# Patient Record
Sex: Female | Born: 1950 | Race: White | Hispanic: No | Marital: Married | Smoking: Current some day smoker
Health system: Southern US, Community
[De-identification: ages and names within clinical notes are randomized; demographics above are authoritative.]

## PROBLEM LIST (undated history)

## (undated) DIAGNOSIS — Z9221 Personal history of antineoplastic chemotherapy: Secondary | ICD-10-CM

## (undated) DIAGNOSIS — F419 Anxiety disorder, unspecified: Secondary | ICD-10-CM

## (undated) DIAGNOSIS — C73 Malignant neoplasm of thyroid gland: Secondary | ICD-10-CM

## (undated) DIAGNOSIS — M1990 Unspecified osteoarthritis, unspecified site: Secondary | ICD-10-CM

## (undated) DIAGNOSIS — G4700 Insomnia, unspecified: Secondary | ICD-10-CM

## (undated) DIAGNOSIS — F32A Depression, unspecified: Secondary | ICD-10-CM

## (undated) DIAGNOSIS — F329 Major depressive disorder, single episode, unspecified: Secondary | ICD-10-CM

## (undated) DIAGNOSIS — F1010 Alcohol abuse, uncomplicated: Secondary | ICD-10-CM

## (undated) DIAGNOSIS — C439 Malignant melanoma of skin, unspecified: Secondary | ICD-10-CM

## (undated) DIAGNOSIS — E079 Disorder of thyroid, unspecified: Secondary | ICD-10-CM

## (undated) HISTORY — DX: Malignant melanoma of skin, unspecified: C43.9

## (undated) HISTORY — DX: Unspecified osteoarthritis, unspecified site: M19.90

## (undated) HISTORY — DX: Malignant neoplasm of thyroid gland: C73

## (undated) HISTORY — PX: OTHER SURGICAL HISTORY: SHX169

---

## 1978-12-03 DIAGNOSIS — C439 Malignant melanoma of skin, unspecified: Secondary | ICD-10-CM

## 1978-12-03 HISTORY — DX: Malignant melanoma of skin, unspecified: C43.9

## 1978-12-03 HISTORY — PX: OTHER SURGICAL HISTORY: SHX169

## 1999-06-12 ENCOUNTER — Ambulatory Visit (HOSPITAL_COMMUNITY): Admission: RE | Admit: 1999-06-12 | Discharge: 1999-06-12 | Payer: Self-pay | Admitting: Psychiatry

## 1999-06-13 ENCOUNTER — Ambulatory Visit (HOSPITAL_COMMUNITY): Admission: RE | Admit: 1999-06-13 | Discharge: 1999-06-13 | Payer: Self-pay | Admitting: Psychiatry

## 1999-06-20 ENCOUNTER — Ambulatory Visit (HOSPITAL_COMMUNITY): Admission: RE | Admit: 1999-06-20 | Discharge: 1999-06-20 | Payer: Self-pay | Admitting: Psychiatry

## 1999-06-29 ENCOUNTER — Ambulatory Visit (HOSPITAL_COMMUNITY): Admission: RE | Admit: 1999-06-29 | Discharge: 1999-06-29 | Payer: Self-pay | Admitting: Psychiatry

## 1999-10-12 ENCOUNTER — Ambulatory Visit (HOSPITAL_COMMUNITY): Admission: RE | Admit: 1999-10-12 | Discharge: 1999-10-12 | Payer: Self-pay | Admitting: Psychiatry

## 1999-12-18 ENCOUNTER — Ambulatory Visit (HOSPITAL_COMMUNITY): Admission: RE | Admit: 1999-12-18 | Discharge: 1999-12-18 | Payer: Self-pay | Admitting: Psychiatry

## 2000-01-03 ENCOUNTER — Ambulatory Visit (HOSPITAL_COMMUNITY): Admission: RE | Admit: 2000-01-03 | Discharge: 2000-01-03 | Payer: Self-pay | Admitting: Psychiatry

## 2000-01-11 ENCOUNTER — Ambulatory Visit (HOSPITAL_COMMUNITY): Admission: RE | Admit: 2000-01-11 | Discharge: 2000-01-11 | Payer: Self-pay | Admitting: Psychiatry

## 2000-01-18 ENCOUNTER — Ambulatory Visit (HOSPITAL_COMMUNITY): Admission: RE | Admit: 2000-01-18 | Discharge: 2000-01-18 | Payer: Self-pay | Admitting: Psychiatry

## 2001-10-02 ENCOUNTER — Encounter: Admission: RE | Admit: 2001-10-02 | Discharge: 2001-10-02 | Payer: Self-pay | Admitting: Psychiatry

## 2001-10-21 ENCOUNTER — Encounter: Admission: RE | Admit: 2001-10-21 | Discharge: 2001-10-21 | Payer: Self-pay | Admitting: Psychiatry

## 2001-11-18 ENCOUNTER — Encounter: Admission: RE | Admit: 2001-11-18 | Discharge: 2001-11-18 | Payer: Self-pay | Admitting: Psychiatry

## 2002-03-10 ENCOUNTER — Encounter: Admission: RE | Admit: 2002-03-10 | Discharge: 2002-03-10 | Payer: Self-pay | Admitting: Psychiatry

## 2002-04-16 ENCOUNTER — Encounter: Admission: RE | Admit: 2002-04-16 | Discharge: 2002-04-16 | Payer: Self-pay | Admitting: Psychiatry

## 2002-04-22 ENCOUNTER — Encounter: Admission: RE | Admit: 2002-04-22 | Discharge: 2002-04-22 | Payer: Self-pay | Admitting: Psychiatry

## 2002-05-05 ENCOUNTER — Encounter: Admission: RE | Admit: 2002-05-05 | Discharge: 2002-05-05 | Payer: Self-pay | Admitting: Psychiatry

## 2002-06-29 ENCOUNTER — Encounter: Admission: RE | Admit: 2002-06-29 | Discharge: 2002-06-29 | Payer: Self-pay | Admitting: Psychiatry

## 2002-07-29 ENCOUNTER — Encounter: Admission: RE | Admit: 2002-07-29 | Discharge: 2002-07-29 | Payer: Self-pay | Admitting: Psychiatry

## 2002-09-24 ENCOUNTER — Encounter: Admission: RE | Admit: 2002-09-24 | Discharge: 2002-09-24 | Payer: Self-pay | Admitting: Psychiatry

## 2002-09-30 ENCOUNTER — Encounter: Admission: RE | Admit: 2002-09-30 | Discharge: 2002-09-30 | Payer: Self-pay | Admitting: Psychiatry

## 2002-10-19 ENCOUNTER — Encounter: Admission: RE | Admit: 2002-10-19 | Discharge: 2002-10-19 | Payer: Self-pay | Admitting: Psychiatry

## 2002-11-24 ENCOUNTER — Encounter: Admission: RE | Admit: 2002-11-24 | Discharge: 2002-11-24 | Payer: Self-pay | Admitting: Psychiatry

## 2002-12-14 ENCOUNTER — Encounter: Admission: RE | Admit: 2002-12-14 | Discharge: 2002-12-14 | Payer: Self-pay | Admitting: Psychiatry

## 2003-02-04 ENCOUNTER — Encounter: Admission: RE | Admit: 2003-02-04 | Discharge: 2003-02-04 | Payer: Self-pay | Admitting: Psychiatry

## 2003-03-09 ENCOUNTER — Encounter: Admission: RE | Admit: 2003-03-09 | Discharge: 2003-03-09 | Payer: Self-pay | Admitting: Psychiatry

## 2003-03-18 ENCOUNTER — Encounter: Admission: RE | Admit: 2003-03-18 | Discharge: 2003-03-18 | Payer: Self-pay | Admitting: Psychiatry

## 2003-04-07 ENCOUNTER — Encounter: Admission: RE | Admit: 2003-04-07 | Discharge: 2003-04-07 | Payer: Self-pay | Admitting: Psychiatry

## 2003-06-01 ENCOUNTER — Encounter: Admission: RE | Admit: 2003-06-01 | Discharge: 2003-06-01 | Payer: Self-pay | Admitting: Psychiatry

## 2003-07-07 ENCOUNTER — Encounter: Admission: RE | Admit: 2003-07-07 | Discharge: 2003-07-07 | Payer: Self-pay | Admitting: Psychiatry

## 2003-12-21 ENCOUNTER — Encounter: Admission: RE | Admit: 2003-12-21 | Discharge: 2003-12-21 | Payer: Self-pay | Admitting: Psychiatry

## 2004-08-28 ENCOUNTER — Ambulatory Visit (HOSPITAL_COMMUNITY): Payer: Self-pay | Admitting: Psychiatry

## 2004-09-04 ENCOUNTER — Ambulatory Visit (HOSPITAL_COMMUNITY): Payer: Self-pay | Admitting: Psychiatry

## 2004-09-11 ENCOUNTER — Ambulatory Visit (HOSPITAL_COMMUNITY): Payer: Self-pay | Admitting: Psychiatry

## 2004-09-18 ENCOUNTER — Ambulatory Visit (HOSPITAL_COMMUNITY): Payer: Self-pay | Admitting: Psychiatry

## 2004-09-25 ENCOUNTER — Ambulatory Visit (HOSPITAL_COMMUNITY): Payer: Self-pay | Admitting: Psychiatry

## 2004-10-16 ENCOUNTER — Ambulatory Visit (HOSPITAL_COMMUNITY): Payer: Self-pay | Admitting: Psychiatry

## 2004-10-23 ENCOUNTER — Ambulatory Visit (HOSPITAL_COMMUNITY): Payer: Self-pay | Admitting: Psychiatry

## 2004-11-13 ENCOUNTER — Ambulatory Visit (HOSPITAL_COMMUNITY): Payer: Self-pay | Admitting: Psychiatry

## 2005-02-12 ENCOUNTER — Ambulatory Visit (HOSPITAL_COMMUNITY): Payer: Self-pay | Admitting: Psychiatry

## 2005-04-17 ENCOUNTER — Ambulatory Visit (HOSPITAL_COMMUNITY): Payer: Self-pay | Admitting: Psychiatry

## 2005-09-04 ENCOUNTER — Ambulatory Visit (HOSPITAL_COMMUNITY): Payer: Self-pay | Admitting: Psychiatry

## 2005-09-07 ENCOUNTER — Ambulatory Visit (HOSPITAL_COMMUNITY): Payer: Self-pay | Admitting: Psychiatry

## 2005-09-11 ENCOUNTER — Ambulatory Visit (HOSPITAL_COMMUNITY): Payer: Self-pay | Admitting: Psychiatry

## 2005-09-17 ENCOUNTER — Ambulatory Visit (HOSPITAL_COMMUNITY): Payer: Self-pay | Admitting: Psychiatry

## 2005-12-13 ENCOUNTER — Ambulatory Visit (HOSPITAL_COMMUNITY): Payer: Self-pay | Admitting: Psychiatry

## 2005-12-20 ENCOUNTER — Ambulatory Visit (HOSPITAL_COMMUNITY): Payer: Self-pay | Admitting: Psychiatry

## 2005-12-25 ENCOUNTER — Ambulatory Visit (HOSPITAL_COMMUNITY): Payer: Self-pay | Admitting: Psychiatry

## 2006-01-02 ENCOUNTER — Ambulatory Visit (HOSPITAL_COMMUNITY): Payer: Self-pay | Admitting: Psychiatry

## 2006-03-06 ENCOUNTER — Ambulatory Visit (HOSPITAL_COMMUNITY): Payer: Self-pay | Admitting: Psychiatry

## 2006-03-19 ENCOUNTER — Ambulatory Visit (HOSPITAL_COMMUNITY): Payer: Self-pay | Admitting: Psychiatry

## 2006-07-18 ENCOUNTER — Ambulatory Visit (HOSPITAL_COMMUNITY): Payer: Self-pay | Admitting: Psychiatry

## 2006-09-11 ENCOUNTER — Ambulatory Visit (HOSPITAL_COMMUNITY): Payer: Self-pay | Admitting: Psychiatry

## 2006-12-11 ENCOUNTER — Ambulatory Visit (HOSPITAL_COMMUNITY): Payer: Self-pay | Admitting: Psychiatry

## 2006-12-30 ENCOUNTER — Ambulatory Visit (HOSPITAL_COMMUNITY): Payer: Self-pay | Admitting: Psychiatry

## 2007-03-27 ENCOUNTER — Ambulatory Visit (HOSPITAL_COMMUNITY): Payer: Self-pay | Admitting: Psychiatry

## 2007-05-02 ENCOUNTER — Ambulatory Visit (HOSPITAL_COMMUNITY): Payer: Self-pay | Admitting: Psychiatry

## 2007-10-28 ENCOUNTER — Ambulatory Visit (HOSPITAL_COMMUNITY): Payer: Self-pay | Admitting: Psychiatry

## 2007-11-25 ENCOUNTER — Ambulatory Visit (HOSPITAL_COMMUNITY): Payer: Self-pay | Admitting: Psychiatry

## 2008-02-18 ENCOUNTER — Ambulatory Visit (HOSPITAL_COMMUNITY): Payer: Self-pay | Admitting: Psychiatry

## 2008-03-01 ENCOUNTER — Ambulatory Visit (HOSPITAL_COMMUNITY): Payer: Self-pay | Admitting: Licensed Clinical Social Worker

## 2008-03-16 ENCOUNTER — Ambulatory Visit (HOSPITAL_COMMUNITY): Payer: Self-pay | Admitting: Licensed Clinical Social Worker

## 2008-03-29 ENCOUNTER — Ambulatory Visit (HOSPITAL_COMMUNITY): Payer: Self-pay | Admitting: Licensed Clinical Social Worker

## 2008-04-05 ENCOUNTER — Ambulatory Visit (HOSPITAL_COMMUNITY): Payer: Self-pay | Admitting: Psychiatry

## 2008-04-13 ENCOUNTER — Ambulatory Visit (HOSPITAL_COMMUNITY): Payer: Self-pay | Admitting: Licensed Clinical Social Worker

## 2008-05-05 ENCOUNTER — Ambulatory Visit (HOSPITAL_COMMUNITY): Payer: Self-pay | Admitting: Licensed Clinical Social Worker

## 2008-05-11 ENCOUNTER — Ambulatory Visit (HOSPITAL_COMMUNITY): Payer: Self-pay | Admitting: Licensed Clinical Social Worker

## 2008-05-25 ENCOUNTER — Ambulatory Visit (HOSPITAL_COMMUNITY): Payer: Self-pay | Admitting: Licensed Clinical Social Worker

## 2008-06-09 ENCOUNTER — Ambulatory Visit (HOSPITAL_COMMUNITY): Payer: Self-pay | Admitting: Psychiatry

## 2008-06-23 ENCOUNTER — Ambulatory Visit (HOSPITAL_COMMUNITY): Payer: Self-pay | Admitting: Licensed Clinical Social Worker

## 2008-06-25 ENCOUNTER — Ambulatory Visit (HOSPITAL_COMMUNITY): Payer: Self-pay | Admitting: Psychiatry

## 2008-07-05 ENCOUNTER — Ambulatory Visit (HOSPITAL_COMMUNITY): Payer: Self-pay | Admitting: Licensed Clinical Social Worker

## 2008-07-19 ENCOUNTER — Ambulatory Visit (HOSPITAL_COMMUNITY): Payer: Self-pay | Admitting: Licensed Clinical Social Worker

## 2008-07-28 ENCOUNTER — Ambulatory Visit (HOSPITAL_COMMUNITY): Payer: Self-pay | Admitting: Licensed Clinical Social Worker

## 2008-08-11 ENCOUNTER — Ambulatory Visit (HOSPITAL_COMMUNITY): Payer: Self-pay | Admitting: Licensed Clinical Social Worker

## 2008-09-01 ENCOUNTER — Ambulatory Visit (HOSPITAL_COMMUNITY): Payer: Self-pay | Admitting: Licensed Clinical Social Worker

## 2008-09-20 ENCOUNTER — Ambulatory Visit (HOSPITAL_COMMUNITY): Payer: Self-pay | Admitting: Licensed Clinical Social Worker

## 2008-10-20 ENCOUNTER — Ambulatory Visit (HOSPITAL_COMMUNITY): Payer: Self-pay | Admitting: Licensed Clinical Social Worker

## 2008-11-03 ENCOUNTER — Ambulatory Visit (HOSPITAL_COMMUNITY): Payer: Self-pay | Admitting: Licensed Clinical Social Worker

## 2011-06-07 ENCOUNTER — Ambulatory Visit (INDEPENDENT_AMBULATORY_CARE_PROVIDER_SITE_OTHER): Payer: BC Managed Care – PPO | Admitting: General Surgery

## 2011-06-07 ENCOUNTER — Encounter (INDEPENDENT_AMBULATORY_CARE_PROVIDER_SITE_OTHER): Payer: Self-pay | Admitting: General Surgery

## 2011-06-07 VITALS — BP 114/62 | HR 70 | Ht 65.0 in | Wt 140.0 lb

## 2011-06-07 DIAGNOSIS — R229 Localized swelling, mass and lump, unspecified: Secondary | ICD-10-CM

## 2011-06-07 DIAGNOSIS — R222 Localized swelling, mass and lump, trunk: Secondary | ICD-10-CM | POA: Insufficient documentation

## 2011-06-07 NOTE — Patient Instructions (Signed)
We will schedule for surgery tomorrow with Dr. [name]

## 2011-06-07 NOTE — Progress Notes (Signed)
Subjective:     Patient ID: Susan Mccarthy, female   DOB: 07/05/1951, 60 y.o.   MRN: [id number]    BP 114/62  Pulse 70  Ht 5\' 5"  (1.651 m)  Wt 140 lb (63.504 kg)  BMI 23.30 kg/m2    HPI This is a 60 year old female with a history of a back melanoma in 1980. She has a history she reports of a fungal infection on her back it has been treated. She has a month's history of a mass on her back that has been increasingly irritating to her. She notes some material that comes off of this is in her bowel as well as on her clothes. Lately she been having some bleeding from this area. This was hit by her dog recently also it is causing her pain since then. She was evaluated by Dr. Leta Speller yesterday and he noted this to be a very friable large mass not really amenable to a biopsy that just needed to be addressed surgically. There is no real relieving factors. This area soaked through a bandage the Dr. Londell Moh the office put on yesterday already. Past Medical History  Diagnosis Date  . Melanoma 1980    Past Surgical History  Procedure Date  . Excision of melanoma 1980   Current outpatient prescriptions:cetirizine (ZYRTEC) 5 MG tablet, Take 5 mg by mouth daily.  , Disp: , Rfl: ;  ibuprofen (ADVIL,MOTRIN) 100 MG chewable tablet, Chew 100 mg by mouth every 8 (eight) hours as needed.  , Disp: , Rfl:   No Known Allergies   Review of Systems  Constitutional: Negative.   HENT: Negative.   Eyes: Negative.   Respiratory: Negative.   Cardiovascular: Negative.   Gastrointestinal: Negative.   Genitourinary: Negative.   Musculoskeletal: Negative.   Neurological: Positive for speech difficulty.  Hematological: Positive for adenopathy.       Adenopathy in right axilla with swelling  Psychiatric/Behavioral: Negative.        Objective:   Physical Exam  Constitutional: She appears well-developed and well-nourished.  Neck: Neck supple.  Cardiovascular: Normal rate, regular rhythm and normal heart  sounds.   Pulmonary/Chest: Effort normal and breath sounds normal. She has no wheezes. She has no rales.       Palpable mobile nontender right axillary adenopathy, no left axillary adenopathy palpated.  She has a 2x3 inch necrotic bleeding back mass with some surrounding erythema.  Lymphadenopathy:    She has no cervical adenopathy.       Assessment:     Back mass, concern for tumor Right axillary lymphadenopathy    Plan:        She has a large necrotic back mass that is currently bleeding mildly. It is better with a dressing on it but I don't think that this is going to be able to wait to go to the operating room. I'm concerned about what this is as we have no tissue diagnosis as well as I'm concerned about her axillary adenopathy. I think that's going to have to be evaluated at a later date. She really urgently in the next 24 hours needs to go to the operating room at this mass excised given the symptoms and its appearance right now. I'm unable to do this so I had Dr. [name] come see her today and he is going to plan on doing this tomorrow morning. I discussed with her an excision of this mass likely with a primary closure. Based on the pathology of this she we  will then proceed with evaluating her axilla as well as any other further treatment since she might need.

## 2011-06-08 ENCOUNTER — Other Ambulatory Visit (INDEPENDENT_AMBULATORY_CARE_PROVIDER_SITE_OTHER): Payer: Self-pay | Admitting: Surgery

## 2011-06-08 ENCOUNTER — Ambulatory Visit (HOSPITAL_COMMUNITY)
Admission: RE | Admit: 2011-06-08 | Discharge: 2011-06-08 | Disposition: A | Discharge status: Home / Self Care | Payer: BC Managed Care – PPO | Source: Ambulatory Visit | Attending: Surgery | Admitting: Surgery

## 2011-06-08 ENCOUNTER — Ambulatory Visit (HOSPITAL_COMMUNITY): Payer: BC Managed Care – PPO

## 2011-06-08 DIAGNOSIS — Z01818 Encounter for other preprocedural examination: Secondary | ICD-10-CM | POA: Insufficient documentation

## 2011-06-08 DIAGNOSIS — C4359 Malignant melanoma of other part of trunk: Secondary | ICD-10-CM | POA: Insufficient documentation

## 2011-06-08 DIAGNOSIS — R599 Enlarged lymph nodes, unspecified: Secondary | ICD-10-CM | POA: Insufficient documentation

## 2011-06-08 LAB — BASIC METABOLIC PANEL
Calcium: 9.7 mg/dL (ref 8.4–10.5)
Creatinine, Ser: 0.59 mg/dL (ref 0.50–1.10)
GFR calc Af Amer: >60 mL/min (ref >60)

## 2011-06-08 LAB — CBC
MCH: 30.6 pg (ref 26.0–34.0)
MCV: 89.7 fL (ref 78.0–100.0)
Platelets: 205 10*3/uL (ref 150–400)
RDW: 12.8 % (ref 11.5–15.5)
WBC: 5.5 10*3/uL (ref 4.0–10.5)

## 2011-06-08 LAB — DIFFERENTIAL
Eosinophils Absolute: 0.2 10*3/uL (ref 0.0–0.7)
Eosinophils Relative: 4 % (ref 0–5)
Lymphs Abs: 1 10*3/uL (ref 0.7–4.0)
Monocytes Relative: 13 % — ABNORMAL HIGH (ref 3–12)

## 2011-06-08 LAB — SURGICAL PCR SCREEN
MRSA, PCR: NEGATIVE
Staphylococcus aureus: NEGATIVE

## 2011-06-11 NOTE — Op Note (Signed)
Susan Mccarthy, Susan Mccarthy NO.:  [id number]  MEDICAL RECORD NO.:  [id number]  LOCATION:  [facility]                         FACILITY:  [facility]  PHYSICIAN:  Currie Paris, M.D.DATE OF BIRTH:  21-Sep-1951  DATE OF PROCEDURE:  06/08/2011 DATE OF DISCHARGE:                              OPERATIVE REPORT   PREOPERATIVE DIAGNOSIS:  Fungating mass, mid right back, probable melanoma.  POSTOPERATIVE DIAGNOSIS:  Fungating mass, mid right back, probable melanoma.  PROCEDURE:  Excision, fungating mass of back, with layered closure (7-cm incision).  SURGEON:  Currie Paris, M.D.  ANESTHESIA:  General.  CLINICAL HISTORY:  This is a 60 year old lady who was sent to our office yesterday by Dr. Londell Moh with a large fungating mass on the back that had been bleeding and was such that she could no longer lie on it. There was a mass about 5 x 3 cm, raised up about 3 cm from the skin. There was a small similar looking nodule that was about 5 to 7 mm in size.  There is a flat black area just below this, suggestive of melanoma.  The mass was somewhat pedunculated.  Because of the ongoing bleeding and discomfort, we elected to go ahead and excise this primarily.  There was a several centimeter area of redness around it that I was concerned, represented melanoma, as was Dr. [name].  However, we thought that a palliative resection, give [place] the opportunity get a pathology report would allow [place] to make more definitive plans once we had gotten the median symptomatology relieved. Of note is that she had what felt like a metastatic disease in her right axilla.  DESCRIPTION OF PROCEDURE:  I saw the patient in the holding area and she had no further questions.  I marked the area in question.  The patient was taken to the operating room and after satisfactory general anesthesia had been obtained, she was placed on the operating room table in the prone position and the area around  the mass was prepped and draped.  A time-out was done.  I made a long elliptical incision, trying to get around the mass.  As noted, it was somewhat pediculated and the base was a little bit narrower but I ended up taking an area of skin about 3 cm wide and 7 cm long.  I went down to the fascia and took this out basically with cautery after the skin incision was made with the knife.  I oriented the specimen with some sutures and sent it to pathology.  I spent several minutes making sure everything was dry.  I raised some skin flaps superiorly and inferiorly, so that we would take a little bit tension off the closure.  I then injected about 30 mL of 0.25% plain Marcaine to help with postop pain relief.  I then closed in layers with some 3-0 Vicryl interrupted to try to take the tension off and then 3-0 Prolene vertical mattress sutures for the skin.  Sterile dressings were applied.  The patient tolerated the procedure well.  There were no operative complications.  All counts were correct.  We will send her home from outpatient today and she will  be followed up by Dr. [name] in our office next week.     Currie Paris, M.D.     CJS/MEDQ  D:  06/08/2011  T:  06/08/2011  Job:  [id number]  cc:   Georgina Quint. Plotnikov, MD [address] [place] [address]  Norval Gable. [place], M.D. Fax: [phone]  Electronically Signed by Cyndia Bent M.D. on 06/11/2011 09:48:36 AM

## 2011-06-18 ENCOUNTER — Encounter (INDEPENDENT_AMBULATORY_CARE_PROVIDER_SITE_OTHER): Payer: BC Managed Care – PPO

## 2011-06-19 ENCOUNTER — Ambulatory Visit (INDEPENDENT_AMBULATORY_CARE_PROVIDER_SITE_OTHER): Payer: BC Managed Care – PPO | Admitting: General Surgery

## 2011-06-19 ENCOUNTER — Telehealth: Payer: Self-pay | Admitting: Internal Medicine

## 2011-06-19 DIAGNOSIS — C4359 Malignant melanoma of other part of trunk: Secondary | ICD-10-CM

## 2011-06-19 NOTE — Progress Notes (Signed)
Subjective:     Patient ID: Susan Mccarthy, female   DOB: Nov 07, 1951, 60 y.o.   MRN: [id number]  HPI This is a 60 year old female with a history of back melanoma in 1980. She presented to me in early July. She had a large fungating bleeding mass on her back at that point. She was taken to the operating room the following day by Dr. [name]. She just done well since then without any complaints from the operation. She does however have a 6 cm superficial spreading malignant melanoma with a 1.1 cm macro satellite nodule. There is a few lymphatic invasion identified in the morning this is 3 mm inferiorly. She also had palpable right axillary adenopathy on my initial examination it has not been evaluated at this point either.  Review of Systems     Objective:   Physical Exam Well healing transverse back incision s/p surgery by Dr. 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 I discussed with her I think the next step would be staging her completely. Her margins are not adequate for a T4 melanoma. She also has palpable axillary adenopathy which needs to be evaluated. I think the next best step is to be sure she does not have metastatic disease. I discussed this with Dr. [name]. He is going to see her this week. I ordered a PET scan as well as an MRI of the brain per his recommendations for baseline staging. She also inquired about going back to [facility] for evaluation and a possible second opinion. I told her that I thought that I was perfectly reasonable as well as to discuss any trials for which he might be a candidate for. On the week here what her response is on this. I will hold on any further surgery at this point until we have the staging and a medical oncology opinion first.

## 2011-06-19 NOTE — Telephone Encounter (Signed)
Susan Mccarthy called to say his wife is using Dr. Loren Racer name as a pcp with the hospital and her cancer doctors.  He says she used to see Dr. Posey Rea, but has not been seen since before 2005 (no appts in [facility] or [facility])  Susan Mccarthy wants her to be re-established with Dr. Posey Rea.  She has [employer].  Will you take her as a patient?

## 2011-06-19 NOTE — Telephone Encounter (Signed)
OK. Thx

## 2011-06-20 ENCOUNTER — Ambulatory Visit: Payer: BC Managed Care – PPO | Admitting: Hematology & Oncology

## 2011-06-21 ENCOUNTER — Other Ambulatory Visit: Payer: Self-pay | Admitting: Hematology & Oncology

## 2011-06-21 ENCOUNTER — Ambulatory Visit (HOSPITAL_BASED_OUTPATIENT_CLINIC_OR_DEPARTMENT_OTHER): Payer: BC Managed Care – PPO | Admitting: Hematology & Oncology

## 2011-06-21 ENCOUNTER — Telehealth (INDEPENDENT_AMBULATORY_CARE_PROVIDER_SITE_OTHER): Payer: Self-pay

## 2011-06-21 ENCOUNTER — Encounter (HOSPITAL_COMMUNITY): Payer: Self-pay

## 2011-06-21 ENCOUNTER — Encounter (HOSPITAL_COMMUNITY)
Admission: RE | Admit: 2011-06-21 | Discharge: 2011-06-21 | Disposition: A | Discharge status: Home / Self Care | Payer: BC Managed Care – PPO | Source: Ambulatory Visit | Attending: General Surgery | Admitting: General Surgery

## 2011-06-21 DIAGNOSIS — C4359 Malignant melanoma of other part of trunk: Secondary | ICD-10-CM

## 2011-06-21 LAB — COMPREHENSIVE METABOLIC PANEL
ALT: 21 U/L (ref 0–35)
AST: 23 U/L (ref 0–37)
Calcium: 9.6 mg/dL (ref 8.4–10.5)
Chloride: 98 mEq/L (ref 96–112)
Creatinine, Ser: 0.69 mg/dL (ref 0.50–1.10)
Sodium: 132 mEq/L — ABNORMAL LOW (ref 135–145)
Total Protein: 6.8 g/dL (ref 6.0–8.3)

## 2011-06-21 LAB — CBC WITH DIFFERENTIAL (CANCER CENTER ONLY)
BASO%: 0.9 % (ref 0.0–2.0)
Eosinophils Absolute: 0.3 10*3/uL (ref 0.0–0.5)
LYMPH#: 1.3 10*3/uL (ref 0.9–3.3)
LYMPH%: 23.2 % (ref 14.0–48.0)
MCV: 88 fL (ref 81–101)
MONO#: 0.7 10*3/uL (ref 0.1–0.9)
NEUT#: 3.4 10*3/uL (ref 1.5–6.5)
Platelets: 228 10*3/uL (ref 145–400)
RBC: 4.56 10*6/uL (ref 3.70–5.32)
RDW: 12.6 % (ref 11.1–15.7)
WBC: 5.7 10*3/uL (ref 3.9–10.0)

## 2011-06-21 LAB — GLUCOSE, CAPILLARY: Glucose-Capillary: 120 mg/dL — ABNORMAL HIGH (ref 70–99)

## 2011-06-21 MED ORDER — FLUDEOXYGLUCOSE F - 18 (FDG) INJECTION
17.5000 | Freq: Once | INTRAVENOUS | Status: AC | PRN
  Administered 2011-06-21: 17.5 via INTRAVENOUS

## 2011-06-21 NOTE — Telephone Encounter (Signed)
Pt calling to request Rx for Valium 5mg  #1 to take before her MRI Brain scan on 06-22-11. Per DR [name] to call Valium 5mg  #1 take 1hr before xray and Do Not Drive on Valium phoned to [phone] per Dr [name] [facility] 06-21-11

## 2011-06-22 ENCOUNTER — Telehealth (INDEPENDENT_AMBULATORY_CARE_PROVIDER_SITE_OTHER): Payer: Self-pay

## 2011-06-22 ENCOUNTER — Ambulatory Visit (HOSPITAL_COMMUNITY)
Admission: RE | Admit: 2011-06-22 | Discharge: 2011-06-22 | Disposition: A | Discharge status: Home / Self Care | Payer: BC Managed Care – PPO | Source: Ambulatory Visit | Attending: General Surgery | Admitting: General Surgery

## 2011-06-22 DIAGNOSIS — C4359 Malignant melanoma of other part of trunk: Secondary | ICD-10-CM | POA: Insufficient documentation

## 2011-06-22 MED ORDER — GADOBENATE DIMEGLUMINE 529 MG/ML IV SOLN
13.0000 mL | Freq: Once | INTRAVENOUS | Status: AC
  Administered 2011-06-22: 13 mL via INTRAVENOUS

## 2011-06-22 NOTE — Telephone Encounter (Signed)
I received pt's xray reports on the PET scan and the MRI Brain scan so I faxed them to Dr Gustavo Lah office this pm/ [facility]

## 2011-06-29 ENCOUNTER — Telehealth (INDEPENDENT_AMBULATORY_CARE_PROVIDER_SITE_OTHER): Payer: Self-pay

## 2011-06-29 NOTE — Telephone Encounter (Signed)
I called Dr Gustavo Lah office to see what the plan was going to be for our pt with her care. The pt has decided to get a 2nd opinion @ [facility] so Ellen-Dr Ennever's nurse made the referral to Dr [name] @[facility] ./ [facility]

## 2011-07-09 ENCOUNTER — Encounter (INDEPENDENT_AMBULATORY_CARE_PROVIDER_SITE_OTHER): Payer: BC Managed Care – PPO | Admitting: General Surgery

## 2011-07-19 NOTE — Telephone Encounter (Signed)
Pt is aware. She has an appt for Sept. 13, 2012.

## 2011-08-02 HISTORY — PX: AXILLARY NODE DISSECTION: SHX1211

## 2011-08-16 ENCOUNTER — Telehealth: Payer: Self-pay | Admitting: Internal Medicine

## 2011-08-16 ENCOUNTER — Other Ambulatory Visit (INDEPENDENT_AMBULATORY_CARE_PROVIDER_SITE_OTHER): Payer: BC Managed Care – PPO

## 2011-08-16 ENCOUNTER — Ambulatory Visit (INDEPENDENT_AMBULATORY_CARE_PROVIDER_SITE_OTHER): Payer: BC Managed Care – PPO | Admitting: Internal Medicine

## 2011-08-16 ENCOUNTER — Encounter: Payer: Self-pay | Admitting: Internal Medicine

## 2011-08-16 VITALS — BP 122/80 | HR 88 | Temp 98.8°F | Resp 16 | Ht 65.0 in | Wt 139.0 lb

## 2011-08-16 DIAGNOSIS — C4359 Malignant melanoma of other part of trunk: Secondary | ICD-10-CM

## 2011-08-16 DIAGNOSIS — R071 Chest pain on breathing: Secondary | ICD-10-CM

## 2011-08-16 DIAGNOSIS — R0789 Other chest pain: Secondary | ICD-10-CM

## 2011-08-16 DIAGNOSIS — F329 Major depressive disorder, single episode, unspecified: Secondary | ICD-10-CM

## 2011-08-16 DIAGNOSIS — F32A Depression, unspecified: Secondary | ICD-10-CM

## 2011-08-16 DIAGNOSIS — F419 Anxiety disorder, unspecified: Secondary | ICD-10-CM

## 2011-08-16 DIAGNOSIS — G4700 Insomnia, unspecified: Secondary | ICD-10-CM

## 2011-08-16 DIAGNOSIS — F411 Generalized anxiety disorder: Secondary | ICD-10-CM

## 2011-08-16 LAB — CBC WITH DIFFERENTIAL/PLATELET
Basophils Absolute: 0 10*3/uL (ref 0.0–0.1)
Eosinophils Absolute: 0.2 10*3/uL (ref 0.0–0.7)
HCT: 42.1 % (ref 36.0–46.0)
Hemoglobin: 14.3 g/dL (ref 12.0–15.0)
Lymphocytes Relative: 21.3 % (ref 12.0–46.0)
Lymphs Abs: 1.5 10*3/uL (ref 0.7–4.0)
MCHC: 33.9 g/dL (ref 30.0–36.0)
Neutro Abs: 4.9 10*3/uL (ref 1.4–7.7)
Platelets: 218 10*3/uL (ref 150.0–400.0)
RDW: 14.1 % (ref 11.5–14.6)

## 2011-08-16 LAB — URINALYSIS
Bilirubin Urine: NEGATIVE
Hgb urine dipstick: NEGATIVE
Leukocytes, UA: NEGATIVE
Nitrite: NEGATIVE
Specific Gravity, Urine: 1.01
Total Protein, Urine: NEGATIVE
Urine Glucose: NEGATIVE
Urobilinogen, UA: 0.2
pH: 7 (ref 5.0–8.0)

## 2011-08-16 LAB — COMPREHENSIVE METABOLIC PANEL WITH GFR
ALT: 15 U/L (ref 0–35)
AST: 20 U/L (ref 0–37)
Albumin: 4.1 g/dL (ref 3.5–5.2)
Alkaline Phosphatase: 83 U/L (ref 39–117)
BUN: 13 mg/dL (ref 6–23)
CO2: 27 meq/L (ref 19–32)
Calcium: 9.5 mg/dL (ref 8.4–10.5)
Chloride: 102 meq/L (ref 96–112)
Creatinine, Ser: 0.7 mg/dL (ref 0.4–1.2)
GFR: 91.46 mL/min
Glucose, Bld: 122 mg/dL — ABNORMAL HIGH (ref 70–99)
Potassium: 4.9 meq/L (ref 3.5–5.1)
Sodium: 137 meq/L (ref 135–145)
Total Bilirubin: 0.7 mg/dL (ref 0.3–1.2)
Total Protein: 7 g/dL (ref 6.0–8.3)

## 2011-08-16 LAB — VITAMIN B12: Vitamin B-12: 150 pg/mL — ABNORMAL LOW (ref 211–911)

## 2011-08-16 MED ORDER — ALPRAZOLAM 0.25 MG PO TABS: 0.2500 mg | ORAL_TABLET | Freq: Two times a day (BID) | ORAL | Status: DC | PRN

## 2011-08-16 MED ORDER — VITAMIN D 1000 UNITS PO TABS: 1000.0000 [IU] | ORAL_TABLET | Freq: Every day | ORAL | Status: AC

## 2011-08-16 MED ORDER — FLUOXETINE HCL 20 MG PO CAPS: 20.0000 mg | ORAL_CAPSULE | Freq: Every day | ORAL | Status: DC

## 2011-08-16 NOTE — Assessment & Plan Note (Addendum)
S/p surg 06/09/11, 07/28/11 Get labs

## 2011-08-16 NOTE — Assessment & Plan Note (Signed)
Xanax prn 

## 2011-08-16 NOTE — Progress Notes (Signed)
  Subjective:    Patient ID: Susan Mccarthy, female    DOB: [birth year], 60 y.o.   MRN: [id number]  HPI  New pt C/o skin ca - melanoma dx'd in 7/12 C/o anxiety, depression - severe. C/o stress at home. C/o insomnia - worse lately. C/o R axilla pain 10/10 w/activity irrad to front and helped w/oxycod  Review of Systems  Constitutional: Positive for unexpected weight change (wt gain). Negative for chills, activity change, appetite change and fatigue.  HENT: Negative for congestion, mouth sores and sinus pressure.   Eyes: Negative for visual disturbance.  Respiratory: Negative for cough and chest tightness.   Gastrointestinal: Negative for nausea and abdominal pain.  Genitourinary: Negative for frequency, difficulty urinating and vaginal pain.  Musculoskeletal: Negative for back pain and gait problem.  Skin: Positive for wound (R axilla drain). Negative for pallor and rash.  Neurological: Negative for dizziness, tremors, weakness, numbness and headaches.  Psychiatric/Behavioral: Positive for behavioral problems and agitation. Negative for suicidal ideas, confusion, sleep disturbance and self-injury. The patient is nervous/anxious.        Objective:   Physical Exam  Constitutional: She appears well-developed and well-nourished. No distress.  HENT:  Head: Normocephalic.  Right Ear: External ear normal.  Left Ear: External ear normal.  Nose: Nose normal.  Mouth/Throat: Oropharynx is clear and moist.  Eyes: Conjunctivae are normal. Pupils are equal, round, and reactive to light. Right eye exhibits no discharge. Left eye exhibits no discharge.  Neck: Normal range of motion. Neck supple. No JVD present. No tracheal deviation present. No thyromegaly present.  Cardiovascular: Normal rate, regular rhythm and normal heart sounds.   Pulmonary/Chest: No stridor. No respiratory distress. She has no wheezes.  Abdominal: Soft. Bowel sounds are normal. She exhibits no distension and no mass. There is  no tenderness. There is no rebound and no guarding.  Musculoskeletal: She exhibits tenderness. She exhibits no edema.  Lymphadenopathy:    She has no cervical adenopathy.  Neurological: She displays normal reflexes. No cranial nerve deficit. She exhibits normal muscle tone. Coordination normal.  Skin: No rash noted. No erythema.       R axilla wound and drain in place  Psychiatric: Her behavior is normal. Judgment and thought content normal.       Tearful depressed   R upper breast is tender - no redness Lab Results  Component Value Date   WBC 5.7 06/21/2011   HGB 14.3 06/21/2011   HCT 40.1 06/21/2011   PLT 228 06/21/2011   ALT 21 06/21/2011   ALT 21 06/21/2011   AST 23 06/21/2011   AST 23 06/21/2011   NA 132* 06/21/2011   NA 132* 06/21/2011   K 4.2 06/21/2011   K 4.2 06/21/2011   CL 98 06/21/2011   CL 98 06/21/2011   CREATININE 0.69 06/21/2011   CREATININE 0.69 06/21/2011   BUN 14 06/21/2011   BUN 14 06/21/2011   CO2 23 06/21/2011   CO2 23 06/21/2011     Hosp records, PET, MRI, labs reviewed    Assessment & Plan:    A complex case.Marland KitchenMarland Kitchen

## 2011-08-16 NOTE — Telephone Encounter (Signed)
Susan Mccarthy, please, inform patient that all labs are normal except for low Vit B12 (Vit D is pending). OV tomorrow or Mon to start on shots Thx

## 2011-08-16 NOTE — Assessment & Plan Note (Signed)
Start fluoxetine prescription therapy as reflected on the Med list.

## 2011-08-17 ENCOUNTER — Telehealth: Payer: Self-pay | Admitting: Internal Medicine

## 2011-08-17 LAB — VITAMIN D 25 HYDROXY (VIT D DEFICIENCY, FRACTURES): Vit D, 25-Hydroxy: 25 ng/mL — ABNORMAL LOW (ref 30–89)

## 2011-08-17 MED ORDER — ERGOCALCIFEROL 1.25 MG (50000 UT) PO CAPS: 50000.0000 [IU] | ORAL_CAPSULE | ORAL | Status: DC

## 2011-08-17 NOTE — Telephone Encounter (Signed)
Pt informed. She states she has appts at [facility] on Monday and cant come but will call after her appt to schedule OV with [facility] then.

## 2011-08-17 NOTE — Telephone Encounter (Signed)
Left mess for patient to call back.  

## 2011-08-17 NOTE — Telephone Encounter (Signed)
Susan Mccarthy, please, inform patient that her Vit D was low too or just make sure she is coming soon for low B12 def treatment Thx

## 2011-08-23 ENCOUNTER — Ambulatory Visit (INDEPENDENT_AMBULATORY_CARE_PROVIDER_SITE_OTHER): Payer: BC Managed Care – PPO | Admitting: *Deleted

## 2011-08-23 DIAGNOSIS — E538 Deficiency of other specified B group vitamins: Secondary | ICD-10-CM

## 2011-08-23 MED ORDER — CYANOCOBALAMIN 1000 MCG/ML IJ SOLN
1000.0000 ug | Freq: Once | INTRAMUSCULAR | Status: AC
  Administered 2011-08-23: 1000 ug via INTRAMUSCULAR

## 2011-08-28 ENCOUNTER — Encounter (HOSPITAL_BASED_OUTPATIENT_CLINIC_OR_DEPARTMENT_OTHER): Payer: BC Managed Care – PPO | Admitting: Hematology & Oncology

## 2011-08-28 DIAGNOSIS — C4359 Malignant melanoma of other part of trunk: Secondary | ICD-10-CM

## 2011-09-07 ENCOUNTER — Ambulatory Visit (INDEPENDENT_AMBULATORY_CARE_PROVIDER_SITE_OTHER): Payer: BC Managed Care – PPO | Admitting: Internal Medicine

## 2011-09-07 ENCOUNTER — Encounter: Payer: Self-pay | Admitting: Internal Medicine

## 2011-09-07 DIAGNOSIS — C4359 Malignant melanoma of other part of trunk: Secondary | ICD-10-CM

## 2011-09-07 DIAGNOSIS — E538 Deficiency of other specified B group vitamins: Secondary | ICD-10-CM

## 2011-09-07 DIAGNOSIS — E559 Vitamin D deficiency, unspecified: Secondary | ICD-10-CM

## 2011-09-07 MED ORDER — "SYRINGE/NEEDLE (DISP) 30G X 1/2"" 1 ML MISC": 1.0000 | Status: DC

## 2011-09-07 MED ORDER — CYANOCOBALAMIN 1000 MCG/ML IJ SOLN
1000.0000 ug | Freq: Once | INTRAMUSCULAR | Status: AC
  Administered 2011-09-07: 1000 ug via INTRAMUSCULAR

## 2011-09-07 MED ORDER — CYANOCOBALAMIN 1000 MCG/ML IJ SOLN: 1000.0000 ug | Freq: Once | INTRAMUSCULAR | Status: DC

## 2011-09-07 MED ORDER — CLONAZEPAM 1 MG PO TABS: 1.0000 mg | ORAL_TABLET | Freq: Three times a day (TID) | ORAL | Status: DC | PRN

## 2011-09-10 ENCOUNTER — Encounter (HOSPITAL_BASED_OUTPATIENT_CLINIC_OR_DEPARTMENT_OTHER): Payer: BC Managed Care – PPO | Admitting: Hematology & Oncology

## 2011-09-10 DIAGNOSIS — Z5111 Encounter for antineoplastic chemotherapy: Secondary | ICD-10-CM

## 2011-09-10 DIAGNOSIS — C4359 Malignant melanoma of other part of trunk: Secondary | ICD-10-CM

## 2011-09-11 ENCOUNTER — Encounter (HOSPITAL_BASED_OUTPATIENT_CLINIC_OR_DEPARTMENT_OTHER): Payer: BC Managed Care – PPO | Admitting: Hematology & Oncology

## 2011-09-11 DIAGNOSIS — C4359 Malignant melanoma of other part of trunk: Secondary | ICD-10-CM

## 2011-09-11 DIAGNOSIS — Z5112 Encounter for antineoplastic immunotherapy: Secondary | ICD-10-CM

## 2011-09-12 ENCOUNTER — Encounter (HOSPITAL_BASED_OUTPATIENT_CLINIC_OR_DEPARTMENT_OTHER): Payer: BC Managed Care – PPO | Admitting: Hematology & Oncology

## 2011-09-12 DIAGNOSIS — Z5112 Encounter for antineoplastic immunotherapy: Secondary | ICD-10-CM

## 2011-09-12 DIAGNOSIS — C4359 Malignant melanoma of other part of trunk: Secondary | ICD-10-CM

## 2011-09-14 ENCOUNTER — Encounter (HOSPITAL_BASED_OUTPATIENT_CLINIC_OR_DEPARTMENT_OTHER): Payer: BC Managed Care – PPO | Admitting: Hematology & Oncology

## 2011-09-14 DIAGNOSIS — Z5112 Encounter for antineoplastic immunotherapy: Secondary | ICD-10-CM

## 2011-09-14 DIAGNOSIS — C4359 Malignant melanoma of other part of trunk: Secondary | ICD-10-CM

## 2011-09-17 ENCOUNTER — Other Ambulatory Visit: Payer: Self-pay | Admitting: Hematology & Oncology

## 2011-09-17 ENCOUNTER — Encounter (HOSPITAL_BASED_OUTPATIENT_CLINIC_OR_DEPARTMENT_OTHER): Payer: BC Managed Care – PPO | Admitting: Hematology & Oncology

## 2011-09-17 DIAGNOSIS — C4359 Malignant melanoma of other part of trunk: Secondary | ICD-10-CM

## 2011-09-17 DIAGNOSIS — Z5112 Encounter for antineoplastic immunotherapy: Secondary | ICD-10-CM

## 2011-09-17 LAB — CMP (CANCER CENTER ONLY)
Albumin: 3.8 g/dL (ref 3.3–5.5)
Alkaline Phosphatase: 84 U/L (ref 26–84)
CO2: 29 mEq/L (ref 18–33)
Calcium: 9 mg/dL (ref 8.0–10.3)
Chloride: 99 mEq/L (ref 98–108)
Glucose, Bld: 107 mg/dL (ref 73–118)
Potassium: 4.4 mEq/L (ref 3.3–4.7)
Sodium: 135 mEq/L (ref 128–145)
Total Protein: 7.2 g/dL (ref 6.4–8.1)

## 2011-09-17 LAB — CBC WITH DIFFERENTIAL (CANCER CENTER ONLY)
Eosinophils Absolute: 0.1 10*3/uL (ref 0.0–0.5)
HGB: 15.3 g/dL (ref 11.6–15.9)
MONO#: 0.4 10*3/uL (ref 0.1–0.9)
NEUT#: 1.3 10*3/uL — ABNORMAL LOW (ref 1.5–6.5)
Platelets: 115 10*3/uL — ABNORMAL LOW (ref 145–400)
RBC: 4.98 10*6/uL (ref 3.70–5.32)
WBC: 2.8 10*3/uL — ABNORMAL LOW (ref 3.9–10.0)

## 2011-09-18 ENCOUNTER — Encounter (HOSPITAL_BASED_OUTPATIENT_CLINIC_OR_DEPARTMENT_OTHER): Payer: BC Managed Care – PPO | Admitting: Hematology & Oncology

## 2011-09-18 ENCOUNTER — Ambulatory Visit (HOSPITAL_BASED_OUTPATIENT_CLINIC_OR_DEPARTMENT_OTHER)
Admission: RE | Admit: 2011-09-18 | Discharge: 2011-09-18 | Disposition: A | Discharge status: Home / Self Care | Payer: BC Managed Care – PPO | Source: Ambulatory Visit | Attending: Family | Admitting: Family

## 2011-09-18 ENCOUNTER — Other Ambulatory Visit: Payer: Self-pay | Admitting: Family

## 2011-09-18 DIAGNOSIS — Z5112 Encounter for antineoplastic immunotherapy: Secondary | ICD-10-CM

## 2011-09-18 DIAGNOSIS — Z85820 Personal history of malignant melanoma of skin: Secondary | ICD-10-CM | POA: Insufficient documentation

## 2011-09-18 DIAGNOSIS — R059 Cough, unspecified: Secondary | ICD-10-CM | POA: Insufficient documentation

## 2011-09-18 DIAGNOSIS — R599 Enlarged lymph nodes, unspecified: Secondary | ICD-10-CM | POA: Insufficient documentation

## 2011-09-18 DIAGNOSIS — R05 Cough: Secondary | ICD-10-CM

## 2011-09-18 DIAGNOSIS — C4359 Malignant melanoma of other part of trunk: Secondary | ICD-10-CM

## 2011-09-18 DIAGNOSIS — J069 Acute upper respiratory infection, unspecified: Secondary | ICD-10-CM | POA: Insufficient documentation

## 2011-09-19 ENCOUNTER — Encounter (HOSPITAL_BASED_OUTPATIENT_CLINIC_OR_DEPARTMENT_OTHER): Payer: BC Managed Care – PPO | Admitting: Hematology & Oncology

## 2011-09-19 DIAGNOSIS — C4359 Malignant melanoma of other part of trunk: Secondary | ICD-10-CM

## 2011-09-19 DIAGNOSIS — Z5112 Encounter for antineoplastic immunotherapy: Secondary | ICD-10-CM

## 2011-09-20 ENCOUNTER — Encounter: Payer: Self-pay | Admitting: Internal Medicine

## 2011-09-20 NOTE — Progress Notes (Signed)
  Subjective:    Patient ID: Susan Mccarthy, female    DOB: February 09, 1951, 60 y.o.   MRN: [id number]  HPI  F/u vit B12 def, vit D def and melanoma  Review of Systems  Constitutional: Positive for fatigue. Negative for chills, activity change, appetite change and unexpected weight change.  HENT: Negative for congestion, mouth sores and sinus pressure.   Eyes: Negative for visual disturbance.  Respiratory: Negative for cough and chest tightness.   Cardiovascular: Positive for chest pain (better on R).  Gastrointestinal: Negative for nausea and abdominal pain.  Genitourinary: Negative for frequency, difficulty urinating and vaginal pain.  Musculoskeletal: Negative for back pain and gait problem.  Skin: Negative for pallor and rash.  Neurological: Negative for dizziness, tremors, weakness, numbness and headaches.  Psychiatric/Behavioral: Negative for confusion and sleep disturbance.       Objective:   Physical Exam  Constitutional: She appears well-developed and well-nourished. No distress.  HENT:  Head: Normocephalic.  Right Ear: External ear normal.  Left Ear: External ear normal.  Nose: Nose normal.  Mouth/Throat: Oropharynx is clear and moist.  Eyes: Conjunctivae are normal. Pupils are equal, round, and reactive to light. Right eye exhibits no discharge. Left eye exhibits no discharge.  Neck: Normal range of motion. Neck supple. No JVD present. No tracheal deviation present. No thyromegaly present.  Cardiovascular: Normal rate, regular rhythm and normal heart sounds.   Pulmonary/Chest: No stridor. No respiratory distress. She has no wheezes.  Abdominal: Soft. Bowel sounds are normal. She exhibits no distension and no mass. There is tenderness (chest wall is better). There is no rebound and no guarding.       healing  Musculoskeletal: She exhibits no edema and no tenderness.  Lymphadenopathy:    She has no cervical adenopathy.  Neurological: She displays normal reflexes. No  cranial nerve deficit. She exhibits normal muscle tone. Coordination normal.  Skin: No rash noted. No erythema.  Psychiatric: She has a normal mood and affect. Her behavior is normal. Judgment and thought content normal.    Labs reviewed      Assessment & Plan:

## 2011-09-20 NOTE — Assessment & Plan Note (Signed)
L upper back 2012 S/p surg 06/09/11, 07/28/11 PET 7/12:

## 2011-09-20 NOTE — Assessment & Plan Note (Signed)
Start on treatment 

## 2011-09-20 NOTE — Assessment & Plan Note (Signed)
Start on treatment

## 2011-09-21 ENCOUNTER — Encounter (HOSPITAL_BASED_OUTPATIENT_CLINIC_OR_DEPARTMENT_OTHER): Payer: BC Managed Care – PPO | Admitting: Hematology & Oncology

## 2011-09-21 DIAGNOSIS — Z5112 Encounter for antineoplastic immunotherapy: Secondary | ICD-10-CM

## 2011-09-24 ENCOUNTER — Other Ambulatory Visit: Payer: Self-pay | Admitting: Family

## 2011-09-24 ENCOUNTER — Encounter (HOSPITAL_BASED_OUTPATIENT_CLINIC_OR_DEPARTMENT_OTHER): Payer: BC Managed Care – PPO | Admitting: Hematology & Oncology

## 2011-09-24 DIAGNOSIS — C4359 Malignant melanoma of other part of trunk: Secondary | ICD-10-CM

## 2011-09-24 DIAGNOSIS — Z5112 Encounter for antineoplastic immunotherapy: Secondary | ICD-10-CM

## 2011-09-24 LAB — CMP (CANCER CENTER ONLY)
Albumin: 3.9 g/dL (ref 3.3–5.5)
BUN, Bld: 11 mg/dL (ref 7–22)
Calcium: 8.9 mg/dL (ref 8.0–10.3)
Chloride: 98 mEq/L (ref 98–108)
Creat: 0.6 mg/dl (ref 0.6–1.2)
Glucose, Bld: 110 mg/dL (ref 73–118)
Potassium: 4.4 mEq/L (ref 3.3–4.7)

## 2011-09-24 LAB — CBC WITH DIFFERENTIAL (CANCER CENTER ONLY)
BASO#: 0 10*3/uL (ref 0.0–0.2)
Eosinophils Absolute: 0.1 10*3/uL (ref 0.0–0.5)
HCT: 43.6 % (ref 34.8–46.6)
HGB: 15.5 g/dL (ref 11.6–15.9)
LYMPH#: 0.8 10*3/uL — ABNORMAL LOW (ref 0.9–3.3)
MCH: 30.6 pg (ref 26.0–34.0)
MCHC: 35.6 g/dL (ref 32.0–36.0)
NEUT#: 2.3 10*3/uL (ref 1.5–6.5)
NEUT%: 63.8 % (ref 39.6–80.0)
RBC: 5.06 10*6/uL (ref 3.70–5.32)

## 2011-09-25 ENCOUNTER — Ambulatory Visit (HOSPITAL_BASED_OUTPATIENT_CLINIC_OR_DEPARTMENT_OTHER): Payer: BC Managed Care – PPO | Admitting: Hematology & Oncology

## 2011-09-25 DIAGNOSIS — C4359 Malignant melanoma of other part of trunk: Secondary | ICD-10-CM

## 2011-09-25 DIAGNOSIS — Z5112 Encounter for antineoplastic immunotherapy: Secondary | ICD-10-CM

## 2011-09-27 ENCOUNTER — Encounter (HOSPITAL_BASED_OUTPATIENT_CLINIC_OR_DEPARTMENT_OTHER): Payer: BC Managed Care – PPO | Admitting: Hematology & Oncology

## 2011-09-27 DIAGNOSIS — C4359 Malignant melanoma of other part of trunk: Secondary | ICD-10-CM

## 2011-09-27 DIAGNOSIS — Z5112 Encounter for antineoplastic immunotherapy: Secondary | ICD-10-CM

## 2011-09-28 ENCOUNTER — Encounter (HOSPITAL_BASED_OUTPATIENT_CLINIC_OR_DEPARTMENT_OTHER): Payer: BC Managed Care – PPO | Admitting: Hematology & Oncology

## 2011-09-28 DIAGNOSIS — C4359 Malignant melanoma of other part of trunk: Secondary | ICD-10-CM

## 2011-10-01 ENCOUNTER — Other Ambulatory Visit: Payer: Self-pay | Admitting: Family

## 2011-10-01 DIAGNOSIS — Z5112 Encounter for antineoplastic immunotherapy: Secondary | ICD-10-CM

## 2011-10-01 LAB — CBC WITH DIFFERENTIAL (CANCER CENTER ONLY)
BASO#: 0 10*3/uL (ref 0.0–0.2)
EOS%: 0.5 % (ref 0.0–7.0)
Eosinophils Absolute: 0 10*3/uL (ref 0.0–0.5)
HCT: 41.9 % (ref 34.8–46.6)
HGB: 15.2 g/dL (ref 11.6–15.9)
LYMPH#: 0.7 10*3/uL — ABNORMAL LOW (ref 0.9–3.3)
MCHC: 36.3 g/dL — ABNORMAL HIGH (ref 32.0–36.0)
MONO#: 0.3 10*3/uL (ref 0.1–0.9)
NEUT#: 1.2 10*3/uL — ABNORMAL LOW (ref 1.5–6.5)
RBC: 4.95 10*6/uL (ref 3.70–5.32)
WBC: 2.2 10*3/uL — ABNORMAL LOW (ref 3.9–10.0)

## 2011-10-01 LAB — CMP (CANCER CENTER ONLY)
AST: 50 U/L — ABNORMAL HIGH (ref 11–38)
Albumin: 3.4 g/dL (ref 3.3–5.5)
BUN, Bld: 6 mg/dL — ABNORMAL LOW (ref 7–22)
CO2: 26 mEq/L (ref 18–33)
Calcium: 8.3 mg/dL (ref 8.0–10.3)
Chloride: 106 mEq/L (ref 98–108)
Potassium: 4.6 mEq/L (ref 3.3–4.7)

## 2011-10-02 ENCOUNTER — Encounter (HOSPITAL_BASED_OUTPATIENT_CLINIC_OR_DEPARTMENT_OTHER): Payer: BC Managed Care – PPO | Admitting: Hematology & Oncology

## 2011-10-02 DIAGNOSIS — C4359 Malignant melanoma of other part of trunk: Secondary | ICD-10-CM

## 2011-10-02 DIAGNOSIS — Z5112 Encounter for antineoplastic immunotherapy: Secondary | ICD-10-CM

## 2011-10-03 ENCOUNTER — Encounter (HOSPITAL_BASED_OUTPATIENT_CLINIC_OR_DEPARTMENT_OTHER): Payer: BC Managed Care – PPO | Admitting: Hematology & Oncology

## 2011-10-03 DIAGNOSIS — C4359 Malignant melanoma of other part of trunk: Secondary | ICD-10-CM

## 2011-10-03 DIAGNOSIS — Z5112 Encounter for antineoplastic immunotherapy: Secondary | ICD-10-CM

## 2011-10-04 ENCOUNTER — Encounter: Payer: BC Managed Care – PPO | Admitting: Hematology & Oncology

## 2011-10-04 ENCOUNTER — Encounter (HOSPITAL_BASED_OUTPATIENT_CLINIC_OR_DEPARTMENT_OTHER): Payer: BC Managed Care – PPO | Admitting: Hematology & Oncology

## 2011-10-04 DIAGNOSIS — C4359 Malignant melanoma of other part of trunk: Secondary | ICD-10-CM

## 2011-10-04 DIAGNOSIS — Z5112 Encounter for antineoplastic immunotherapy: Secondary | ICD-10-CM

## 2011-10-05 ENCOUNTER — Encounter: Payer: Self-pay | Admitting: *Deleted

## 2011-10-05 ENCOUNTER — Encounter: Payer: BC Managed Care – PPO | Admitting: Hematology & Oncology

## 2011-10-08 ENCOUNTER — Ambulatory Visit: Payer: BC Managed Care – PPO

## 2011-10-08 ENCOUNTER — Institutional Professional Consult (permissible substitution): Payer: BC Managed Care – PPO | Admitting: Radiation Oncology

## 2011-10-08 ENCOUNTER — Telehealth: Payer: Self-pay | Admitting: Hematology & Oncology

## 2011-10-08 NOTE — Telephone Encounter (Signed)
none

## 2011-10-08 NOTE — Telephone Encounter (Signed)
Husband called moved 11-5 to 11-7 due to pt hurting ribs RN aware

## 2011-10-10 ENCOUNTER — Ambulatory Visit: Payer: BC Managed Care – PPO

## 2011-10-10 ENCOUNTER — Telehealth: Payer: Self-pay | Admitting: Hematology & Oncology

## 2011-10-10 ENCOUNTER — Telehealth: Payer: Self-pay | Admitting: *Deleted

## 2011-10-10 MED ORDER — OXYCODONE-ACETAMINOPHEN 5-325 MG PO TABS: 1.0000 | ORAL_TABLET | Freq: Three times a day (TID) | ORAL | Status: DC | PRN

## 2011-10-10 NOTE — Telephone Encounter (Signed)
Pt rescheduled 11-7 infusion moved to 11-12. Sent message to RN

## 2011-10-10 NOTE — Telephone Encounter (Signed)
Patient's husband & patient informed [in background] & agreed to make appointment for 1:00pm 10/11/11. Will p/u script at front desk.

## 2011-10-10 NOTE — Telephone Encounter (Signed)
1) Caller states that patient had fall on Sunday and "bruised ribs"-when questioned about diagnosis-caller stated that he cannot get patient out of house to be seen because she is in "so much pain" and has Not been seen by a doctor since her fall. 2) Caller states patient is seen by Dr Myna Hidalgo at [facility] but that they "cannot get anyone to talk to them at that office"? 3) Caller is requesting a Rx refill for patient's Oxycodone-APAP to [facility] [at [place]], again stating that he cannot get Pt out of house to be seen by physician.  Please Advise.

## 2011-10-10 NOTE — Telephone Encounter (Signed)
OK #[phone] I can see her tomorrow 1 pm Thx

## 2011-10-11 ENCOUNTER — Telehealth: Payer: Self-pay | Admitting: Internal Medicine

## 2011-10-11 ENCOUNTER — Ambulatory Visit (INDEPENDENT_AMBULATORY_CARE_PROVIDER_SITE_OTHER): Payer: BC Managed Care – PPO | Admitting: Internal Medicine

## 2011-10-11 ENCOUNTER — Encounter: Payer: Self-pay | Admitting: Internal Medicine

## 2011-10-11 ENCOUNTER — Ambulatory Visit: Payer: BC Managed Care – PPO | Admitting: Internal Medicine

## 2011-10-11 ENCOUNTER — Ambulatory Visit (INDEPENDENT_AMBULATORY_CARE_PROVIDER_SITE_OTHER)
Admission: RE | Admit: 2011-10-11 | Discharge: 2011-10-11 | Disposition: A | Discharge status: Home / Self Care | Payer: BC Managed Care – PPO | Source: Ambulatory Visit | Attending: Internal Medicine | Admitting: Internal Medicine

## 2011-10-11 VITALS — BP 112/70 | HR 80 | Temp 98.0°F | Resp 16 | Wt 132.0 lb

## 2011-10-11 DIAGNOSIS — S20219A Contusion of unspecified front wall of thorax, initial encounter: Secondary | ICD-10-CM

## 2011-10-11 DIAGNOSIS — R0789 Other chest pain: Secondary | ICD-10-CM

## 2011-10-11 DIAGNOSIS — S2249XA Multiple fractures of ribs, unspecified side, initial encounter for closed fracture: Secondary | ICD-10-CM

## 2011-10-11 DIAGNOSIS — R071 Chest pain on breathing: Secondary | ICD-10-CM

## 2011-10-11 DIAGNOSIS — J209 Acute bronchitis, unspecified: Secondary | ICD-10-CM

## 2011-10-11 DIAGNOSIS — R110 Nausea: Secondary | ICD-10-CM

## 2011-10-11 MED ORDER — PROMETHAZINE HCL 50 MG/ML IJ SOLN
50.0000 mg | Freq: Once | INTRAMUSCULAR | Status: AC
  Administered 2011-10-11: 50 mg via INTRAMUSCULAR

## 2011-10-11 MED ORDER — FENTANYL 50 MCG/HR TD PT72: 1.0000 | MEDICATED_PATCH | TRANSDERMAL | Status: DC

## 2011-10-11 MED ORDER — MEPERIDINE HCL 50 MG/ML IJ SOLN
50.0000 mg | Freq: Once | INTRAMUSCULAR | Status: AC
  Administered 2011-10-11: 50 mg via INTRAMUSCULAR

## 2011-10-11 MED ORDER — DICLOFENAC SODIUM 1 % TD GEL: 1.0000 "application " | Freq: Four times a day (QID) | TRANSDERMAL | Status: DC

## 2011-10-11 MED ORDER — AZITHROMYCIN 250 MG PO TABS: ORAL_TABLET | ORAL | Status: DC

## 2011-10-11 NOTE — Telephone Encounter (Signed)
Susan Mccarthy, please, inform th pt - 4 broken ribs. Cont Rx. Keep ROV Thx

## 2011-10-11 NOTE — Progress Notes (Signed)
  Subjective:    Patient ID: Susan Mccarthy, female    DOB: 1951/07/02, 60 y.o.   MRN: [id number]  HPI  C/o R CP x 2 d after she slid off the commode and hit a counter at night. The pain is 10/10, meds do not help C/o cogh - yellow sputum  Review of Systems  Constitutional: Negative for fever, chills and unexpected weight change.  HENT: Positive for congestion. Negative for neck pain, voice change and sinus pressure.   Eyes: Negative for discharge.  Respiratory: Positive for cough and shortness of breath. Negative for chest tightness and wheezing.   Cardiovascular: Positive for chest pain. Negative for palpitations and leg swelling.  Gastrointestinal: Negative for abdominal pain.  Skin: Negative for rash and wound.  Neurological: Negative for weakness and light-headedness.  Psychiatric/Behavioral: Positive for sleep disturbance. The patient is nervous/anxious.        Objective:   Physical Exam  Constitutional: She appears well-developed and well-nourished. She appears distressed.  HENT:  Head: Normocephalic.  Right Ear: External ear normal.  Left Ear: External ear normal.  Nose: Nose normal.  Mouth/Throat: Oropharynx is clear and moist.  Eyes: Conjunctivae are normal. Pupils are equal, round, and reactive to light. Right eye exhibits no discharge. Left eye exhibits no discharge.  Neck: Normal range of motion. Neck supple. No JVD present. No tracheal deviation present. No thyromegaly present.  Cardiovascular: Normal rate, regular rhythm and normal heart sounds.  Exam reveals no gallop.   Pulmonary/Chest: No stridor. No respiratory distress. She has no wheezes. She has no rales. She exhibits tenderness (Severe in the R breast area).  Abdominal: Soft. Bowel sounds are normal. She exhibits no distension and no mass. There is no tenderness. There is no rebound and no guarding.  Musculoskeletal: She exhibits tenderness. She exhibits no edema.  Lymphadenopathy:    She has no cervical  adenopathy.  Neurological: She displays normal reflexes. No cranial nerve deficit. She exhibits normal muscle tone. Coordination normal.  Skin: No rash noted. She is not diaphoretic. No erythema.  Psychiatric: She has a normal mood and affect. Her behavior is normal. Judgment and thought content normal.         Assessment & Plan:   A complex case of severe pain and underlying h/o melanoma, s/p surgery

## 2011-10-11 NOTE — Assessment & Plan Note (Addendum)
Severe pain R Treatment as above Rib belt

## 2011-10-11 NOTE — Assessment & Plan Note (Signed)
Severe pain not relieved with oxycodone qid. Start Duragesic at 50 mcg (seems tolerant to higher doses)  11/12 - contusion  Potential benefits of a short/long term opioids use as well as potential risks (i.e. addiction risk, apnea etc) and complications (i.e. Somnolence, constipation and others) were explained to the patient and were aknowledged. Prom/Demerol IM given

## 2011-10-12 NOTE — Telephone Encounter (Signed)
Called patient//lmovm for her to call back for MD

## 2011-10-13 ENCOUNTER — Encounter: Payer: Self-pay | Admitting: Internal Medicine

## 2011-10-13 NOTE — Assessment & Plan Note (Signed)
Start an abx

## 2011-10-13 NOTE — Assessment & Plan Note (Signed)
11/12 R x 4 ribs 11/8 xray: IMPRESSION:  Acute fractures of the anterolateral aspects of the right third,  fourth, fifth, and sixth ribs. No pneumothorax or pleural  effusion.   Dem/Phen Duragesic Oxycod Rib belt

## 2011-10-15 ENCOUNTER — Inpatient Hospital Stay
Admission: RE | Admit: 2011-10-15 | Payer: BC Managed Care – PPO | Source: Ambulatory Visit | Admitting: Radiation Oncology

## 2011-10-15 ENCOUNTER — Ambulatory Visit: Payer: BC Managed Care – PPO

## 2011-10-16 ENCOUNTER — Telehealth: Payer: Self-pay | Admitting: Hematology & Oncology

## 2011-10-16 NOTE — Telephone Encounter (Signed)
Per RN sent pt calender.

## 2011-10-17 ENCOUNTER — Ambulatory Visit: Payer: BC Managed Care – PPO | Admitting: Internal Medicine

## 2011-10-18 NOTE — Telephone Encounter (Signed)
Pt informed and OV scheduled for 10-23-11.

## 2011-10-23 ENCOUNTER — Ambulatory Visit: Payer: BC Managed Care – PPO | Admitting: Internal Medicine

## 2011-10-24 ENCOUNTER — Ambulatory Visit
Admission: RE | Admit: 2011-10-24 | Discharge: 2011-10-24 | Disposition: A | Discharge status: Home / Self Care | Payer: BC Managed Care – PPO | Source: Ambulatory Visit | Attending: Radiation Oncology | Admitting: Radiation Oncology

## 2011-10-24 ENCOUNTER — Ambulatory Visit: Payer: BC Managed Care – PPO

## 2011-10-24 ENCOUNTER — Telehealth: Payer: Self-pay | Admitting: *Deleted

## 2011-10-24 ENCOUNTER — Encounter: Payer: Self-pay | Admitting: Radiation Oncology

## 2011-10-24 DIAGNOSIS — C4359 Malignant melanoma of other part of trunk: Secondary | ICD-10-CM

## 2011-10-24 DIAGNOSIS — Z87891 Personal history of nicotine dependence: Secondary | ICD-10-CM | POA: Insufficient documentation

## 2011-10-24 DIAGNOSIS — Z79899 Other long term (current) drug therapy: Secondary | ICD-10-CM | POA: Insufficient documentation

## 2011-10-24 DIAGNOSIS — C7800 Secondary malignant neoplasm of unspecified lung: Secondary | ICD-10-CM | POA: Insufficient documentation

## 2011-10-24 DIAGNOSIS — Z85820 Personal history of malignant melanoma of skin: Secondary | ICD-10-CM | POA: Insufficient documentation

## 2011-10-24 DIAGNOSIS — C773 Secondary and unspecified malignant neoplasm of axilla and upper limb lymph nodes: Secondary | ICD-10-CM | POA: Insufficient documentation

## 2011-10-24 DIAGNOSIS — R599 Enlarged lymph nodes, unspecified: Secondary | ICD-10-CM | POA: Insufficient documentation

## 2011-10-24 HISTORY — DX: Depression, unspecified: F32.A

## 2011-10-24 HISTORY — DX: Personal history of antineoplastic chemotherapy: Z92.21

## 2011-10-24 HISTORY — DX: Insomnia, unspecified: G47.00

## 2011-10-24 HISTORY — DX: Major depressive disorder, single episode, unspecified: F32.9

## 2011-10-24 HISTORY — DX: Disorder of thyroid, unspecified: E07.9

## 2011-10-24 HISTORY — DX: Anxiety disorder, unspecified: F41.9

## 2011-10-24 NOTE — Progress Notes (Signed)
CC:   [name], M.D. Georgina Quint. Plotnikov, MD [name], MD  REFERRING PHYSICIAN:  [name], M.D.   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  HISTORY OF PRESENT ILLNESS:  Susan Mccarthy is a very pleasant, 60 year old female who is seen out of courtesy for Dr. Myna Hidalgo for an opinion concerning radiation therapy as part of the management of the patient's advanced melanoma.  Earlier this year, Susan Mccarthy was found have a melanoma involving the right mid back area.  The patient, in addition, did have a prior history of remote melanoma removed from the right upper back.  The patient was seen by Dr. [name] and Dr. [name].  The patient proceeded to undergo surgery on 06/08/2011 with a 6 cm malignant melanoma removed with a 1.1 cm macrosatellite nodule.  There was angiolymphatic invasion present.  The resection margins were clear. Clark's level was IV.  The patient did undergo a PET scan, which showed metastatic spread to the right axilla.  The patient was subsequently seen at [facility] and underwent right axillary dissection.  The patient also underwent wide local excision of the site of presentation in the right mid back area.  Within the wide local excision, the patient was found have a positive malignant melanoma, which was approximately 2 mm in size.  The surgical margins were clear.  Within the right axilla, the patient was found to have 3/27 lymph nodes showing metastatic melanoma with the largest node measuring 4 cm.  After the patient's surgery, she was subsequently seen by Dr. [name] and proceeded to undergo high-dose interferon.  The patient recently completed her 4th cycle of high-dose interferon.  The patient is now seen in Radiation Oncology for the consideration of additional therapy.  PAST MEDICAL HISTORY:  The patient has no known drug allergies.  The patient is on multiple medications, which were reviewed and  placed in the electronic medical record.  Her medical history is significant for a history of anxiety, depression, and thyroid disease.  The patient also has a history of insomnia.  The patient did have a prior melanoma along the right upper back with surgery in 1980.  SOCIAL HISTORY:  The patient lives in the [place] area.  She is accompanied by a good friend and long-term partner.  The patient has rare occasional alcohol intake.  The patient is a former smoker and stopped smoking in 1980.  FAMILY HISTORY:  No family history of malignancy.  REVIEW OF SYSTEMS:  The patient admits to anxiety, particularly with examinations and imaging.  The patient requests that the examination door be left open during evaluation.  The patient has discomfort in the right axilla as well as her surgical site within the back.  The patient denies any cough or breathing problems.  She denies any new bony pain, headaches, dizziness, or blurred vision.  A complete review of systems was undertaken with the patient and other than the above issues, it is unremarkable.  PHYSICAL EXAMINATION:  Vital Signs:  Temperature is 97.4.  Pulse 89. Blood pressure 145/86.  Weight is 138 pounds.  HEENT:  Examination of the pupils reveals them to be equal, round, and reactive to light.  The extraocular eye movements are intact.  The tongue is midline.  There is no secondary infection noted in the oral cavity or posterior pharynx. Neck:  Examination of the neck and supraclavicular region reveals some questionable shotty adenopathy in the left supraclavicular fossa. Axillae:  Examination the right axilla reveals surgical changes without palpable adenopathy.  Examination of the left axilla reveals an approximately 1-1/2 x 1 cm, firm, mobile lymph node.  Back:  Examination of back reveals an approximately 10 cm scar in the mid right back region from her site of presentation.  There are no obvious palpable or visible signs of  recurrence in this area.  In the right upper back, the patient has a smaller scar, approximately 3-4 cm in length, from her remote history of melanoma removal in this area.  Abdomen:  Examination the abdomen reveals it to be soft and nontender with normal bowel sounds. Neurological Examination:  Motor strength is 5/5 in the proximal and distal muscle groups of the upper and lower extremities.  Peripheral pulses are good.  I do not appreciate any significant lymphedema in the right arm or hand.  LABORATORY DATA:  White count from 10/01/2011 is 2.2, hemoglobin 15.2, platelet count 190,000. X-ray studies are as summarized in the HPI.  The patient did have a chest x-ray on 10/11/2011 in light of a fall.  This showed acute fractures of the anterolateral aspects of the right 3rd, 4th, 5th, and 6th ribs.  IMPRESSION AND PLAN:  Advanced metastatic melanoma.  As above, the patient has significant axillary nodal involvement, which was the only site of metastatic spread on her PET scan earlier this year.  I do feel the patient would be a candidate for radiation therapy directed at the right axillary region to reduce the chances of local regional recurrence.  I, however, as above, noticed a lymph node in the left axillary area, which would be concerning for melanoma involvement.  In light of this, I did speak with Dr. [name], who agrees with proceeding with chest CT scan prior to considering postoperative treatments directed at the right axilla.  The patient will be set up for her chest CT scan as soon as possible.  Final details concerning her management are pending her upcoming chest CT scan.    ______________________________ [name], Ph.D., M.D. JDK/MEDQ  D:  10/24/2011  T:  10/24/2011  Job:  [id number]

## 2011-10-24 NOTE — Telephone Encounter (Signed)
NO PRE CERT REQ'D Pt's [employer] Plan does not req pre cert on an outpt base services. I spoke with Farrel Gordon and Jonn Shingles.  Cpt [phone] Ct chest w w/o contrast, which will done at [facility] on 10/30/2011 at 9:30a  Diagnosis: 172.5 Trunk, except scrotum.

## 2011-10-24 NOTE — Progress Notes (Signed)
Melanoma right back dx 1980  06/08/11 r back fungating mass excision Dr. [name]  hx 07/26/11 path right axillary dissection(3/27) lymph nodes= metastatic,at [facility]stage III C MARRIED, NO CHILDREN FELL AT HOME  1 MONTH AGO 4 FRACTURED RIBS RIGHT SIDE  Allergies:seasonal 10:12 AM    .

## 2011-10-29 ENCOUNTER — Other Ambulatory Visit: Payer: Self-pay | Admitting: Hematology & Oncology

## 2011-10-29 ENCOUNTER — Other Ambulatory Visit (HOSPITAL_BASED_OUTPATIENT_CLINIC_OR_DEPARTMENT_OTHER): Payer: BC Managed Care – PPO | Admitting: Lab

## 2011-10-29 ENCOUNTER — Ambulatory Visit (HOSPITAL_BASED_OUTPATIENT_CLINIC_OR_DEPARTMENT_OTHER): Payer: BC Managed Care – PPO | Admitting: Hematology & Oncology

## 2011-10-29 VITALS — BP 109/83 | HR 73 | Temp 97.0°F | Ht 65.0 in | Wt 137.0 lb

## 2011-10-29 DIAGNOSIS — C4359 Malignant melanoma of other part of trunk: Secondary | ICD-10-CM

## 2011-10-29 DIAGNOSIS — C439 Malignant melanoma of skin, unspecified: Secondary | ICD-10-CM

## 2011-10-29 DIAGNOSIS — F418 Other specified anxiety disorders: Secondary | ICD-10-CM

## 2011-10-29 LAB — CBC WITH DIFFERENTIAL (CANCER CENTER ONLY)
BASO%: 0.3 % (ref 0.0–2.0)
LYMPH#: 0.9 10*3/uL (ref 0.9–3.3)
MONO#: 0.6 10*3/uL (ref 0.1–0.9)
Platelets: 190 10*3/uL (ref 145–400)
RDW: 14.2 % (ref 11.1–15.7)
WBC: 6.1 10*3/uL (ref 3.9–10.0)

## 2011-10-29 LAB — CMP (CANCER CENTER ONLY)
ALT(SGPT): 19 U/L (ref 10–47)
AST: 23 U/L (ref 11–38)
CO2: 29 mEq/L (ref 18–33)
Calcium: 9.4 mg/dL (ref 8.0–10.3)
Chloride: 94 mEq/L — ABNORMAL LOW (ref 98–108)
Sodium: 137 mEq/L (ref 128–145)
Total Bilirubin: 0.5 mg/dl (ref 0.20–1.60)
Total Protein: 7.6 g/dL (ref 6.4–8.1)

## 2011-10-29 LAB — LACTATE DEHYDROGENASE: LDH: 189 U/L (ref 94–250)

## 2011-10-29 MED ORDER — OXYCODONE-ACETAMINOPHEN 5-325 MG PO TABS: 1.0000 | ORAL_TABLET | Freq: Four times a day (QID) | ORAL | Status: DC | PRN

## 2011-10-29 MED ORDER — LORAZEPAM 2 MG/ML IJ SOLN: 0.5000 mg | Freq: Once | INTRAMUSCULAR | Status: DC

## 2011-10-29 NOTE — Progress Notes (Signed)
This office note has been dictated.

## 2011-10-30 ENCOUNTER — Other Ambulatory Visit: Payer: Self-pay | Admitting: Radiation Oncology

## 2011-10-30 ENCOUNTER — Ambulatory Visit (HOSPITAL_COMMUNITY)
Admission: RE | Admit: 2011-10-30 | Discharge: 2011-10-30 | Disposition: A | Discharge status: Home / Self Care | Payer: BC Managed Care – PPO | Source: Ambulatory Visit | Attending: Radiation Oncology | Admitting: Radiation Oncology

## 2011-10-30 ENCOUNTER — Telehealth: Payer: Self-pay | Admitting: Radiation Oncology

## 2011-10-30 DIAGNOSIS — E041 Nontoxic single thyroid nodule: Secondary | ICD-10-CM | POA: Insufficient documentation

## 2011-10-30 DIAGNOSIS — R599 Enlarged lymph nodes, unspecified: Secondary | ICD-10-CM | POA: Insufficient documentation

## 2011-10-30 DIAGNOSIS — C7800 Secondary malignant neoplasm of unspecified lung: Secondary | ICD-10-CM | POA: Insufficient documentation

## 2011-10-30 DIAGNOSIS — C773 Secondary and unspecified malignant neoplasm of axilla and upper limb lymph nodes: Secondary | ICD-10-CM | POA: Insufficient documentation

## 2011-10-30 DIAGNOSIS — C4359 Malignant melanoma of other part of trunk: Secondary | ICD-10-CM

## 2011-10-30 DIAGNOSIS — J438 Other emphysema: Secondary | ICD-10-CM | POA: Insufficient documentation

## 2011-10-30 DIAGNOSIS — K449 Diaphragmatic hernia without obstruction or gangrene: Secondary | ICD-10-CM | POA: Insufficient documentation

## 2011-10-30 DIAGNOSIS — C439 Malignant melanoma of skin, unspecified: Secondary | ICD-10-CM | POA: Insufficient documentation

## 2011-10-30 MED ORDER — LORAZEPAM 2 MG/ML IJ SOLN
0.5000 mg | Freq: Once | INTRAMUSCULAR | Status: AC
Start: 2011-10-30 — End: ?

## 2011-10-30 MED ORDER — IOHEXOL 300 MG/ML  SOLN
80.0000 mL | Freq: Once | INTRAMUSCULAR | Status: AC | PRN
  Administered 2011-10-30: 80 mL via INTRAVENOUS

## 2011-10-30 NOTE — Progress Notes (Signed)
CC:   [name], MD Georgina Quint Plotnikov, MD [name], MD  DIAGNOSIS:  Stage III (T4b N3 M0) melanoma of the right back.  CURRENT THERAPY: 1. The patient status post 4 weeks of high-dose interferon. 2. The patient to start radiotherapy to the right axilla.  INTERIM HISTORY:  Ms. Penza comes in for followup.  She is an emotional "wreck today."  She just is overwhelmed about everything that is going on with her.  She saw Dr. Roselind Messier a week ago.  She apparently did not get a "anything out of the visit."  She also has no clue what is going on with radiation.  Dr. Roselind Messier called me.  He said he felt a lump in the left axilla.  He set her up with scans.  This, I agree with.  Mr. Vanderlinden is very claustrophobic.  She does not think she can do the scans.  We will try to get her through the scans with some sedation.  She is tired.  She is weak.  She says she gets dizzy.  She is not eating much.  She is quite depressed.  She has had no bleeding.  There has  been no diarrhea.  She has had no leg or swelling.  A really spent 45 minutes or so with her today trying to get her through this appointment.  We are going to give her some IV fluids today.  PHYSICAL EXAM:  General: This is a well-developed white female in no obvious physical distress.  Vital signs show temperature of 97, pulse 73, respiratory rate 20, blood pressure 109/83, and weight is 137.  Head and neck exam shows a normocephalic, atraumatic skull.  There are no ocular or oral lesions.  She does have some swelling about the right orbit.  She did fall a few days ago.  She landed on her right side. Neck: She has no adenopathy in her neck.  Lungs are clear bilaterally. Cardiac examination:  Regular rhythm with a normal S1 and S2.  There are no murmurs, rubs, or bruits.  Axillary exam shows a mobile, 1 to 2-cm firm mass in the left axilla.  Her right axilla shows a lymphadenectomy scar.  No tenderness is noted in  this area.  Breast exam does show ecchymosis about the right breast.  Left breast was unremarkable.  No masses noted in either breast bilaterally.  Abdominal exam: Soft with good bowel sounds.  There is no fluid wave.  No palpable abdominal masses.  There is no palpable hepatosplenomegaly.  Back exam shows a well-healed wide local excision scar in the right mid back.  No erythema, swelling, or warmth is noted about the incision site. Extremities shows no lymphedema in the upper extremities.  She has good range of motion of her joints.  She has good pulses in her distal extremities.  Neurological exam shows no focal neurological deficit.  OTHER LAB RESULTS:  White cell count is 6.1, hemoglobin 13.5, hematocrit 37.8, and platelet count 190.  Sodium 137, potassium 4.2, BUN 12, and creatinine 0.7.  Calcium 9.4 with an albumin of 3.6.  IMPRESSION:  Ms. Weld is a 60 year old white female with stage III melanoma of the right back.  She underwent surgery at [facility].  I am quite concerned about the fact that she may now have metastatic disease.  I think if we document that this note in the left axilla is melanoma, that I believe this would make her metastatic.  I am trying to convince  Ms. Dolinar to do additional studies.  It is going to be very tough.  She is just very emotional over her situation. It is hard to say if she is getting a lot of help at home.  We will have to see what the CT scan shows.  If there is clearly a mass in the left axilla, then I will probably get Radiology to biopsy this for me.  She clearly is going to refuse any type of open biopsy/surgery.  She really wants to try and avoid going back to [facility].  I will have to speak with Dr. Lattie Corns out at [facility] to let him know what is going on.  I am going to have to see what her BRAF status is.  I think this is going to be key in dictating how we treat her if we document metastatic melanoma.  Ms. Diniz is one who just is going  to be very reluctant to further testing.  This is going to be an issue moving forward.  We are going to have to really stay on top of the situation with the Ms. Fatzinger.  I just have a bad feeling that we are going to find that she has metastatic disease.  Again, this was a very complicated situation.  She is incredibly emotional.  We spent almost an hour with her today.    ______________________________ [name], M.D. PRE/MEDQ  D:  10/29/2011  T:  10/29/2011  Job:  [id number]  ADDENDUM:  BRAF mutation is (+).  CT scan is (+) for lung mets. I will call  Her and need to see if see wants any therapy.

## 2011-10-30 NOTE — Progress Notes (Signed)
Addended by: [name] R on: 10/30/2011 10:08 AM   Modules accepted: Orders

## 2011-10-30 NOTE — Telephone Encounter (Signed)
I SPOKE WITH Susan Mccarthy @ [employer] AND HE ADVISED NO PRE CERT REQ'Mccarthy UNDER MEMBERS [employer] PLAN FOR CT CHEST W/CONTRAST [phone]), WHICH HAS BEEN DONE AT [facility] TODAY.   DX: 172.9 MELANOMA OF SKIN, SITE UNSPECIFIED

## 2011-11-01 ENCOUNTER — Encounter: Payer: Self-pay | Admitting: *Deleted

## 2011-11-01 ENCOUNTER — Telehealth: Payer: Self-pay | Admitting: *Deleted

## 2011-11-01 ENCOUNTER — Telehealth: Payer: Self-pay | Admitting: Hematology & Oncology

## 2011-11-01 NOTE — Telephone Encounter (Signed)
Pt called wanting to know if she could have a flu shot. After discussing with Dr Myna Hidalgo it was determined that she should not receive one. She then wanted to know what to do if she "got the flu". Explained that she would need to let him know & a rx could be called in for her. She verbalized understanding.

## 2011-11-01 NOTE — Telephone Encounter (Signed)
I left a message.

## 2011-11-01 NOTE — Progress Notes (Signed)
Distress Screening  CSW was referred by distress screening protocol. Pt scored an 8 on DT. CSW unable to meet with pt in exam room, therefore, CSW called pt at home.  After attempts to reach the pt CSW left a message offering support and information on support services at [facility].  CSW encouraged pt to contact any of the support team members with questions or concerns.    Tamala Julian, LCSW

## 2011-11-02 ENCOUNTER — Telehealth: Payer: Self-pay | Admitting: *Deleted

## 2011-11-02 ENCOUNTER — Telehealth: Payer: Self-pay | Admitting: Hematology & Oncology

## 2011-11-02 ENCOUNTER — Other Ambulatory Visit: Payer: Self-pay | Admitting: Hematology & Oncology

## 2011-11-02 DIAGNOSIS — C439 Malignant melanoma of skin, unspecified: Secondary | ICD-10-CM

## 2011-11-02 MED ORDER — VEMURAFENIB 240 MG PO TABS: 960.0000 mg | ORAL_TABLET | Freq: Two times a day (BID) | ORAL | Status: DC

## 2011-11-02 NOTE — Telephone Encounter (Signed)
I spoke w/ Susan Mccarthy and told her about the results of the CT scan.  Her melanoma has come back and we need to re-stage her w/ PET scan and MRI of brain.  Her melanoma is BRAF (+).  Will need to see about Zelboraf.    She has an appt w/ Dr. Posey Rea on Monday which she really can cancel.  Once we get her results back, we will get her back in the office.  This is not a good situation.    [name]

## 2011-11-02 NOTE — Telephone Encounter (Signed)
See telephone note.

## 2011-11-02 NOTE — Telephone Encounter (Signed)
Pt called wanted results from her CT, said MD called last night at 630 pm and she can't handle that and sleep. She wanted her results and I told her I would give her to RN voice mail and to leave a message, she said she did yesterday and no one called her back. I told her we were very busy and someone will call her back, I just can't promise when. I transferred her to RN line, she called right back and said she has already left a message. She was very rude and said someone has to call her before 4 pm with results. I told her again I couldn't promise when they would call her back, she said it doesn't have to be the MD the nurse's can call her. I told her I would give the message to the nurse's but that everyone was seeing patients and taking care of priority calls first but that they would call her, just don't know when. She said Thank You and hung up. RN's aware

## 2011-11-05 ENCOUNTER — Encounter: Payer: Self-pay | Admitting: Internal Medicine

## 2011-11-05 ENCOUNTER — Ambulatory Visit (INDEPENDENT_AMBULATORY_CARE_PROVIDER_SITE_OTHER): Payer: BC Managed Care – PPO | Admitting: Internal Medicine

## 2011-11-05 ENCOUNTER — Telehealth: Payer: Self-pay | Admitting: *Deleted

## 2011-11-05 DIAGNOSIS — E559 Vitamin D deficiency, unspecified: Secondary | ICD-10-CM

## 2011-11-05 DIAGNOSIS — R21 Rash and other nonspecific skin eruption: Secondary | ICD-10-CM

## 2011-11-05 DIAGNOSIS — M79622 Pain in left upper arm: Secondary | ICD-10-CM

## 2011-11-05 DIAGNOSIS — C7800 Secondary malignant neoplasm of unspecified lung: Secondary | ICD-10-CM

## 2011-11-05 DIAGNOSIS — S2249XA Multiple fractures of ribs, unspecified side, initial encounter for closed fracture: Secondary | ICD-10-CM

## 2011-11-05 DIAGNOSIS — R0789 Other chest pain: Secondary | ICD-10-CM

## 2011-11-05 DIAGNOSIS — E538 Deficiency of other specified B group vitamins: Secondary | ICD-10-CM

## 2011-11-05 DIAGNOSIS — C439 Malignant melanoma of skin, unspecified: Secondary | ICD-10-CM

## 2011-11-05 DIAGNOSIS — M79609 Pain in unspecified limb: Secondary | ICD-10-CM

## 2011-11-05 DIAGNOSIS — C801 Malignant (primary) neoplasm, unspecified: Secondary | ICD-10-CM

## 2011-11-05 DIAGNOSIS — R071 Chest pain on breathing: Secondary | ICD-10-CM

## 2011-11-05 MED ORDER — CYANOCOBALAMIN 1000 MCG/ML IJ SOLN: 1000.0000 ug | INTRAMUSCULAR | Status: DC

## 2011-11-05 MED ORDER — TRIAMCINOLONE ACETONIDE 0.5 % EX CREA: TOPICAL_CREAM | Freq: Three times a day (TID) | CUTANEOUS | Status: DC

## 2011-11-05 NOTE — Assessment & Plan Note (Signed)
Restart Duragesic F/u w/Onc

## 2011-11-05 NOTE — Telephone Encounter (Signed)
Left message on voice mail and left message with husband about 12-4 MRI at 10 am and 12-5 PET at 10 am in radiology here. She is aware to be NPO 12-5 for 6 hrs prior.

## 2011-11-05 NOTE — Assessment & Plan Note (Signed)
Better. Restart Duragesic, mainly to help to control L axilla adenopathy pain

## 2011-11-05 NOTE — Assessment & Plan Note (Signed)
Better - cont w/Rx

## 2011-11-05 NOTE — Assessment & Plan Note (Signed)
Continue with current prescription therapy as reflected on the Med list.  

## 2011-11-05 NOTE — Assessment & Plan Note (Addendum)
11/12 - after interferon  May try Zelboraf - Dr Myna Hidalgo Discussed. It is a difficult situation. Re-staging PET scan is ordered

## 2011-11-05 NOTE — Progress Notes (Signed)
  Subjective:    Patient ID: Susan Mccarthy, female    DOB: 01/19/1951, 60 y.o.   MRN: [id number]  HPI F/u R CP and rib fx's C/o L axilla LN pain - bad, worse over past 2 wks C/o rash on R face  and L arm, lips x 2 wks - scaly, itching F/u Vit B12 and D def (using inj Vit B12 qd lately)  Review of Systems  Constitutional: Positive for fatigue. Negative for fever, chills, diaphoresis, activity change, appetite change and unexpected weight change.  HENT: Negative for hearing loss, ear pain, nosebleeds, congestion, sore throat, facial swelling, rhinorrhea, sneezing, mouth sores, trouble swallowing, neck pain, neck stiffness, postnasal drip, sinus pressure and tinnitus.   Eyes: Negative for pain, discharge, redness, itching and visual disturbance.  Respiratory: Negative for cough, chest tightness, shortness of breath, wheezing and stridor.   Cardiovascular: Positive for chest pain (R anter lower chest). Negative for palpitations and leg swelling.  Gastrointestinal: Negative for nausea, diarrhea, constipation, blood in stool, abdominal distention, anal bleeding and rectal pain.  Genitourinary: Negative for dysuria, urgency, frequency, hematuria, flank pain, vaginal bleeding, vaginal discharge, difficulty urinating, genital sores and pelvic pain.  Musculoskeletal: Negative for back pain, joint swelling, arthralgias and gait problem.  Skin: Positive for rash. Negative for wound.  Neurological: Negative for dizziness, tremors, seizures, syncope, speech difficulty, weakness, numbness and headaches.  Hematological: Positive for adenopathy. Does not bruise/bleed easily.  Psychiatric/Behavioral: Negative for suicidal ideas, behavioral problems, sleep disturbance, dysphoric mood and decreased concentration. The patient is nervous/anxious.    Wt Readings from Last 3 Encounters:  11/05/11 138 lb (62.596 kg)  10/29/11 137 lb (62.143 kg)  10/24/11 138 lb 8 oz (62.823 kg)       Objective:   Physical  Exam  Constitutional: She appears well-developed and well-nourished. No distress.       upset  HENT:  Head: Normocephalic.  Right Ear: External ear normal.  Left Ear: External ear normal.  Nose: Nose normal.  Mouth/Throat: Oropharynx is clear and moist.  Eyes: Conjunctivae are normal. Pupils are equal, round, and reactive to light. Right eye exhibits no discharge. Left eye exhibits no discharge.  Neck: Normal range of motion. Neck supple. No JVD present. No tracheal deviation present. No thyromegaly present.  Cardiovascular: Normal rate, regular rhythm and normal heart sounds.  Exam reveals no gallop.   No murmur heard. Pulmonary/Chest: No stridor. No respiratory distress. She has no wheezes. She has no rales. She exhibits tenderness (R chest).  Abdominal: Soft. Bowel sounds are normal. She exhibits no distension and no mass. There is no tenderness. There is no rebound and no guarding.  Musculoskeletal: She exhibits tenderness. She exhibits no edema.  Lymphadenopathy:    She has no cervical adenopathy.  Neurological: She displays normal reflexes. No cranial nerve deficit. She exhibits normal muscle tone. Coordination normal.  Skin: Rash (1/4 R upper face, L forearm w/confluent paules w/erythema and scaling) noted. No erythema.  Psychiatric: She has a normal mood and affect. Her behavior is normal. Judgment and thought content normal.       Tearful, upset  Two tender LN' 1 cm in L anterior axilla, tender        Assessment & Plan:    A complex case

## 2011-11-05 NOTE — Assessment & Plan Note (Signed)
Start Triamc tid

## 2011-11-05 NOTE — Assessment & Plan Note (Signed)
Start q 14 d sq

## 2011-11-06 ENCOUNTER — Ambulatory Visit (HOSPITAL_BASED_OUTPATIENT_CLINIC_OR_DEPARTMENT_OTHER)
Admission: RE | Admit: 2011-11-06 | Discharge: 2011-11-06 | Disposition: A | Discharge status: Home / Self Care | Payer: BC Managed Care – PPO | Source: Ambulatory Visit | Attending: Hematology & Oncology | Admitting: Hematology & Oncology

## 2011-11-06 DIAGNOSIS — C439 Malignant melanoma of skin, unspecified: Secondary | ICD-10-CM

## 2011-11-06 NOTE — Progress Notes (Signed)
DIAGNOSIS:  Metastatic melanoma.  NARRATIVE:  On November 27, Ms. Haynie completed her chest CT scan.  On my initial consultation last week, the patient was felt to have palpable adenopathy in the left axillary region.  The patient's chest CT scan did confirm left axillary adenopathy and in addition pulmonary metastasis. In light of this, I did speak with Dr. Myna Hidalgo, who agrees with the patient not proceeding with right axillary irradiation.  The patient is being evaluated for other systemic therapy at this time.    ______________________________ [name], Ph.D., M.D. JDK/MEDQ  D:  10/31/2011  T:  11/06/2011  Job:  [id number]

## 2011-11-07 ENCOUNTER — Inpatient Hospital Stay (HOSPITAL_BASED_OUTPATIENT_CLINIC_OR_DEPARTMENT_OTHER): Admission: RE | Admit: 2011-11-07 | Payer: BC Managed Care – PPO | Source: Ambulatory Visit

## 2011-11-07 ENCOUNTER — Telehealth: Payer: Self-pay | Admitting: *Deleted

## 2011-11-07 NOTE — Telephone Encounter (Signed)
Pt cx 12-5 PET moved to 12-19 RN aware pt cancelled PET

## 2011-11-08 ENCOUNTER — Telehealth: Payer: Self-pay | Admitting: *Deleted

## 2011-11-08 NOTE — Telephone Encounter (Signed)
Pt request a call back from [facility]; stating "need to talk w/Dr Plotnikov". LMOM for Pt to give  More general information as to what this concerns.

## 2011-11-08 NOTE — Telephone Encounter (Signed)
Pt aware of 11-15-11 1230 pm PET at [facility] and to be NPO 6 hrs. Linda aware

## 2011-11-12 ENCOUNTER — Telehealth: Payer: Self-pay | Admitting: *Deleted

## 2011-11-12 NOTE — Telephone Encounter (Addendum)
Pt called stating that she is having her PET Scan done at [facility] on Thurs. She is going to [facility] and needs a copy of that and her MRI placed on CD. Told her to let the radiology department know she is going to [facility] and they will make a copy for her. She also wanted to know if she should be taking Zelboraf. Explained that Dr Myna Hidalgo recommends that she does. She then wanted to know why it hadn't been called into her pharmacy. After a prolonged discussion with the patient, Stevenson Clinch, RN spoke to her as she talked to [employer] who called last week to report that the pt declined the shipment of the medication. The pt told her that she did not recall speaking to anyone about this and needed to do some "investigating of her own" as she doesn't think this happened. Alvino Chapel provided her with the phone # with her to call and went over the conversation they had last week. A while later the pt called back to say that she Alvino Chapel) was right and she had declined the medication from [employer]. Prior to the pt calling [employer] called to let the office know that shipment had been arranged but that they recommended a baseline EKG due to a possible Category D interaction between Prozac and Zelboraf. Will set this set up per Dr Myna Hidalgo.

## 2011-11-13 ENCOUNTER — Encounter: Payer: Self-pay | Admitting: *Deleted

## 2011-11-13 DIAGNOSIS — C799 Secondary malignant neoplasm of unspecified site: Secondary | ICD-10-CM

## 2011-11-13 DIAGNOSIS — Z789 Other specified health status: Secondary | ICD-10-CM

## 2011-11-15 ENCOUNTER — Encounter (HOSPITAL_COMMUNITY): Payer: Self-pay

## 2011-11-15 ENCOUNTER — Ambulatory Visit (HOSPITAL_COMMUNITY)
Admission: RE | Admit: 2011-11-15 | Discharge: 2011-11-15 | Disposition: A | Discharge status: Home / Self Care | Payer: BC Managed Care – PPO | Source: Ambulatory Visit | Attending: Hematology & Oncology | Admitting: Hematology & Oncology

## 2011-11-15 ENCOUNTER — Other Ambulatory Visit: Payer: Self-pay

## 2011-11-15 DIAGNOSIS — C439 Malignant melanoma of skin, unspecified: Secondary | ICD-10-CM | POA: Insufficient documentation

## 2011-11-15 DIAGNOSIS — S2249XA Multiple fractures of ribs, unspecified side, initial encounter for closed fracture: Secondary | ICD-10-CM | POA: Insufficient documentation

## 2011-11-15 DIAGNOSIS — E079 Disorder of thyroid, unspecified: Secondary | ICD-10-CM | POA: Insufficient documentation

## 2011-11-15 DIAGNOSIS — X58XXXA Exposure to other specified factors, initial encounter: Secondary | ICD-10-CM | POA: Insufficient documentation

## 2011-11-15 DIAGNOSIS — R599 Enlarged lymph nodes, unspecified: Secondary | ICD-10-CM | POA: Insufficient documentation

## 2011-11-15 DIAGNOSIS — C7800 Secondary malignant neoplasm of unspecified lung: Secondary | ICD-10-CM | POA: Insufficient documentation

## 2011-11-15 DIAGNOSIS — Z9221 Personal history of antineoplastic chemotherapy: Secondary | ICD-10-CM | POA: Insufficient documentation

## 2011-11-15 MED ORDER — FLUDEOXYGLUCOSE F - 18 (FDG) INJECTION
18.0000 | Freq: Once | INTRAVENOUS | Status: AC | PRN
  Administered 2011-11-15: 18 via INTRAVENOUS

## 2011-11-21 ENCOUNTER — Other Ambulatory Visit (HOSPITAL_BASED_OUTPATIENT_CLINIC_OR_DEPARTMENT_OTHER): Payer: BC Managed Care – PPO

## 2011-12-03 ENCOUNTER — Telehealth: Payer: Self-pay | Admitting: Internal Medicine

## 2011-12-03 ENCOUNTER — Ambulatory Visit: Payer: BC Managed Care – PPO | Admitting: Internal Medicine

## 2011-12-03 MED ORDER — AZITHROMYCIN 250 MG PO TABS: ORAL_TABLET | ORAL | Status: DC

## 2011-12-03 NOTE — Telephone Encounter (Signed)
Patient spouse is stating patient has head congestion and is requesting an antibiotic.

## 2011-12-03 NOTE — Telephone Encounter (Signed)
Ok zpac Thx 

## 2011-12-11 ENCOUNTER — Other Ambulatory Visit: Payer: Self-pay | Admitting: *Deleted

## 2011-12-11 DIAGNOSIS — C439 Malignant melanoma of skin, unspecified: Secondary | ICD-10-CM

## 2011-12-11 NOTE — Telephone Encounter (Signed)
Received a fax from [employer] requesting authorization to refill Zelboraf 240 mg (4 tabs BID)). Spoke to pt to get an update on her condition and she stated that she very well with the pills when she first started but over the weekend she developed n/v/d and body aches. Asked if she thought if she could have the flu. She mentioned that she did have a head cold a few days before that. She decreased her dose of Zelboraf to 2 tabs bid instead of 4 tabs bid since becoming sick because she thinks it could be from taking it. Will review with Dr Myna Hidalgo & get back to her tommorow.

## 2011-12-12 ENCOUNTER — Other Ambulatory Visit: Payer: Self-pay | Admitting: *Deleted

## 2011-12-12 DIAGNOSIS — C439 Malignant melanoma of skin, unspecified: Secondary | ICD-10-CM

## 2011-12-12 MED ORDER — VEMURAFENIB 240 MG PO TABS: 960.0000 mg | ORAL_TABLET | Freq: Two times a day (BID) | ORAL | Status: DC

## 2011-12-12 MED ORDER — OXYCODONE-ACETAMINOPHEN 5-325 MG PO TABS: 1.0000 | ORAL_TABLET | Freq: Four times a day (QID) | ORAL | Status: DC | PRN

## 2011-12-12 NOTE — Telephone Encounter (Signed)
Spoke to pt today regarding her symptoms. She is feeling better but is still having some body aches. Wants to continue taking Zelboraf 2 tabs BID instead of 4 tabs BID for now. Explained that would be fine but she needs to keep [place] updated on how she is doing. Reiterated that she will achieve the most benefit from staying on the prescribed dose. Confirmed that she has pain medication at home. She did ask for a refill. Rx request for Percocet sent to Dr Myna Hidalgo for refill. She has 24 pills left. Payge will come by one day this week to pick up the rx and verbalized understanding of the information.

## 2011-12-12 NOTE — Telephone Encounter (Signed)
1045 - Called the pt, no answer. Left message on voicemail stating that Dr Myna Hidalgo and the pharmacist feel as though she had the flu and her sx were most likely not related to the Zelboraf (especially since she tolerated it well the 1st month without any side effects). To continue taking the rx as prescribed if she is feeling ok but can continue at 50% if she is still having sx. Asked that she call back to provide an update on her status. Her rx was refilled so she would not run out of medication.

## 2012-01-17 ENCOUNTER — Telehealth: Payer: Self-pay | Admitting: Hematology & Oncology

## 2012-01-17 NOTE — Telephone Encounter (Signed)
I had a very long talk w/ Susan Mccarthy.  I wanted to see how she was doing.  We had not seen her for several months.  She is on Zelboraf..  She spoke candidly about her concerns.  I really did my best to try to make her feel better.  I told her to let me know when she can come in and we will see her whenever it is convenient for her.   I told her that we missed her and that she is family and that we really need to get her in for a viist.    She told me that she will call back and will come in next week. I told her to call Amy and let her know.   Susan Mccarthy is under a lot of stress and i want to try to make it easier for her!!!  At the end of out call, Susan Mccarthy was very thankful for my phone call and thanked me for taking the time to call her and listen to her!!  I spent a good 30 min on the phone. [name]

## 2012-01-25 ENCOUNTER — Telehealth: Payer: Self-pay | Admitting: Hematology & Oncology

## 2012-01-25 NOTE — Telephone Encounter (Signed)
RN aware Patient made 02-01-12 appointment

## 2012-02-01 ENCOUNTER — Encounter: Payer: Self-pay | Admitting: *Deleted

## 2012-02-01 ENCOUNTER — Telehealth: Payer: Self-pay | Admitting: Hematology & Oncology

## 2012-02-01 ENCOUNTER — Ambulatory Visit: Payer: BC Managed Care – PPO | Admitting: Hematology & Oncology

## 2012-02-01 ENCOUNTER — Other Ambulatory Visit: Payer: BC Managed Care – PPO | Admitting: Lab

## 2012-02-01 NOTE — Progress Notes (Unsigned)
Received call from pt's husband.  He canceled pt's appt for today because she was "too sick to come in".  Asked if we could call in some pain medication for Ms Redler.  I explained that we could not call in Percocet, that someone would have to pick it up.  Mr Rieger stated he is 65 miles away, working and could not pick up a prescription.  Asked if we could call something for pain besides Percocet.  Explained to him that pain medication not as strong as the Percocet may not ease her pain. Husband states she has been out of her pain medication for awhile, he did not know how long it had been. Pt had called for a prescription for Perocet on 12/12/11 and never picked it up.  He indicated that he just wanted to help her and would we please call something in.  Per Dr Myna Hidalgo, pt needs to be evaluated before just giving her the same pain medication.  Pt has not been seen recently.  Called husband back and again explained the need for her to come in.  Told him we could make her feel much better faster if she would come to the office and get IV fluids, IV pain medications and IV antiemetic.  I offered to call pt and see if I could talk her into coming to office.  When I spoke to Ms Yaun and explained the above to her she shouted at me that she was not coming in today.  Explained that we could call the non emergency ambulance for her and they would transport her here and back home again.  She refused and said she would have to take a bath and wash her hair before she came into the office.  I offered that when her husband gets home from work around 5pm they could go to the ER.  Pt again shouted that she was not going to do that. I called Mr Bermea back and told him of my conversation with pt.  He states he will bring her in to the office next week.  Raiford Noble, the scheduler, will call him and get that scheduled.  This conversation was witnessed by [employer] RN.

## 2012-02-01 NOTE — Telephone Encounter (Signed)
Husband aware of 02-05-12 appointment

## 2012-02-01 NOTE — Telephone Encounter (Signed)
Pt's husband called and cx apt due to pt not feeling well.  He stated she will call back to resch.  Nurse was notified of cx apt

## 2012-02-05 ENCOUNTER — Ambulatory Visit (HOSPITAL_BASED_OUTPATIENT_CLINIC_OR_DEPARTMENT_OTHER): Payer: BC Managed Care – PPO | Admitting: Hematology & Oncology

## 2012-02-05 ENCOUNTER — Other Ambulatory Visit (HOSPITAL_BASED_OUTPATIENT_CLINIC_OR_DEPARTMENT_OTHER): Payer: BC Managed Care – PPO | Admitting: Lab

## 2012-02-05 VITALS — BP 109/76 | HR 90 | Temp 97.1°F | Ht 65.0 in | Wt 144.0 lb

## 2012-02-05 DIAGNOSIS — C7800 Secondary malignant neoplasm of unspecified lung: Secondary | ICD-10-CM

## 2012-02-05 DIAGNOSIS — C439 Malignant melanoma of skin, unspecified: Secondary | ICD-10-CM

## 2012-02-05 DIAGNOSIS — C773 Secondary and unspecified malignant neoplasm of axilla and upper limb lymph nodes: Secondary | ICD-10-CM

## 2012-02-05 DIAGNOSIS — R0789 Other chest pain: Secondary | ICD-10-CM

## 2012-02-05 LAB — CBC WITH DIFFERENTIAL (CANCER CENTER ONLY)
BASO%: 1.6 % (ref 0.0–2.0)
LYMPH%: 11.8 % — ABNORMAL LOW (ref 14.0–48.0)
MCH: 30.3 pg (ref 26.0–34.0)
MCV: 84 fL (ref 81–101)
MONO%: 20.1 % — ABNORMAL HIGH (ref 0.0–13.0)
Platelets: 184 10*3/uL (ref 145–400)
RDW: 13 % (ref 11.1–15.7)
WBC: 2.5 10*3/uL — ABNORMAL LOW (ref 3.9–10.0)

## 2012-02-05 LAB — COMPREHENSIVE METABOLIC PANEL
ALT: 97 U/L — ABNORMAL HIGH (ref 0–35)
Alkaline Phosphatase: 288 U/L — ABNORMAL HIGH (ref 39–117)
Sodium: 128 mEq/L — ABNORMAL LOW (ref 135–145)
Total Bilirubin: 0.7 mg/dL (ref 0.3–1.2)
Total Protein: 6.8 g/dL (ref 6.0–8.3)

## 2012-02-05 MED ORDER — OXYCODONE-ACETAMINOPHEN 5-325 MG PO TABS: 1.0000 | ORAL_TABLET | Freq: Four times a day (QID) | ORAL | Status: DC | PRN

## 2012-02-05 NOTE — Progress Notes (Signed)
CC:   Susan Gosling, MD Susan Quint Plotnikov, MD Susan Naas, MD  DIAGNOSIS:  Metastatic melanoma.  CURRENT THERAPY:  Zelboraf 960 mg p.o. b.i.d.  INTERVAL HISTORY:  Susan Mccarthy comes in for a long awaited followup.  We last saw her back in November.  Since then, she has been to [facility].  She has had studies done.  Shockingly enough, we did find that her cancer had recurred.  She had a PET scan that showed metastatic disease.  This was after she had high dose interferon.  She had developed pulmonary mets.  She developed new left axillary metastasis.  There was a questionable thyroid lesion.  She is on Zelboraf now.  She actually has done very well with this.  Due to factors which are still hard to explain, we have not been able to get her back until now.  She is under an incredible amount of stress at home.  There have been issues with her husband.  She is trying to "keep it together."  She is on quite a few medications.  She does have an element of depression.  She is on Prozac.  She is also on some Klonopin.  She has had no nausea or vomiting.  She does have occasional bouts of pain.  She is on Percocet for pain.  She has not had any diarrhea.  There has been no bleeding.  She has had no headache.  There has been no double vision or blurred vision.  PHYSICAL EXAM:  This is a well-developed well-nourished white female in no obvious distress.  Vital signs:  Temperature 97.1, pulse 90, respiratory rate 20, blood pressure 109/76, weight is 144.  Head and neck:  She has a normocephalic, atraumatic skull.  There are no ocular or oral lesions.  There are no palpable cervical or supraclavicular lymph nodes.  Lungs:  Clear bilaterally.  Cardiac:  Regular rate and rhythm with normal S1, S2.  There are no murmurs, rubs or bruits. Axillary:  Some slight fullness in the right axilla.  This may be scar tissue.  I really cannot feel anything in the left axilla.  Abdomen: Soft with  good bowel sounds.  There is no palpable abdominal mass. There is no palpable hepatosplenomegaly.  Extremities:  No clubbing, cyanosis or edema.  She has good range motion of her joints.  Skin: Some acanthomas from the Zelboraf.  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  She does have some dry skin.  Neurological:  No focal neurological deficits.  LABORATORY STUDIES:  White cell count is 2.5, hemoglobin 13.8, hematocrit 38.5, platelet count 184.  IMPRESSION:  Susan Mccarthy is a 61 year old white female with metastatic melanoma.  This occurred despite being on interferon.  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  As such, she is on Zelboraf.  By looking at her, she really looks good.  She looks much better than she did when we last saw her in November.  I have to believe that she is responding.  Will go ahead and plan to get another PET scan on her.  I think this would be a good way for [place] to identify a response.  Will plan to get her back in 3 weeks for followup.  I did refill her Percocet.  I talked to her for a good 45 minutes today.  I want to let her know that she does not have to go through this alone and that  we are here as an "outlet" for stress that she is building up.    ______________________________ Susan Mccarthy, M.D. PRE/MEDQ  D:  02/05/2012  T:  02/05/2012  Job:  [id number]

## 2012-02-06 ENCOUNTER — Telehealth: Payer: Self-pay | Admitting: Hematology & Oncology

## 2012-02-06 NOTE — Telephone Encounter (Signed)
Mailed completed claim form to [employer] per request.

## 2012-02-06 NOTE — Telephone Encounter (Signed)
Pt is aware of 3-15 PET at [facility] 1115 pm and to be NPO 6 hrs prior. She said she is not a diabetic. Pt is also aware of 03-03-12 MD appointment

## 2012-02-11 ENCOUNTER — Other Ambulatory Visit: Payer: Self-pay | Admitting: Dermatology

## 2012-02-12 ENCOUNTER — Telehealth: Payer: Self-pay | Admitting: Hematology & Oncology

## 2012-02-12 NOTE — Telephone Encounter (Signed)
Faxed last 2 office visit notes to [facility] @ Dr. Dorita Sciara office per phoned request.

## 2012-02-14 ENCOUNTER — Telehealth: Payer: Self-pay | Admitting: *Deleted

## 2012-02-14 NOTE — Telephone Encounter (Signed)
Rf req for Clonazepam 1 mg 1 po tid prn anxiety. Ok to Rf?

## 2012-02-15 ENCOUNTER — Encounter (HOSPITAL_COMMUNITY)
Admission: RE | Admit: 2012-02-15 | Discharge: 2012-02-15 | Disposition: A | Discharge status: Home / Self Care | Payer: BC Managed Care – PPO | Source: Ambulatory Visit | Attending: Hematology & Oncology | Admitting: Hematology & Oncology

## 2012-02-15 DIAGNOSIS — C439 Malignant melanoma of skin, unspecified: Secondary | ICD-10-CM

## 2012-02-15 DIAGNOSIS — Z79899 Other long term (current) drug therapy: Secondary | ICD-10-CM | POA: Insufficient documentation

## 2012-02-15 DIAGNOSIS — E079 Disorder of thyroid, unspecified: Secondary | ICD-10-CM | POA: Insufficient documentation

## 2012-02-15 MED ORDER — FLUDEOXYGLUCOSE F - 18 (FDG) INJECTION
17.1000 | Freq: Once | INTRAVENOUS | Status: AC | PRN
  Administered 2012-02-15: 17.1 via INTRAVENOUS

## 2012-02-15 MED ORDER — CLONAZEPAM 1 MG PO TABS: 1.0000 mg | ORAL_TABLET | Freq: Three times a day (TID) | ORAL | Status: DC | PRN

## 2012-02-15 NOTE — Telephone Encounter (Signed)
Done

## 2012-02-15 NOTE — Telephone Encounter (Signed)
OK to fill this prescription with additional refills x2 Thank you!  

## 2012-02-20 ENCOUNTER — Other Ambulatory Visit: Payer: Self-pay | Admitting: Dermatology

## 2012-02-22 ENCOUNTER — Telehealth: Payer: Self-pay | Admitting: *Deleted

## 2012-02-22 NOTE — Telephone Encounter (Signed)
Per Dr. Myna Hidalgo, pt called and told her melanoma is getting a lot better.  Zelboraf is doing a good job.  Pt was very appreciative.

## 2012-02-22 NOTE — Telephone Encounter (Signed)
Message copied by Mirian Capuchin on Fri Feb 22, 2012  4:26 PM ------      Message from: [name]      Created: Tue Feb 19, 2012  9:36 PM       Call - melanoma is getting a lot better!!!!  zelboraf is doing a good job!! Susan Mccarthy

## 2012-03-03 ENCOUNTER — Telehealth: Payer: Self-pay | Admitting: Hematology & Oncology

## 2012-03-03 ENCOUNTER — Other Ambulatory Visit: Payer: BC Managed Care – PPO | Admitting: Lab

## 2012-03-03 ENCOUNTER — Ambulatory Visit: Payer: BC Managed Care – PPO | Admitting: Hematology & Oncology

## 2012-03-03 NOTE — Telephone Encounter (Signed)
Pt called cx 4-1 moved to 4-19 said she was sick with the GI thing, left RN message

## 2012-03-08 ENCOUNTER — Other Ambulatory Visit: Payer: Self-pay | Admitting: Internal Medicine

## 2012-03-10 ENCOUNTER — Other Ambulatory Visit: Payer: Self-pay | Admitting: *Deleted

## 2012-03-10 DIAGNOSIS — C439 Malignant melanoma of skin, unspecified: Secondary | ICD-10-CM

## 2012-03-10 DIAGNOSIS — R0789 Other chest pain: Secondary | ICD-10-CM

## 2012-03-10 MED ORDER — OXYCODONE-ACETAMINOPHEN 5-325 MG PO TABS: 1.0000 | ORAL_TABLET | Freq: Four times a day (QID) | ORAL | Status: DC | PRN

## 2012-03-11 ENCOUNTER — Other Ambulatory Visit: Payer: Self-pay | Admitting: *Deleted

## 2012-03-11 DIAGNOSIS — R0789 Other chest pain: Secondary | ICD-10-CM

## 2012-03-11 DIAGNOSIS — C439 Malignant melanoma of skin, unspecified: Secondary | ICD-10-CM

## 2012-03-11 MED ORDER — OXYCODONE-ACETAMINOPHEN 5-325 MG PO TABS: 1.0000 | ORAL_TABLET | Freq: Four times a day (QID) | ORAL | Status: DC | PRN

## 2012-03-11 NOTE — Telephone Encounter (Signed)
Re-issued the Percocet rx the pt requested yesterday. It was approved by Dr Myna Hidalgo but didn't print because of the time. Will have Dr Myna Hidalgo sign this one and place up front for the pt to sign. When the pt came here to pick up her rx, she was reminded of her appt on the 19th and stated she would be coming.

## 2012-03-21 ENCOUNTER — Ambulatory Visit (HOSPITAL_BASED_OUTPATIENT_CLINIC_OR_DEPARTMENT_OTHER): Payer: BC Managed Care – PPO | Admitting: Hematology & Oncology

## 2012-03-21 ENCOUNTER — Other Ambulatory Visit (HOSPITAL_BASED_OUTPATIENT_CLINIC_OR_DEPARTMENT_OTHER): Payer: BC Managed Care – PPO | Admitting: Lab

## 2012-03-21 DIAGNOSIS — C7800 Secondary malignant neoplasm of unspecified lung: Secondary | ICD-10-CM

## 2012-03-21 DIAGNOSIS — C439 Malignant melanoma of skin, unspecified: Secondary | ICD-10-CM

## 2012-03-21 DIAGNOSIS — C4359 Malignant melanoma of other part of trunk: Secondary | ICD-10-CM

## 2012-03-21 DIAGNOSIS — C773 Secondary and unspecified malignant neoplasm of axilla and upper limb lymph nodes: Secondary | ICD-10-CM

## 2012-03-21 DIAGNOSIS — L989 Disorder of the skin and subcutaneous tissue, unspecified: Secondary | ICD-10-CM

## 2012-03-21 LAB — COMPREHENSIVE METABOLIC PANEL
AST: 34 U/L (ref 0–37)
BUN: 5 mg/dL — ABNORMAL LOW (ref 6–23)
Calcium: 10 mg/dL (ref 8.4–10.5)
Chloride: 95 mEq/L — ABNORMAL LOW (ref 96–112)
Creatinine, Ser: 0.71 mg/dL (ref 0.50–1.10)
Glucose, Bld: 89 mg/dL (ref 70–99)

## 2012-03-21 LAB — CBC WITH DIFFERENTIAL (CANCER CENTER ONLY)
BASO#: 0 10*3/uL (ref 0.0–0.2)
EOS%: 10.2 % — ABNORMAL HIGH (ref 0.0–7.0)
HCT: 38.6 % (ref 34.8–46.6)
HGB: 13.6 g/dL (ref 11.6–15.9)
LYMPH#: 0.8 10*3/uL — ABNORMAL LOW (ref 0.9–3.3)
MCH: 29.4 pg (ref 26.0–34.0)
MCHC: 35.2 g/dL (ref 32.0–36.0)
MONO%: 11.6 % (ref 0.0–13.0)
NEUT%: 57.5 % (ref 39.6–80.0)

## 2012-03-21 NOTE — Progress Notes (Signed)
This office note has been dictated.

## 2012-03-21 NOTE — Progress Notes (Signed)
CC:   Georgina Quint. Plotnikov, MD [name], MD [name], MD  DIAGNOSIS:  Metastatic melanoma.  CURRENT THERAPY:  Zelboraf 960 mg p.o. b.i.d.  INTERIM HISTORY:  Ms. Belanger comes in for her followup.  She continues to do well.  She actually tolerated Zelboraf well.  She sees Dr. Terri Piedra from dermatology.  He has taken off a couple lesions.  He will maintain close followup with her because of the skin issues related to the Zelboraf.  Unfortunately, Ms. Wittler is having a lot of issues at home with her husband.  She says that she is going to leave him and try to move back up to [place] where her family is from.  I certainly cannot blame her. She is not sure when this will happen.  I told her that when she felt this was necessary I think it wise to get her with an oncologist up in the [place] area.  She has had no cough.  She has had decrease in pain.  There have been no problem with bowels or bladder.  We recently did a PET scan on her.  This was done on March 15.  The PET scan showed interval response to therapy.  She has had regression of hypermetabolic left axillary lymph nodes.  Also noted has been regression of hypermetabolic pulmonary nodules.  She still has a hypermetabolic right thyroid lesion but this appears to be somewhat improved.  PHYSICAL EXAMINATION:  General:  This is a well-developed, well- nourished white female in no obvious distress.  Vital signs:  97.4, pulse 73, respiratory rate 18, blood pressure 129/84.  Weight is 137. Head and neck:  Exam shows a normocephalic, atraumatic skull.  There are no ocular or oral lesions.  There are no palpable cervical or supraclavicular lymph nodes.  Lungs:  Clear bilaterally.  Cardiac: Regular rate and rhythm with a normal S1 and S2.  There are no murmurs, rubs or bruits.  Abdomen:  Soft with good bowel sounds.  There is no palpable abdominal mass.  There is no fluid wave.  There is no  palpable hepatosplenomegaly.  Back:  Exam shows the wide local excision on the right I think mid back.  There is no tenderness over the spine, ribs or hips.  Axillary:  Exam shows some slight fullness in the right axilla. No distinct masses are noted however.  There is no left axillary adenopathy.  Skin:  Exam does show some actinic keratoses.  There may be a squamous cell carcinoma noted on her left back.  LABORATORY STUDIES:  White cell count 3.7, hemoglobin 13.6, hematocrit 38.6, platelet count 220.  IMPRESSION:  Ms. Verley is a 61 year old white female with metastatic melanoma.  She has done well with Zelboraf.  She is still responding to Zelboraf.  For now, we will continue to follow her on the Zelboraf.  I do not see that we need to make any changes with respect to her therapy.  Again, she will let us know about her move.  Whatever works for her, we can certainly accommodate.  I would like to try to see her back in a couple of months if possible.  She says that she does not need any pain medication today.  She will let us know if that is necessary in the future.    ______________________________ [name], M.D. PRE/MEDQ  D:  03/21/2012  T:  03/21/2012  Job:  [id number]

## 2012-04-04 ENCOUNTER — Other Ambulatory Visit: Payer: Self-pay | Admitting: *Deleted

## 2012-04-04 DIAGNOSIS — C439 Malignant melanoma of skin, unspecified: Secondary | ICD-10-CM

## 2012-04-04 MED ORDER — VEMURAFENIB 240 MG PO TABS: 960.0000 mg | ORAL_TABLET | Freq: Two times a day (BID) | ORAL | Status: DC

## 2012-04-08 ENCOUNTER — Other Ambulatory Visit: Payer: Self-pay | Admitting: *Deleted

## 2012-04-08 DIAGNOSIS — C439 Malignant melanoma of skin, unspecified: Secondary | ICD-10-CM

## 2012-04-08 DIAGNOSIS — R0789 Other chest pain: Secondary | ICD-10-CM

## 2012-04-08 MED ORDER — OXYCODONE-ACETAMINOPHEN 5-325 MG PO TABS: 1.0000 | ORAL_TABLET | Freq: Four times a day (QID) | ORAL | Status: DC | PRN

## 2012-04-08 NOTE — Telephone Encounter (Signed)
Pt called to check on her rx for Zelboraf. Called [facility] and confirmed receipt of rx. Pt needs to call [phone] to set up shipment arrangements. The representative at [facility] said they left her a message on 5/1 trying to set this up and is on the list for tomorrow if she doesn't call today. She stated they automatically call the pt about 3 weeks into their rx to remind get the next shipment started despite the need for a refill authorization. Theresea agreed to call the office when she receives this call in case a reminder fax is not sent to the office to fill her rx. Also will call [facility] today to get her shipment set up.  While on the phone she asked for a Percocet refill. Will route rx request to Dr Myna Hidalgo for approval and place at the front desk for pick up when ready.

## 2012-04-09 NOTE — Telephone Encounter (Signed)
Patient called wanting refill of her Zelboraf.  Arranged refill with [facility]

## 2012-04-14 ENCOUNTER — Telehealth: Payer: Self-pay | Admitting: *Deleted

## 2012-04-14 DIAGNOSIS — C4359 Malignant melanoma of other part of trunk: Secondary | ICD-10-CM

## 2012-04-14 MED ORDER — PREDNISONE 20 MG PO TABS: 20.0000 mg | ORAL_TABLET | Freq: Every day | ORAL | Status: AC

## 2012-04-14 NOTE — Telephone Encounter (Signed)
Pt left a tearful message earlier today asking for something for fatigue. Reviewed with Dr Myna Hidalgo. To start Prednisone 20 mg daily. Attempted to reach the pt but there was no answer. Left message stating that Dr Myna Hidalgo wants to try her on Prednisone 20 mg daily. Will [facility] to [facility] via [facility]. Asked to let the office know if she develops thrush ie white coating on tongue or "cotton ball mouth" To call back if further questions/concerns.

## 2012-04-17 ENCOUNTER — Other Ambulatory Visit: Payer: Self-pay | Admitting: Dermatology

## 2012-05-02 ENCOUNTER — Other Ambulatory Visit: Payer: Self-pay | Admitting: *Deleted

## 2012-05-02 ENCOUNTER — Telehealth: Payer: Self-pay | Admitting: *Deleted

## 2012-05-02 MED ORDER — DICLOFENAC SODIUM 1 % TD GEL: 1.0000 "application " | Freq: Four times a day (QID) | TRANSDERMAL | Status: DC

## 2012-05-02 NOTE — Telephone Encounter (Signed)
Voltaren PA approved 04/24/12-01/18/2015.

## 2012-05-02 NOTE — Telephone Encounter (Signed)
PA for Voltaren gel approved. Pharmacy informed. Left detailed mess informing pt.

## 2012-05-08 ENCOUNTER — Other Ambulatory Visit: Payer: Self-pay | Admitting: *Deleted

## 2012-05-08 DIAGNOSIS — R0789 Other chest pain: Secondary | ICD-10-CM

## 2012-05-08 DIAGNOSIS — C439 Malignant melanoma of skin, unspecified: Secondary | ICD-10-CM

## 2012-05-08 MED ORDER — OXYCODONE-ACETAMINOPHEN 5-325 MG PO TABS: 1.0000 | ORAL_TABLET | Freq: Four times a day (QID) | ORAL | Status: DC | PRN

## 2012-05-08 NOTE — Telephone Encounter (Signed)
Pt called on 05/07/12 requesting a refill for Oxycodone. Spoke to her today at 64. Asked how she was doing and she stated "good". The reason why she was asked was because when she called yesterday 2 people were heard in the background arguing after she had asked for her request. It was hard to understand what was initially said but at the end of the message another female voice said "now they are going to f*ck this up". Did not discuss this with Susan Mccarthy as she stated she was "good" and it appeared as though she didn't know the phone hadn't been hung up after she had asked for the request. Pt also denied having any side effects from the Zelboraf.

## 2012-05-14 ENCOUNTER — Telehealth: Payer: Self-pay | Admitting: *Deleted

## 2012-05-14 MED ORDER — CLONAZEPAM 1 MG PO TABS: 1.0000 mg | ORAL_TABLET | Freq: Three times a day (TID) | ORAL | Status: DC | PRN

## 2012-05-14 NOTE — Telephone Encounter (Signed)
Done

## 2012-05-14 NOTE — Telephone Encounter (Signed)
Rf req for Clonazepam 1 mg  1 po tid prn. Ok to Rf?

## 2012-05-14 NOTE — Telephone Encounter (Signed)
OK to fill this prescription with additional refills x1 Sch ROV Thank you!

## 2012-05-15 ENCOUNTER — Ambulatory Visit (HOSPITAL_BASED_OUTPATIENT_CLINIC_OR_DEPARTMENT_OTHER): Payer: BC Managed Care – PPO | Admitting: Hematology & Oncology

## 2012-05-15 ENCOUNTER — Other Ambulatory Visit: Payer: BC Managed Care – PPO | Admitting: Lab

## 2012-05-15 VITALS — BP 115/76 | HR 91 | Temp 98.1°F | Ht 65.0 in | Wt 137.0 lb

## 2012-05-15 DIAGNOSIS — C4359 Malignant melanoma of other part of trunk: Secondary | ICD-10-CM

## 2012-05-15 DIAGNOSIS — F909 Attention-deficit hyperactivity disorder, unspecified type: Secondary | ICD-10-CM

## 2012-05-15 DIAGNOSIS — F411 Generalized anxiety disorder: Secondary | ICD-10-CM

## 2012-05-15 MED ORDER — AMPHETAMINE-DEXTROAMPHETAMINE 10 MG PO TABS: 10.0000 mg | ORAL_TABLET | Freq: Every day | ORAL | Status: DC

## 2012-05-15 NOTE — Progress Notes (Signed)
This office note has been dictated.

## 2012-05-16 ENCOUNTER — Telehealth: Payer: Self-pay | Admitting: Hematology & Oncology

## 2012-05-16 NOTE — Telephone Encounter (Signed)
Left pt message to call for appointment details °

## 2012-05-16 NOTE — Progress Notes (Signed)
CC:   Susan Quint. Plotnikov, MD Susan Gosling, MD Susan Naas, MD  DIAGNOSIS:  Metastatic melanoma.  CURRENT THERAPY:  Zelboraf 960 mg p.o. b.i.d.  INTERIM HISTORY:  Susan Mccarthy comes in for followup.  She seems to be doing fairly well.  She still has not yet moved up to [place].  She is not sure when she is going to do this.  She is still having a lot of problems with anxiety.  She is also having issues with respect to "focusing."  She has had a diagnosis of ADHD in the past.  She has been on Adderall in the past.  We will go ahead and get her back on Adderall.  She is not having any problems with cough. Her appetite is improving. She has not had any problems with skin lesions.  She is being followed by Dermatology.  PHYSICAL EXAMINATION:  This is a well-developed, well-nourished white female in no obvious distress.  Vital signs:  98.7, pulse 91, respiratory rate 20, blood pressure 115/76.  Weight is 137.  Head and neck:  Normocephalic, atraumatic skull.  There are no ocular or oral lesions.  There is no adenopathy in the neck.  Lungs:  Clear bilaterally.  Cardiac:  Regular rate and rhythm with normal S1 and S2. There are no murmurs, rubs or bruits.  Axillary exam shows no bilateral axillary adenopathy.  There is some slight fullness in the right axilla but no distinct nodules or lymph nodes are noted.  Back: No tenderness over the spine, ribs, or hips.  Abdomen:  Soft with good bowel sounds. There is no fluid wave.  There is no guarding or rebound tenderness. There is no palpable hepatosplenomegaly.  Extremities:  No clubbing, cyanosis or edema.  She has good range motion of the joints.  Skin:  No suspicious hyperpigmented lesions.  She may have a couple of actinic keratoses on her back.  LABORATORY STUDIES:  Were not done this visit because of Susan Mccarthy's late arrival.  IMPRESSIONS:  Susan Mccarthy is a 61 year old white female with metastatic melanoma.  She is on  Zelboraf.  We are going to have to repeat a PET scan on her.  We are going to get this set up for a few weeks.  Will plan to get Susan Mccarthy back after her PET scan is done.  She will let us know whether or not she needs to move up to [facility].   ______________________________ [name], M.D. PRE/MEDQ  D:  05/15/2012  T:  05/16/2012  Job:  [id number]

## 2012-05-20 ENCOUNTER — Telehealth: Payer: Self-pay | Admitting: Hematology & Oncology

## 2012-05-20 NOTE — Telephone Encounter (Signed)
Pt aware 7-2 PET and to be NPO 6 hrs and is also aware of 7-8 MD

## 2012-05-22 ENCOUNTER — Telehealth: Payer: Self-pay | Admitting: *Deleted

## 2012-05-22 NOTE — Telephone Encounter (Signed)
Pt called to let AVP know that Lorazepam 1 mg is currently not helping with her anxiety and is wondering why pt was changed from Lorazepam 5mg  to Lorazapem 1mg . Pt informed that MD is out of office until Monday and is willing to wait until his return with advisement from him.

## 2012-05-26 NOTE — Telephone Encounter (Signed)
Lorazepam doesn't come in 5 mg tabs. She was on Alprazolam 0.25 mg The only dose of Lorazepam on record I see is 1 mg Thx

## 2012-05-27 ENCOUNTER — Telehealth: Payer: Self-pay | Admitting: Internal Medicine

## 2012-05-27 NOTE — Telephone Encounter (Signed)
Caller: [place]/Patient; Phone Number: [phone]; Message from caller: Patient states she was prescribed Clonazepam 5mg  but when she went to the pharmacy they had the 1mg  and she wants to know why it was decreased. Also states she was told by her oncologist that she only has a year to live and that has increased her anxiety.

## 2012-05-27 NOTE — Telephone Encounter (Signed)
Pt advised again that Rx never prescribed by AVP and that there is no 5 mg Klonopin. Pt advised to follow up with oncologist or AVP regarding increased anxiety?

## 2012-05-27 NOTE — Telephone Encounter (Signed)
Pt informed of AVP's advisement by Triage B-Dahlia (see 6/25 phone note).

## 2012-05-27 NOTE — Telephone Encounter (Signed)
Left message for pt to callback office.  

## 2012-06-03 ENCOUNTER — Encounter (HOSPITAL_COMMUNITY): Payer: Self-pay

## 2012-06-03 ENCOUNTER — Encounter (HOSPITAL_COMMUNITY)
Admission: RE | Admit: 2012-06-03 | Discharge: 2012-06-03 | Disposition: A | Discharge status: Home / Self Care | Payer: BC Managed Care – PPO | Source: Ambulatory Visit | Attending: Hematology & Oncology | Admitting: Hematology & Oncology

## 2012-06-03 DIAGNOSIS — K449 Diaphragmatic hernia without obstruction or gangrene: Secondary | ICD-10-CM | POA: Insufficient documentation

## 2012-06-03 DIAGNOSIS — K5730 Diverticulosis of large intestine without perforation or abscess without bleeding: Secondary | ICD-10-CM | POA: Insufficient documentation

## 2012-06-03 DIAGNOSIS — C4359 Malignant melanoma of other part of trunk: Secondary | ICD-10-CM | POA: Insufficient documentation

## 2012-06-03 DIAGNOSIS — E041 Nontoxic single thyroid nodule: Secondary | ICD-10-CM | POA: Insufficient documentation

## 2012-06-03 MED ORDER — FLUDEOXYGLUCOSE F - 18 (FDG) INJECTION
18.5000 | Freq: Once | INTRAVENOUS | Status: AC | PRN
  Administered 2012-06-03: 18.5 via INTRAVENOUS

## 2012-06-04 ENCOUNTER — Ambulatory Visit (INDEPENDENT_AMBULATORY_CARE_PROVIDER_SITE_OTHER): Payer: BC Managed Care – PPO | Admitting: Internal Medicine

## 2012-06-04 ENCOUNTER — Encounter: Payer: Self-pay | Admitting: Internal Medicine

## 2012-06-04 VITALS — BP 110/80 | HR 84 | Temp 98.3°F | Resp 16 | Wt 139.0 lb

## 2012-06-04 DIAGNOSIS — E538 Deficiency of other specified B group vitamins: Secondary | ICD-10-CM

## 2012-06-04 DIAGNOSIS — F419 Anxiety disorder, unspecified: Secondary | ICD-10-CM

## 2012-06-04 DIAGNOSIS — C4359 Malignant melanoma of other part of trunk: Secondary | ICD-10-CM

## 2012-06-04 DIAGNOSIS — C439 Malignant melanoma of skin, unspecified: Secondary | ICD-10-CM

## 2012-06-04 DIAGNOSIS — G4700 Insomnia, unspecified: Secondary | ICD-10-CM

## 2012-06-04 DIAGNOSIS — F411 Generalized anxiety disorder: Secondary | ICD-10-CM

## 2012-06-04 DIAGNOSIS — C7800 Secondary malignant neoplasm of unspecified lung: Secondary | ICD-10-CM

## 2012-06-04 DIAGNOSIS — C801 Malignant (primary) neoplasm, unspecified: Secondary | ICD-10-CM

## 2012-06-04 DIAGNOSIS — E041 Nontoxic single thyroid nodule: Secondary | ICD-10-CM

## 2012-06-04 LAB — GLUCOSE, CAPILLARY: Glucose-Capillary: 89 mg/dL (ref 70–99)

## 2012-06-04 MED ORDER — CYANOCOBALAMIN 1000 MCG/ML IJ SOLN: INTRAMUSCULAR | Status: DC

## 2012-06-04 MED ORDER — CYANOCOBALAMIN 1000 MCG/ML IJ SOLN
1000.0000 ug | Freq: Once | INTRAMUSCULAR | Status: AC
  Administered 2012-06-04: 1000 ug via INTRAMUSCULAR

## 2012-06-04 MED ORDER — TRAZODONE HCL 50 MG PO TABS: ORAL_TABLET | ORAL | Status: DC

## 2012-06-04 NOTE — Assessment & Plan Note (Signed)
On Zelboraf - Dr Myna Hidalgo  PET results are pending

## 2012-06-04 NOTE — Assessment & Plan Note (Signed)
Will try Trazodone 

## 2012-06-04 NOTE — Progress Notes (Signed)
Patient ID: Susan Mccarthy, female   DOB: 01/28/1951, 61 y.o.   MRN: [id number]  Subjective:    Patient ID: Susan Mccarthy, female    DOB: February 13, 1951, 61 y.o.   MRN: [id number]  HPI F/u R CP and rib fx's - resolved C/o L axilla LN pain - resolved F/u melanoma C/o insomnia and anxiety F/u Vit B12 and D def (not using inj Vit B12 qd lately x months)  Wt Readings from Last 3 Encounters:  06/04/12 139 lb (63.05 kg)  05/15/12 137 lb (62.143 kg)  02/05/12 144 lb (65.318 kg)   BP Readings from Last 3 Encounters:  06/04/12 110/80  05/15/12 115/76  02/05/12 109/76     Review of Systems  Constitutional: Positive for fatigue. Negative for fever, chills, diaphoresis, activity change, appetite change and unexpected weight change.  HENT: Negative for hearing loss, ear pain, nosebleeds, congestion, sore throat, facial swelling, rhinorrhea, sneezing, mouth sores, trouble swallowing, neck pain, neck stiffness, postnasal drip, sinus pressure and tinnitus.   Eyes: Negative for pain, discharge, redness, itching and visual disturbance.  Respiratory: Negative for cough, chest tightness, shortness of breath, wheezing and stridor.   Cardiovascular: Negative for chest pain (R anter lower chest), palpitations and leg swelling.  Gastrointestinal: Negative for nausea, diarrhea, constipation, blood in stool, abdominal distention, anal bleeding and rectal pain.  Genitourinary: Negative for dysuria, urgency, frequency, hematuria, flank pain, vaginal bleeding, vaginal discharge, difficulty urinating, genital sores and pelvic pain.  Musculoskeletal: Negative for back pain, joint swelling, arthralgias and gait problem.  Skin: Negative for rash and wound.  Neurological: Negative for dizziness, tremors, seizures, syncope, speech difficulty, weakness, numbness and headaches.  Hematological: Negative for adenopathy. Does not bruise/bleed easily.  Psychiatric/Behavioral: Positive for disturbed wake/sleep cycle. Negative  for suicidal ideas, behavioral problems, dysphoric mood and decreased concentration. The patient is nervous/anxious.         Objective:   Physical Exam  Constitutional: She appears well-developed and well-nourished. No distress.       Not upset Tearful a little  HENT:  Head: Normocephalic.  Right Ear: External ear normal.  Left Ear: External ear normal.  Nose: Nose normal.  Mouth/Throat: Oropharynx is clear and moist.  Eyes: Conjunctivae are normal. Pupils are equal, round, and reactive to light. Right eye exhibits no discharge. Left eye exhibits no discharge.  Neck: Normal range of motion. Neck supple. No JVD present. No tracheal deviation present. No thyromegaly present.  Cardiovascular: Normal rate, regular rhythm and normal heart sounds.  Exam reveals no gallop.   No murmur heard. Pulmonary/Chest: No stridor. No respiratory distress. She has no wheezes. She has no rales. She exhibits no tenderness.  Abdominal: Soft. Bowel sounds are normal. She exhibits no distension and no mass. There is no tenderness. There is no rebound and no guarding.  Musculoskeletal: She exhibits no edema and no tenderness.  Lymphadenopathy:    She has no cervical adenopathy.  Neurological: She displays normal reflexes. No cranial nerve deficit. She exhibits normal muscle tone. Coordination normal.  Skin: No rash noted. No erythema.  Psychiatric: She has a normal mood and affect. Her behavior is normal. Judgment and thought content normal.       Tearful, upset   Lab Results  Component Value Date   WBC 3.7* 03/21/2012   HGB 13.6 03/21/2012   HCT 38.6 03/21/2012   PLT 220 03/21/2012   GLUCOSE 89 03/21/2012   ALT 33 03/21/2012   AST 34 03/21/2012   NA 129* 03/21/2012  K 4.0 03/21/2012   CL 95* 03/21/2012   CREATININE 0.71 03/21/2012   BUN 5* 03/21/2012   CO2 24 03/21/2012   TSH 0.85 08/16/2011   PET        Assessment & Plan:

## 2012-06-04 NOTE — Assessment & Plan Note (Signed)
7/13 on PET scan She will discuss w/Dr Myna Hidalgo - 06/09/12

## 2012-06-04 NOTE — Assessment & Plan Note (Signed)
Continue with current prescription therapy as reflected on the Med list. Added Trazodone at hs 

## 2012-06-04 NOTE — Assessment & Plan Note (Signed)
Re-start (she stopped months ago)

## 2012-06-04 NOTE — Assessment & Plan Note (Addendum)
On Zelboraf - Dr Myna Hidalgo 06/03/12 PET is reviewed w/pt

## 2012-06-09 ENCOUNTER — Other Ambulatory Visit (HOSPITAL_BASED_OUTPATIENT_CLINIC_OR_DEPARTMENT_OTHER): Payer: BC Managed Care – PPO | Admitting: Lab

## 2012-06-09 ENCOUNTER — Ambulatory Visit (HOSPITAL_BASED_OUTPATIENT_CLINIC_OR_DEPARTMENT_OTHER): Payer: BC Managed Care – PPO | Admitting: Hematology & Oncology

## 2012-06-09 VITALS — BP 119/80 | HR 93 | Temp 98.6°F | Ht 65.0 in | Wt 139.0 lb

## 2012-06-09 DIAGNOSIS — C4359 Malignant melanoma of other part of trunk: Secondary | ICD-10-CM

## 2012-06-09 DIAGNOSIS — C439 Malignant melanoma of skin, unspecified: Secondary | ICD-10-CM

## 2012-06-09 DIAGNOSIS — R0789 Other chest pain: Secondary | ICD-10-CM

## 2012-06-09 LAB — COMPREHENSIVE METABOLIC PANEL
ALT: 18 U/L (ref 0–35)
Albumin: 4.4 g/dL (ref 3.5–5.2)
CO2: 28 mEq/L (ref 19–32)
Calcium: 10 mg/dL (ref 8.4–10.5)
Chloride: 95 mEq/L — ABNORMAL LOW (ref 96–112)
Glucose, Bld: 103 mg/dL — ABNORMAL HIGH (ref 70–99)
Potassium: 4.6 mEq/L (ref 3.5–5.3)
Sodium: 131 mEq/L — ABNORMAL LOW (ref 135–145)
Total Protein: 6.8 g/dL (ref 6.0–8.3)

## 2012-06-09 LAB — CBC WITH DIFFERENTIAL (CANCER CENTER ONLY)
Eosinophils Absolute: 0.2 10*3/uL (ref 0.0–0.5)
LYMPH#: 1.1 10*3/uL (ref 0.9–3.3)
LYMPH%: 15 % (ref 14.0–48.0)
MCV: 85 fL (ref 81–101)
MONO#: 0.8 10*3/uL (ref 0.1–0.9)
NEUT#: 5.3 10*3/uL (ref 1.5–6.5)
Platelets: 245 10*3/uL (ref 145–400)
RBC: 4.55 10*6/uL (ref 3.70–5.32)
WBC: 7.4 10*3/uL (ref 3.9–10.0)

## 2012-06-09 LAB — LACTATE DEHYDROGENASE: LDH: 154 U/L (ref 94–250)

## 2012-06-09 MED ORDER — OXYCODONE-ACETAMINOPHEN 5-325 MG PO TABS: 1.0000 | ORAL_TABLET | Freq: Four times a day (QID) | ORAL | Status: DC | PRN

## 2012-06-09 NOTE — Progress Notes (Signed)
This office note has been dictated.

## 2012-06-09 NOTE — Progress Notes (Signed)
CC:   Susan Quint. Plotnikov, MD Velora Heckler, MD [name], MD  DIAGNOSES: 1. Metastatic melanoma. 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  CURRENT THERAPY:  Zelboraf 960 mg p.o. b.i.d.  INTERIM HISTORY:  Susan Mccarthy comes in for followup.  She continues to do amazingly well.  She has not yet moved up to [place].  She is not sure when that will happen.  I suspect that they may take quite a while before that really does occur, if it even does.  She has done well with the Zelboraf.  Her skin has not looked too bad. She is watching out with being outside in the sun.  We did go ahead and repeat a PET scan on her.  The PET scan was done on 06/03/2012.  PET scan showed no evidence of metastatic disease within the neck/chest/abdomen or pelvis.  She does have the hypermetabolic right thyroid nodule.  She has had no bleeding.  There has been no problem with bowels or bladder.  She has had no leg swelling.  She has had no headache.  PHYSICAL EXAMINATION:  General:  This is a well-developed, well- nourished white female in no obvious distress.  Vital signs: Temperature 98.6, pulse 93, respiratory rate 20, blood pressure 119/80. Weight is 139.  Head and neck:  Normocephalic, atraumatic skull.  She has no ocular or oral lesions.  There is no scleral icterus.  She has no adenopathy in her neck.  I cannot palpate her thyroid nodule in the right.  Lungs:  Clear bilaterally.  She has no rales, wheeze or rhonchi. Cardiac:  Regular rate and rhythm with a normal S1, S2.  There are no murmurs, rubs or bruits.  Abdomen:  Soft with good bowel sounds.  There is no palpable abdominal mass.  There is no palpable hepatosplenomegaly. Back:  Shows the wide local excision scar in the right mid back.  This is well-healed.  She has no tenderness over the spine, ribs or hips. Extremities:  Show no clubbing, cyanosis or edema.  Skin:  Exam does show some slightly dry skin.  She may have some  maculopapular lesions on her face and back.  LABORATORIES STUDIES:  White cell count is 7.4, hemoglobin 13.5, hematocrit 38.7, platelet count 245.  IMPRESSION:  Susan Mccarthy is a 61 year old white female with metastatic melanoma.  She is doing incredibly well.  She basically has been in remission with the Zelboraf.  I did speak with Dr. Gerrit Friends of [facility].  He will see Susan Mccarthy to see about a right thyroid lobectomy for this Hurthle cell tumor.  I do not think we need to do another scan on Susan Mccarthy probably for about another 4 months.  We will go ahead and get her back in 2 months for followup.  I am very happy to see that Susan Mccarthy has done so well.  Mentally she has really adjusted nicely.  Her situation at home is not as bad as it once was.    ______________________________ [name], M.D. PRE/MEDQ  D:  06/09/2012  T:  06/09/2012  Job:  [id number]

## 2012-06-17 ENCOUNTER — Encounter (INDEPENDENT_AMBULATORY_CARE_PROVIDER_SITE_OTHER): Payer: Self-pay | Admitting: Surgery

## 2012-06-17 ENCOUNTER — Ambulatory Visit (INDEPENDENT_AMBULATORY_CARE_PROVIDER_SITE_OTHER): Payer: BC Managed Care – PPO | Admitting: Surgery

## 2012-06-17 VITALS — BP 116/84 | HR 85 | Temp 98.2°F | Ht 65.0 in | Wt 139.0 lb

## 2012-06-17 DIAGNOSIS — E041 Nontoxic single thyroid nodule: Secondary | ICD-10-CM

## 2012-06-17 DIAGNOSIS — D440 Neoplasm of uncertain behavior of thyroid gland: Secondary | ICD-10-CM

## 2012-06-17 DIAGNOSIS — D449 Neoplasm of uncertain behavior of unspecified endocrine gland: Secondary | ICD-10-CM

## 2012-06-17 NOTE — Patient Instructions (Signed)
Thyroid Diseases Your thyroid is a butterfly-shaped gland in your neck. It is located just above your collarbone. It is one of your endocrine glands, which make hormones. The thyroid helps set your metabolism. Metabolism is how your body gets energy from the foods you eat.  Millions of people have thyroid diseases. Women experience thyroid problems more often than men. In fact, overactive thyroid problems (hyperthyroidism) occur in 1% of all women. If you have a thyroid disease, your body may use energy more slowly or quickly than it should.  Thyroid problems also include an immune disease where your body reacts against your thyroid gland (called thyroiditis). A different problem involves lumps and bumps (called nodules) that develop in the gland. The nodules are usually, but not always, noncancerous. THE MOST COMMON THYROID PROBLEMS AND CAUSES ARE DISCUSSED BELOW There are many causes for thyroid problems. Treatment depends upon the exact diagnosis and includes trying to reset your body's metabolism to a normal rate. Hyperthyroidism Too much thyroid hormone from an overactive thyroid gland is called hyperthyroidism. In hyperthyroidism, the body's metabolism speeds up. One of the most frequent forms of hyperthyroidism is known as Graves' disease. Graves' disease tends to run in families. Although Graves' is thought to be caused by a problem with the immune system, the exact nature of the genetic problem is unknown. Hypothyroidism Too little thyroid hormone from an underactive thyroid gland is called hypothyroidism. In hypothyroidism, the body's metabolism is slowed. Several things can cause this condition. Most causes affect the thyroid gland directly and hurt its ability to make enough hormone.  Rarely, there may be a pituitary gland tumor (located near the base of the brain). The tumor can block the pituitary from producing thyroid-stimulating hormone (TSH). Your body makes TSH to stimulate the thyroid  to work properly. If the pituitary does not make enough TSH, the thyroid fails to make enough hormones needed for good health. Whether the problem is caused by thyroid conditions or by the pituitary gland, the result is that the thyroid is not making enough hormones. Hypothyroidism causes many physical and mental processes to become sluggish. The body consumes less oxygen and produces less body heat. Thyroid Nodules A thyroid nodule is a small swelling or lump in the thyroid gland. They are common. These nodules represent either a growth of thyroid tissue or a fluid-filled cyst. Both form a lump in the thyroid gland. Almost half of all people will have tiny thyroid nodules at some point in their lives. Typically, these are not noticeable until they become large and affect normal thyroid size. Larger nodules that are greater than a half inch across (about 1 centimeter) occur in about 5 percent of people. Although most nodules are not cancerous, people who have them should seek medical care to rule out cancer. Also, some thyroid nodules may produce too much thyroid hormone or become too large. Large nodules or a large gland can interfere with breathing or swallowing or may cause neck discomfort. Other problems Other thyroid problems include cancer and thyroiditis. Thyroiditis is a malfunction of the body's immune system. Normally, the immune system works to defend the body against infection and other problems. When the immune system is not working properly, it may mistakenly attack normal cells, tissues, and organs. Examples of autoimmune diseases are Hashimoto's thyroiditis (which causes low thyroid function) and Graves' disease (which causes excess thyroid function). SYMPTOMS  Symptoms vary greatly depending upon the exact type of problem with the thyroid. Hyperthyroidism-is when your thyroid is too   active and makes more thyroid hormone than your body needs. The most common cause is Graves' Disease. Too  much thyroid hormone can cause some or all of the following symptoms:  Anxiety.   Irritability.   Difficulty sleeping.   Fatigue.   A rapid or irregular heartbeat.   A fine tremor of your hands or fingers.   An increase in perspiration.   Sensitivity to heat.   Weight loss, despite normal food intake.   Brittle hair.   Enlargement of your thyroid gland (goiter).   Light menstrual periods.   Frequent bowel movements.  Graves' disease can specifically cause eye and skin problems. The skin problems involve reddening and swelling of the skin, often on your shins and on the top of your feet. Eye problems can include the following:  Excess tearing and sensation of grit or sand in either or both eyes.   Reddened or inflamed eyes.   Widening of the space between your eyelids.   Swelling of the lids and tissues around the eyes.   Light sensitivity.   Ulcers on the cornea.   Double vision.   Limited eye movements.   Blurred or reduced vision.  Hypothyroidism- is when your thyroid gland is not active enough. This is more common than hyperthyroidism. Symptoms can vary a lot depending of the severity of the hormone deficiency. Symptoms may develop over a long period of time and can include several of the following:  Fatigue.   Sluggishness.   Increased sensitivity to cold.   Constipation.   Pale, dry skin.   A puffy face.   Hoarse voice.   High blood cholesterol level.   Unexplained weight gain.   Muscle aches, tenderness and stiffness.   Pain, stiffness or swelling in your joints.   Muscle weakness.   Heavier than normal menstrual periods.   Brittle fingernails and hair.   Depression.  Thyroid Nodules - most do not cause signs or symptoms. Occasionally, some may become so large that you can feel or even see the swelling at the base of your neck. You may realize a lump or swelling is there when you are shaving or putting on makeup. Men might become  aware of a nodule when shirt collars suddenly feel too tight. Some nodules produce too much thyroid hormone. This can produce the same symptoms as hyperthyroidism (see above). Thyroid nodules are seldom cancerous. However, a nodule is more likely to be malignant (cancerous) if it:  Grows quickly or feels hard.   Causes you to become hoarse or to have trouble swallowing or breathing.   Causes enlarged lymph nodes under your jaw or in your neck.  DIAGNOSIS  Because there are so many possible thyroid conditions, your caregiver may ask for a number of tests. They will do this in order to narrow down the exact diagnosis. These tests can include:  Blood and antibody tests.   Special thyroid scans using small, safe amounts of radioactive iodine.   Ultrasound of the thyroid gland (particularly if there is a nodule or lump).   Biopsy. This is usually done with a special needle. A needle biopsy is a procedure to obtain a sample of cells from the thyroid. The tissue will be tested in a lab and examined under a microscope.  TREATMENT  Treatment depends on the exact diagnosis. Hyperthyroidism  Beta-blockers help relieve many of the symptoms.   Anti-thyroid medications prevent the thyroid from making excess hormones.   Radioactive iodine treatment can destroy overactive thyroid   cells. The iodine can permanently decrease the amount of hormone produced.   Surgery to remove the thyroid gland.   Treatments for eye problems that come from Graves' disease also include medications and special eye surgery, if felt to be appropriate.  Hypothyroidism Thyroid replacement with levothyroxine is the mainstay of treatment. Treatment with thyroid replacement is usually lifelong and will require monitoring and adjustment from time to time. Thyroid Nodules  Watchful waiting. If a small nodule causes no symptoms or signs of cancer on biopsy, then no treatment may be chosen at first. Re-exam and re-checking blood  tests would be the recommended follow-up.   Anti-thyroid medications or radioactive iodine treatment may be recommended if the nodules produce too much thyroid hormone (see Treatment for Hyperthyroidism above).   Alcohol ablation. Injections of small amounts of ethyl alcohol (ethanol) can cause a non-cancerous nodule to shrink in size.   Surgery (see Treatment for Hyperthyroidism above).  HOME CARE INSTRUCTIONS   Take medications as instructed.   Follow through on recommended testing.  SEEK MEDICAL CARE IF:   You feel that you are developing symptoms of Hyperthyroidism or Hypothyroidism as described above.   You develop a new lump/nodule in the neck/thyroid area that you had not noticed before.   You feel that you are having side effects from medicines prescribed.   You develop trouble breathing or swallowing.  SEEK IMMEDIATE MEDICAL CARE IF:   You develop a fever of 102 F (38.9 C) or higher.   You develop severe sweating.   You develop palpitations and/or rapid heart beat.   You develop shortness of breath.   You develop nausea and vomiting.   You develop extreme shakiness.   You develop agitation.   You develop lightheadedness or have a fainting episode.  Document Released: 09/16/2007 Document Revised: 11/08/2011 Document Reviewed: 09/16/2007 Exit[employer] Patient Information 2012 [employer]. 

## 2012-06-17 NOTE — Progress Notes (Signed)
General Surgery [facility]  Chief Complaint  Patient presents with  . Thyroid Nodule    Hurthle cell neoplasm, right thyroid lobe - referral from Dr. [name]    HISTORY: Patient is a 61 year old white female referred by her medical oncologist with right thyroid nodule. Patient has undergone treatment for metastatic malignant melanoma. Part of her workup has been performed at [facility]. This apparently included a thyroid ultrasound and ultrasound-guided fine-needle aspiration biopsy. According to the patient this was performed in 2012. Biopsy showed Hurthle cell neoplasm. Patient was told that she had a 15% chance of malignancy.   Patient had a recent nuclear medicine PET scan which shows no evidence of residual melanoma or metastatic disease. However there is a low attenuation lesion in the right lobe of the thyroid which is hypermetabolic. Ultrasound was recommended. Patient is referred at this time for consideration for thyroid lobectomy or total thyroidectomy for management.  Patient has no prior history of thyroid disease. She has never been on thyroid medication. She has had no prior head or neck surgery. There is a family history of goiter in the patient's sister.  Past Medical History  Diagnosis Date  . Melanoma 1980    RIGHT BACK  . History of chemotherapy     INTERFERON  . Anxiety   . Depression   . Thyroid disease     85 % BENIGN  . Insomnia   . Arthritis      Current Outpatient Prescriptions  Medication Sig Dispense Refill  . amphetamine-dextroamphetamine (ADDERALL, 10MG ,) 10 MG tablet Take 1 tablet (10 mg total) by mouth daily.  30 tablet  0  . cetirizine (ZYRTEC) 5 MG tablet Take 5 mg by mouth as needed.       . cholecalciferol (VITAMIN D) 1000 UNITS tablet Take 1 tablet (1,000 Units total) by mouth daily.  30 tablet  11  . clonazePAM (KLONOPIN) 1 MG tablet Take 1 tablet (1 mg total) by mouth 3 (three) times daily as  needed for anxiety.  90 tablet  1  . cyanocobalamin (COBAL-1000) 1000 MCG/ML injection 1000 mcg sq q 1 mo  10 mL  11  . diclofenac sodium (VOLTAREN) 1 % GEL Apply 1 application topically 4 (four) times daily.  100 g  3  . diphenhydrAMINE (BENADRYL) 25 MG tablet Take 25 mg by mouth as needed.        Marland Kitchen FLUoxetine (PROZAC) 20 MG capsule TAKE ONE CAPSULE BY MOUTH EVERY DAY  30 capsule  5  . oxyCODONE-acetaminophen (PERCOCET) 5-325 MG per tablet Take 1 tablet by mouth every 6 (six) hours as needed for pain.  60 tablet  0  . predniSONE (DELTASONE) 20 MG tablet 10 mg daily.       . promethazine (PHENERGAN) 12.5 MG tablet Take 12.5 mg by mouth every 6 (six) hours as needed.      . silver sulfADIAZINE (SILVADENE) 1 % cream       . traZODone (DESYREL) 50 MG tablet 1-2 at night for sleep  60 tablet  5  . vemurafenib (ZELBORAF) 240 MG tablet Take 4 tablets (960 mg total) by mouth every 12 (twelve) hours. Take with water.  240 tablet  2   No current facility-administered medications for this visit.   Facility-Administered Medications Ordered in Other Visits  Medication Dose Route Frequency Provider Last Rate Last Dose  . LORazepam (ATIVAN) injection 0.5 mg  0.5 mg Intravenous Once [name], MD  No Known Allergies   Family History  Problem Relation Age of Onset  . ALS Father      History   Social History  . Marital Status: Married    Spouse Name: N/A    Number of Children: N/A  . Years of Education: N/A   Social History Main Topics  . Smoking status: Former Smoker    Types: Cigarettes    Quit date: 06/07/1979  . Smokeless tobacco: None  . Alcohol Use: Yes     RARE OCCASIONAL   . Drug Use: No  . Sexually Active: Not Currently   Other Topics Concern  . None   Social History Narrative   MarriedStressful marriage     REVIEW OF SYSTEMS - PERTINENT POSITIVES ONLY: Denies compressive symptoms. Denies dysphagia. Denies tremors. Denies palpitations.  EXAM: Filed  Vitals:   06/17/12 0943  BP: 116/84  Pulse: 85  Temp: 98.2 F (36.8 C)    HEENT: normocephalic; pupils equal and reactive; sclerae clear; dentition good; mucous membranes moist NECK:  Dominant nodule in the mid right thyroid lobe measuring approximately 2 cm in size; mobile, nontender; asymmetric on extension; no palpable anterior or posterior cervical lymphadenopathy; no supraclavicular masses; no tenderness CHEST: clear to auscultation bilaterally without rales, rhonchi, or wheezes CARDIAC: regular rate and rhythm without significant murmur; peripheral pulses are full ABDOMEN: soft without distension; bowel sounds present; no mass; no hepatosplenomegaly; no hernia EXT:  non-tender without edema; no deformity NEURO: no gross focal deficits; no sign of tremor   LABORATORY RESULTS: See [facility] (CHL-Epic) for most recent results   RADIOLOGY RESULTS: See [facility] (CHL-Epic) for most recent results   IMPRESSION: Dominant right thyroid nodule, approximately 2 cm, with history of fine-needle aspiration biopsy showing Hurthle cell neoplasm  PLAN: The patient and I discussed the above findings at length. We reviewed her PET scan. I have recommended that she proceed with thyroid ultrasound. If this is a solitary nodule on the right side, then I think she will require a right thyroid lobectomy for definitive diagnosis. If the patient has bilateral thyroid nodules, or if the nodule proves to be malignant, then she will require total thyroidectomy.  Patient and I discussed the procedure of thyroid lobectomy and total thyroidectomy at length. We discussed the location of the surgical incision in the hospital stay to be anticipated. We discussed potential complications including recurrent nerve injury and injury to parathyroid glands. We discussed her postoperative recovery and potential need for radioactive iodine treatment. She understands and wishes to proceed.  We will obtain a  thyroid ultrasound in the near future. I will contact her with those results. We will then make a final decision regarding right thyroid lobectomy versus total thyroidectomy at that time.  The risks and benefits of the procedure have been discussed at length with the patient.  The patient understands the proposed procedure, potential alternative treatments, and the course of recovery to be expected.  All of the patient's questions have been answered at this time.  The patient wishes to proceed with surgery.  Velora Heckler, MD, FACS General & Endocrine Surgery [facility]   Visit Diagnoses: 1. Thyroid nodule   2. Neoplasm of uncertain behavior of thyroid gland     Primary Care Physician: Sonda Primes, MD

## 2012-06-18 ENCOUNTER — Other Ambulatory Visit: Payer: Self-pay | Admitting: *Deleted

## 2012-06-18 DIAGNOSIS — F909 Attention-deficit hyperactivity disorder, unspecified type: Secondary | ICD-10-CM

## 2012-06-18 MED ORDER — AMPHETAMINE-DEXTROAMPHETAMINE 10 MG PO TABS: 10.0000 mg | ORAL_TABLET | Freq: Every day | ORAL | Status: DC

## 2012-06-19 ENCOUNTER — Telehealth (INDEPENDENT_AMBULATORY_CARE_PROVIDER_SITE_OTHER): Payer: Self-pay

## 2012-06-19 ENCOUNTER — Ambulatory Visit
Admission: RE | Admit: 2012-06-19 | Discharge: 2012-06-19 | Disposition: A | Discharge status: Home / Self Care | Payer: BC Managed Care – PPO | Source: Ambulatory Visit | Attending: Surgery | Admitting: Surgery

## 2012-06-19 NOTE — Telephone Encounter (Signed)
Pt advised u/s will go to Dr Gerrit Friends to review and advise [id number]. Pt advised will be next week before Dr Gerrit Friends is back to review this study.

## 2012-06-26 ENCOUNTER — Telehealth (INDEPENDENT_AMBULATORY_CARE_PROVIDER_SITE_OTHER): Payer: Self-pay | Admitting: Surgery

## 2012-06-26 DIAGNOSIS — D440 Neoplasm of uncertain behavior of thyroid gland: Secondary | ICD-10-CM

## 2012-06-26 DIAGNOSIS — E041 Nontoxic single thyroid nodule: Secondary | ICD-10-CM

## 2012-06-26 NOTE — Telephone Encounter (Signed)
Ultrasound shows nodular disease in right thyroid lobe only.  Will recommend right thyroid lobectomy. Velora Heckler, MD, [facility] Office: [phone]

## 2012-06-27 ENCOUNTER — Telehealth (INDEPENDENT_AMBULATORY_CARE_PROVIDER_SITE_OTHER): Payer: Self-pay | Admitting: Surgery

## 2012-06-27 ENCOUNTER — Other Ambulatory Visit (INDEPENDENT_AMBULATORY_CARE_PROVIDER_SITE_OTHER): Payer: Self-pay | Admitting: Surgery

## 2012-06-27 DIAGNOSIS — D440 Neoplasm of uncertain behavior of thyroid gland: Secondary | ICD-10-CM

## 2012-06-27 DIAGNOSIS — E041 Nontoxic single thyroid nodule: Secondary | ICD-10-CM

## 2012-06-27 NOTE — Telephone Encounter (Signed)
Discussed ultrasound results.  Recommended right thyroid lobectomy, and in the event of malignancy, completion thyroidectomy.  The risks and benefits of the procedure have been discussed at length with the patient.  The patient understands the proposed procedure, potential alternative treatments, and the course of recovery to be expected.  All of the patient's questions have been answered at this time.  The patient wishes to proceed with surgery.  Velora Heckler, MD, [facility] Office: [phone]

## 2012-07-01 ENCOUNTER — Telehealth (INDEPENDENT_AMBULATORY_CARE_PROVIDER_SITE_OTHER): Payer: Self-pay

## 2012-07-01 NOTE — Telephone Encounter (Signed)
Surgery orders to surgery schedulers per Dr Gerrit Friends.

## 2012-07-10 ENCOUNTER — Telehealth: Payer: Self-pay | Admitting: *Deleted

## 2012-07-10 NOTE — Telephone Encounter (Signed)
Rf req for Clonazepam 1 mg 1 po tid prn. Last filled 06/11/12. Ok to Rf?

## 2012-07-10 NOTE — Telephone Encounter (Signed)
OK to fill this prescription with additional refills x2 Thank you!  

## 2012-07-11 ENCOUNTER — Other Ambulatory Visit: Payer: Self-pay | Admitting: *Deleted

## 2012-07-11 ENCOUNTER — Encounter (HOSPITAL_COMMUNITY): Payer: Self-pay | Admitting: Pharmacy Technician

## 2012-07-11 DIAGNOSIS — C439 Malignant melanoma of skin, unspecified: Secondary | ICD-10-CM

## 2012-07-11 MED ORDER — CLONAZEPAM 1 MG PO TABS: 1.0000 mg | ORAL_TABLET | Freq: Three times a day (TID) | ORAL | Status: DC | PRN

## 2012-07-11 MED ORDER — VEMURAFENIB 240 MG PO TABS: 960.0000 mg | ORAL_TABLET | Freq: Two times a day (BID) | ORAL | Status: DC

## 2012-07-11 NOTE — Telephone Encounter (Signed)
Done

## 2012-07-15 ENCOUNTER — Other Ambulatory Visit: Payer: Self-pay | Admitting: *Deleted

## 2012-07-15 DIAGNOSIS — C439 Malignant melanoma of skin, unspecified: Secondary | ICD-10-CM

## 2012-07-15 DIAGNOSIS — R0789 Other chest pain: Secondary | ICD-10-CM

## 2012-07-15 DIAGNOSIS — C4359 Malignant melanoma of other part of trunk: Secondary | ICD-10-CM

## 2012-07-15 MED ORDER — OXYCODONE-ACETAMINOPHEN 5-325 MG PO TABS: 1.0000 | ORAL_TABLET | Freq: Four times a day (QID) | ORAL | Status: DC | PRN

## 2012-07-15 NOTE — Telephone Encounter (Signed)
Pt left message on voicemail. Wants to come by tomorrow to pick up a rx for Percocet. She said she will have someone available to drive her around.She is due to see Dr Myna Hidalgo on 08/11/12.

## 2012-07-16 ENCOUNTER — Ambulatory Visit (INDEPENDENT_AMBULATORY_CARE_PROVIDER_SITE_OTHER): Payer: BC Managed Care – PPO | Admitting: Surgery

## 2012-07-18 ENCOUNTER — Encounter (HOSPITAL_COMMUNITY)
Admission: RE | Admit: 2012-07-18 | Discharge: 2012-07-18 | Disposition: A | Discharge status: Home / Self Care | Payer: BC Managed Care – PPO | Source: Ambulatory Visit | Attending: Surgery | Admitting: Surgery

## 2012-07-18 NOTE — Pre-Procedure Instructions (Signed)
PT CAME FOR PREOP VISIT-READ CONSENT FOR SURGERY-WISHES TO TALK TO DR. GERKIN BEFORE SIGNING CONSENT -DOES NOT WISH TO PROCEED WITH PREOP AT THIS TIME.  DR. Ardine Eng OFFICE NOTIFIED PT REQUESTED DR. GERKIN TO CALL HER Monday AT [phone].  PT WILL CALL [facility] NEXT WEEK TO RESCHEDULE PREOP APPOINTMENT IF SHE DECIDES TO HAVE HER SURGERY.  NO PREOP LABS WERE DONE TODAY.

## 2012-07-22 ENCOUNTER — Telehealth (INDEPENDENT_AMBULATORY_CARE_PROVIDER_SITE_OTHER): Payer: Self-pay | Admitting: Surgery

## 2012-07-22 DIAGNOSIS — D440 Neoplasm of uncertain behavior of thyroid gland: Secondary | ICD-10-CM

## 2012-07-22 DIAGNOSIS — E041 Nontoxic single thyroid nodule: Secondary | ICD-10-CM

## 2012-07-22 NOTE — Telephone Encounter (Signed)
General Surgery [facility]  I received a message via Epic that this patient wished to review some items in the informed consent form regarding her upcoming thyroid surgery.  I called patient today to discuss any remaining issues.  She stated that she had been seen in the pre-op clinic at [facility] on Friday, August 16th.  See expected to be seen by me in the office on Monday, August 19th.  As I was working in the hospital all day yesterday as hospitalist, I was not seeing any patients in the office.  I called the patient today to further discuss her case and answer any remaining questions.  The patient informed me that she had decided to cancel her surgery and schedule consultation at [facility] in September.  At her request, we will send records to [facility] when she has properly signed a release form at the office.  I have notified my staff to cancel the scheduled surgery for thyroid lobectomy.  I will not see the patient back in my practice.  She will obtain her future surgical care at [facility] per her request.  Velora Heckler, MD, FACS General & Endocrine Surgery [facility] Office: [phone]

## 2012-07-24 ENCOUNTER — Ambulatory Visit (HOSPITAL_COMMUNITY): Admission: RE | Admit: 2012-07-24 | Payer: BC Managed Care – PPO | Source: Ambulatory Visit | Admitting: Surgery

## 2012-07-24 ENCOUNTER — Encounter (HOSPITAL_COMMUNITY): Admission: RE | Payer: Self-pay | Source: Ambulatory Visit

## 2012-07-24 SURGERY — LOBECTOMY, THYROID
Anesthesia: General | Laterality: Right

## 2012-07-31 ENCOUNTER — Telehealth: Payer: Self-pay | Admitting: Hematology & Oncology

## 2012-07-31 NOTE — Telephone Encounter (Signed)
Received call from pt wanting [place] to send records to [facility]. Esmeralda Links for an appointment on 08-08-12. I called MD office spoke with RN Dion Saucier. [phone] fax [phone]. Will send records for continuity of care. Pt aware I spoke with them.

## 2012-08-06 ENCOUNTER — Encounter (INDEPENDENT_AMBULATORY_CARE_PROVIDER_SITE_OTHER): Payer: BC Managed Care – PPO | Admitting: Surgery

## 2012-08-11 ENCOUNTER — Ambulatory Visit: Payer: BC Managed Care – PPO | Admitting: Hematology & Oncology

## 2012-08-11 ENCOUNTER — Other Ambulatory Visit: Payer: BC Managed Care – PPO | Admitting: Lab

## 2012-08-11 ENCOUNTER — Telehealth: Payer: Self-pay | Admitting: Hematology & Oncology

## 2012-08-11 NOTE — Telephone Encounter (Signed)
Left pt message to call and reschedule appointment °

## 2012-08-14 ENCOUNTER — Telehealth: Payer: Self-pay | Admitting: Hematology & Oncology

## 2012-08-14 NOTE — Telephone Encounter (Signed)
Pt called rescheduled no show to 9-16 and she left a message on Nurse line needing a proscription RN aware

## 2012-08-18 ENCOUNTER — Ambulatory Visit (HOSPITAL_BASED_OUTPATIENT_CLINIC_OR_DEPARTMENT_OTHER): Payer: BC Managed Care – PPO | Admitting: Hematology & Oncology

## 2012-08-18 ENCOUNTER — Ambulatory Visit (HOSPITAL_BASED_OUTPATIENT_CLINIC_OR_DEPARTMENT_OTHER): Payer: BC Managed Care – PPO | Admitting: Lab

## 2012-08-18 VITALS — BP 115/72 | HR 90 | Temp 97.4°F | Resp 18 | Ht 65.0 in | Wt 137.0 lb

## 2012-08-18 DIAGNOSIS — C439 Malignant melanoma of skin, unspecified: Secondary | ICD-10-CM

## 2012-08-18 DIAGNOSIS — C773 Secondary and unspecified malignant neoplasm of axilla and upper limb lymph nodes: Secondary | ICD-10-CM

## 2012-08-18 DIAGNOSIS — C7800 Secondary malignant neoplasm of unspecified lung: Secondary | ICD-10-CM

## 2012-08-18 DIAGNOSIS — F909 Attention-deficit hyperactivity disorder, unspecified type: Secondary | ICD-10-CM

## 2012-08-18 DIAGNOSIS — R0789 Other chest pain: Secondary | ICD-10-CM

## 2012-08-18 DIAGNOSIS — C4359 Malignant melanoma of other part of trunk: Secondary | ICD-10-CM

## 2012-08-18 LAB — COMPREHENSIVE METABOLIC PANEL
ALT: 14 U/L (ref 0–35)
AST: 18 U/L (ref 0–37)
CO2: 27 mEq/L (ref 19–32)
Calcium: 10.5 mg/dL (ref 8.4–10.5)
Chloride: 97 mEq/L (ref 96–112)
Creatinine, Ser: 0.91 mg/dL (ref 0.50–1.10)
Sodium: 132 mEq/L — ABNORMAL LOW (ref 135–145)
Total Bilirubin: 0.4 mg/dL (ref 0.3–1.2)
Total Protein: 6.8 g/dL (ref 6.0–8.3)

## 2012-08-18 LAB — CBC WITH DIFFERENTIAL (CANCER CENTER ONLY)
BASO%: 0.2 % (ref 0.0–2.0)
EOS%: 1.8 % (ref 0.0–7.0)
LYMPH#: 0.8 10*3/uL — ABNORMAL LOW (ref 0.9–3.3)
MCHC: 34.6 g/dL (ref 32.0–36.0)
MONO#: 0.5 10*3/uL (ref 0.1–0.9)
Platelets: 196 10*3/uL (ref 145–400)
RDW: 14 % (ref 11.1–15.7)
WBC: 4.4 10*3/uL (ref 3.9–10.0)

## 2012-08-18 LAB — LACTATE DEHYDROGENASE: LDH: 161 U/L (ref 94–250)

## 2012-08-18 MED ORDER — OXYCODONE-ACETAMINOPHEN 5-325 MG PO TABS: 1.0000 | ORAL_TABLET | Freq: Four times a day (QID) | ORAL | Status: DC | PRN

## 2012-08-18 MED ORDER — AMPHETAMINE-DEXTROAMPHETAMINE 10 MG PO TABS: 10.0000 mg | ORAL_TABLET | Freq: Every day | ORAL | Status: DC

## 2012-08-18 NOTE — Progress Notes (Signed)
This office note has been dictated.

## 2012-08-19 NOTE — Progress Notes (Signed)
CC:   Susan Quint. Plotnikov, MD [name], MD  DIAGNOSES: 1. Metastatic melanoma. 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  CURRENT THERAPY:  Zelboraf 960 mg p.o. b.i.d.  INTERIM HISTORY:  Susan Mccarthy comes in for followup.  She actually saw a surgeon out at [facility] for the thyroid issue.  He felt that there is no indication for surgery.  He just wants to see her back in a year from what Susan Mccarthy says and do another thyroid ultrasound.  Susan Mccarthy is quite satisfied with this.  She was very reluctant to have surgery in the first place.  I felt that surgery probably would be necessary, but again this Susan Mccarthy tends to do as she feels.  Susan Mccarthy otherwise has been doing okay.  She is doing well on the Zelboraf.  Her last PET scan was done in July.  We need to set this up another one in October.  She is having no cough.  She is having no headache.  There is no difficulty swallowing.  She is having no nausea or vomiting.  He has had no fever, sweats or chills.  She has not noticed any unusual skin lesions.  PHYSICAL EXAMINATION:  This is a well-developed, well-nourished white female in no obvious distress.  Vital signs:  Temperature of 97.4, pulse 90, respiratory rate 18, blood pressure 115/72.  Weight is 137.  Head and neck:  Normocephalic, atraumatic skull.  There are no ocular or oral lesions.  There are no palpable cervical or supraclavicular lymph nodes. I cannot palpate a thyroid.  Lungs:  Clear bilaterally.  Cardiac: Regular rate and rhythm with a normal S1 and S2.  There are no murmurs, rubs or bruits.  Abdomen:  Soft with good bowel sounds.  There is no palpable abdominal mass.  There is no palpable hepatosplenomegaly. Back:  No tenderness over the spine, ribs, or hips.  She has a well- healed wide local excision scar on her right mid back.  Axillary exam" no bilateral axillary adenopathy.  Extremities:  No clubbing, cyanosis or edema.  Skin:  Some dry skin.  I do  not see any suspicious skin lesions.  Neurologic:  No focal neurological deficits.  LABORATORY STUDIES:  White cell count is 4.4, hemoglobin 13.2, hematocrit 38.1, platelet count 196.  IMPRESSION:  Susan Mccarthy is a 61 year old white female with metastatic melanoma.  She is BRAF mutation-positive.  We will go ahead with a followup PET scan on her in October.  Again, this will give [place] a good idea as to how well she is doing.  She still plans to move up to [place]..  She has a lot of "loose ends" that need to get a straightened out first.    ______________________________ [name], M.D. PRE/MEDQ  D:  08/18/2012  T:  08/19/2012  Job:  [id number]

## 2012-08-27 ENCOUNTER — Telehealth: Payer: Self-pay | Admitting: Hematology & Oncology

## 2012-08-27 NOTE — Telephone Encounter (Signed)
Pt cx 10-14 PET and 11-7 MD said she is changing MD's to Dr. Lattie Corns. She spoke with Wille Glaser on sending records.

## 2012-09-04 ENCOUNTER — Ambulatory Visit: Payer: BC Managed Care – PPO | Admitting: Internal Medicine

## 2012-09-15 ENCOUNTER — Telehealth: Payer: Self-pay | Admitting: Hematology & Oncology

## 2012-09-15 ENCOUNTER — Other Ambulatory Visit (HOSPITAL_COMMUNITY): Payer: BC Managed Care – PPO

## 2012-09-15 NOTE — Telephone Encounter (Signed)
Pt called spoke with Dr. Myna Hidalgo

## 2012-09-19 ENCOUNTER — Other Ambulatory Visit: Payer: Self-pay | Admitting: Hematology & Oncology

## 2012-09-19 ENCOUNTER — Telehealth: Payer: Self-pay | Admitting: Hematology & Oncology

## 2012-09-19 DIAGNOSIS — R0789 Other chest pain: Secondary | ICD-10-CM

## 2012-09-19 DIAGNOSIS — F909 Attention-deficit hyperactivity disorder, unspecified type: Secondary | ICD-10-CM

## 2012-09-19 DIAGNOSIS — C4359 Malignant melanoma of other part of trunk: Secondary | ICD-10-CM

## 2012-09-19 DIAGNOSIS — C439 Malignant melanoma of skin, unspecified: Secondary | ICD-10-CM

## 2012-09-19 MED ORDER — AMPHETAMINE-DEXTROAMPHETAMINE 10 MG PO TABS: 10.0000 mg | ORAL_TABLET | Freq: Every day | ORAL | Status: DC

## 2012-09-19 MED ORDER — PROMETHAZINE HCL 12.5 MG PO TABS: 12.5000 mg | ORAL_TABLET | Freq: Four times a day (QID) | ORAL | Status: DC | PRN

## 2012-09-19 MED ORDER — CLINDAMYCIN PHOSPHATE 1 % EX LOTN: TOPICAL_LOTION | Freq: Two times a day (BID) | CUTANEOUS | Status: DC

## 2012-09-19 MED ORDER — NEOMYCIN-POLYMYXIN-DEXAMETH 0.1 % OP SUSP: 1.0000 [drp] | Freq: Three times a day (TID) | OPHTHALMIC | Status: DC

## 2012-09-19 MED ORDER — OXYCODONE-ACETAMINOPHEN 5-325 MG PO TABS: 1.0000 | ORAL_TABLET | Freq: Four times a day (QID) | ORAL | Status: DC | PRN

## 2012-09-19 NOTE — Telephone Encounter (Signed)
I left a message for Mrs. Baiza. I had spoken to her a couple days ago. There was some confusion regarding her appointments. She called [place] on September 25 wanting to cancel her appointments with [place] in her PET scan and then be followed exclusively at [facility] by Dr. Lattie Corns. In talking to her, she seemed to think that Dr. Lattie Corns do not want to see her.  I spoke with Dr. Lattie Corns yesterday. He said that he had called Ms.Brobeck and want her to have her PET scan done first and then he would see her. He still is happy to see her.  I just wanted to make sure that Ms.Verstraete knew that [facility] would see her. Dr. Lattie Corns just wanted to her to call him.

## 2012-09-22 ENCOUNTER — Other Ambulatory Visit: Payer: Self-pay | Admitting: Internal Medicine

## 2012-09-24 ENCOUNTER — Encounter (HOSPITAL_COMMUNITY)
Admission: RE | Admit: 2012-09-24 | Discharge: 2012-09-24 | Disposition: A | Discharge status: Home / Self Care | Payer: BC Managed Care – PPO | Source: Ambulatory Visit | Attending: Hematology & Oncology | Admitting: Hematology & Oncology

## 2012-09-24 ENCOUNTER — Other Ambulatory Visit (HOSPITAL_BASED_OUTPATIENT_CLINIC_OR_DEPARTMENT_OTHER): Payer: BC Managed Care – PPO | Admitting: Lab

## 2012-09-24 ENCOUNTER — Other Ambulatory Visit: Payer: Self-pay | Admitting: Medical

## 2012-09-24 DIAGNOSIS — C4359 Malignant melanoma of other part of trunk: Secondary | ICD-10-CM

## 2012-09-24 DIAGNOSIS — E041 Nontoxic single thyroid nodule: Secondary | ICD-10-CM | POA: Insufficient documentation

## 2012-09-24 DIAGNOSIS — C7800 Secondary malignant neoplasm of unspecified lung: Secondary | ICD-10-CM

## 2012-09-24 LAB — CBC WITH DIFFERENTIAL (CANCER CENTER ONLY)
BASO%: 0.9 % (ref 0.0–2.0)
HCT: 39.2 % (ref 34.8–46.6)
LYMPH%: 24 % (ref 14.0–48.0)
MCH: 28.4 pg (ref 26.0–34.0)
MCHC: 34.4 g/dL (ref 32.0–36.0)
MCV: 82 fL (ref 81–101)
MONO#: 0.5 10*3/uL (ref 0.1–0.9)
MONO%: 10.8 % (ref 0.0–13.0)
NEUT%: 59.7 % (ref 39.6–80.0)
Platelets: 258 10*3/uL (ref 145–400)
RDW: 13.8 % (ref 11.1–15.7)
WBC: 4.3 10*3/uL (ref 3.9–10.0)

## 2012-09-24 MED ORDER — FLUDEOXYGLUCOSE F - 18 (FDG) INJECTION
19.0000 | Freq: Once | INTRAVENOUS | Status: AC | PRN
  Administered 2012-09-24: 19 via INTRAVENOUS

## 2012-09-30 ENCOUNTER — Other Ambulatory Visit: Payer: Self-pay | Admitting: *Deleted

## 2012-09-30 DIAGNOSIS — H209 Unspecified iridocyclitis: Secondary | ICD-10-CM

## 2012-09-30 DIAGNOSIS — C4359 Malignant melanoma of other part of trunk: Secondary | ICD-10-CM

## 2012-09-30 DIAGNOSIS — C7800 Secondary malignant neoplasm of unspecified lung: Secondary | ICD-10-CM

## 2012-09-30 MED ORDER — DEXAMETHASONE 0.1 % OP SUSP: 1.0000 [drp] | OPHTHALMIC | Status: DC

## 2012-09-30 NOTE — Telephone Encounter (Signed)
Pt called with c/o red, itchy, & scaly eyes. She was noted to be congested but stated "I think it is sinus..it happens from time to time but it doesn't usually cause eye problems". Also thought she had an appt with Dr Terri Piedra this week but it isn't until Jan. Continues to have the ongoing rash on her legs. Asked if he could be contacted to have her appt moved up. Reviewed with Dr Myna Hidalgo. To hold Zelboraf until seen by him on 11/5 and begin Decadron eye gtts. Also made an appt to be seen by Dr Terri Piedra tomorrow at 2:40p to evaluate her leg rash. She verbalized understanding of the instructions and was provided reassurance about holding the Zelboraf until 11/5. Explained it is the only way to determine if the symptoms she is having is directly related to the medication. It is important to know this. She again verbalized understanding.

## 2012-10-01 ENCOUNTER — Other Ambulatory Visit: Payer: Self-pay | Admitting: Dermatology

## 2012-10-07 ENCOUNTER — Other Ambulatory Visit (HOSPITAL_BASED_OUTPATIENT_CLINIC_OR_DEPARTMENT_OTHER): Payer: BC Managed Care – PPO | Admitting: Lab

## 2012-10-07 ENCOUNTER — Ambulatory Visit (HOSPITAL_BASED_OUTPATIENT_CLINIC_OR_DEPARTMENT_OTHER): Payer: BC Managed Care – PPO | Admitting: Hematology & Oncology

## 2012-10-07 VITALS — BP 137/91 | HR 91 | Temp 97.9°F | Resp 16 | Ht 65.0 in | Wt 136.0 lb

## 2012-10-07 DIAGNOSIS — C7800 Secondary malignant neoplasm of unspecified lung: Secondary | ICD-10-CM

## 2012-10-07 DIAGNOSIS — D34 Benign neoplasm of thyroid gland: Secondary | ICD-10-CM

## 2012-10-07 DIAGNOSIS — C4359 Malignant melanoma of other part of trunk: Secondary | ICD-10-CM

## 2012-10-07 LAB — CBC WITH DIFFERENTIAL (CANCER CENTER ONLY)
BASO%: 0.5 % (ref 0.0–2.0)
EOS%: 3 % (ref 0.0–7.0)
HCT: 40.1 % (ref 34.8–46.6)
LYMPH#: 1.4 10*3/uL (ref 0.9–3.3)
LYMPH%: 25.6 % (ref 14.0–48.0)
MCHC: 33.9 g/dL (ref 32.0–36.0)
NEUT%: 60.3 % (ref 39.6–80.0)
Platelets: 242 10*3/uL (ref 145–400)
RDW: 13.8 % (ref 11.1–15.7)

## 2012-10-07 NOTE — Progress Notes (Signed)
This office note has been dictated.

## 2012-10-08 NOTE — Progress Notes (Signed)
CC:   Susan Naas, MD Susan Quint Plotnikov, MD  DIAGNOSES: 1. Metastatic melanoma. 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  CURRENT THERAPY:  Zelboraf-patient to start 720 mg p.o. b.i.d. (the patient will then go back up to 960 mg p.o. b.i.d. in 2 weeks).  INTERIM HISTORY:  [name] comes in for followup.  She is doing very well right now.  She has really had no complaints since we last talked to her.  She has had some skin issues with the Zelboraf.  She has some __________ with the Zelboraf.  I went ahead and held her Zelboraf for a week or so. She went to Dr. Terri Piedra of Dermatology.  He did some skin biopsies.  The biopsies showed granuloma annulare.  We did go ahead and repeat her PET scan.  This was done a couple of weeks ago.  PET scan did not show any obvious melanoma recurrence.  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  The PET scan did show activity in the right thyroid.  I will have to call the surgeon at [facility] to let him know how this is going to affect any kind of surgery.  She has had no problem with bowels or bladder.  She has had no diarrhea or constipation.  She has had no swallowing difficulties.  PHYSICAL EXAMINATION:  General:  This is a well-developed, well- nourished white female in no obvious distress.  Vital signs: Temperature of 97.9, pulse 91, respiratory rate 16, blood pressure 137/91.  Weight is 136.  Head and neck:  Normocephalic, atraumatic skull.  There are no ocular or oral lesions.  There are no palpable cervical or supraclavicular lymph nodes.  Lungs:  Clear bilaterally. Cardiac:  Regular rate and rhythm with a normal S1 and S2.  There are no murmurs, rubs, or bruits.  Abdomen:  Soft with good bowel sounds.  There is no palpable abdominal mass.  There is no palpable hepatosplenomegaly. Extremities:  No clubbing, cyanosis, or edema.  Skin:  Skin lesions and __________ on her legs.  LABORATORY STUDIES:  White cell count  is 5.6, hemoglobin 13.6, hematocrit 40.1, platelet count 242.  IMPRESSION:  [name] is a 61 year old white female with metastatic melanoma.  She is doing very nicely on Zelboraf.  Again, will restart her Zelboraf at 720 mg p.o. b.i.d.  Then I will plan to increase the Zelboraf up to 960 mg twice a day.  Again, I will have to speak with the surgeon at [facility] about the Hurthle cell tumor.  She needs surgery.  She wants to wait until after the holidays.  I thought that this probably would be okay.  I will plan to see her back in about 1 month for followup.    ______________________________ [name], M.D. PRE/MEDQ  D:  10/07/2012  T:  10/08/2012  Job:  [id number]

## 2012-10-09 ENCOUNTER — Encounter: Payer: Self-pay | Admitting: *Deleted

## 2012-10-09 ENCOUNTER — Other Ambulatory Visit: Payer: BC Managed Care – PPO | Admitting: Lab

## 2012-10-09 ENCOUNTER — Ambulatory Visit: Payer: BC Managed Care – PPO | Admitting: Hematology & Oncology

## 2012-10-09 ENCOUNTER — Other Ambulatory Visit: Payer: Self-pay | Admitting: Internal Medicine

## 2012-10-10 ENCOUNTER — Other Ambulatory Visit: Payer: Self-pay | Admitting: Internal Medicine

## 2012-10-10 ENCOUNTER — Encounter: Payer: Self-pay | Admitting: *Deleted

## 2012-10-10 NOTE — Telephone Encounter (Signed)
Pt requesting refill on her klonopin. Is this ok to refill?..Susan Mccarthy

## 2012-10-13 ENCOUNTER — Ambulatory Visit (INDEPENDENT_AMBULATORY_CARE_PROVIDER_SITE_OTHER): Payer: BC Managed Care – PPO | Admitting: Internal Medicine

## 2012-10-13 ENCOUNTER — Encounter: Payer: Self-pay | Admitting: Internal Medicine

## 2012-10-13 VITALS — BP 108/70 | HR 76 | Temp 98.3°F | Resp 16 | Wt 136.0 lb

## 2012-10-13 DIAGNOSIS — C439 Malignant melanoma of skin, unspecified: Secondary | ICD-10-CM

## 2012-10-13 DIAGNOSIS — H6120 Impacted cerumen, unspecified ear: Secondary | ICD-10-CM

## 2012-10-13 DIAGNOSIS — F329 Major depressive disorder, single episode, unspecified: Secondary | ICD-10-CM

## 2012-10-13 DIAGNOSIS — E538 Deficiency of other specified B group vitamins: Secondary | ICD-10-CM

## 2012-10-13 DIAGNOSIS — E041 Nontoxic single thyroid nodule: Secondary | ICD-10-CM

## 2012-10-13 DIAGNOSIS — C4359 Malignant melanoma of other part of trunk: Secondary | ICD-10-CM

## 2012-10-13 DIAGNOSIS — C7800 Secondary malignant neoplasm of unspecified lung: Secondary | ICD-10-CM

## 2012-10-13 MED ORDER — CLONAZEPAM 1 MG PO TABS: 1.0000 mg | ORAL_TABLET | Freq: Three times a day (TID) | ORAL | Status: DC | PRN

## 2012-10-13 MED ORDER — DICLOFENAC SODIUM 1 % TD GEL: 1.0000 "application " | Freq: Four times a day (QID) | TRANSDERMAL | Status: DC

## 2012-10-13 MED ORDER — FLUOXETINE HCL 40 MG PO CAPS: 40.0000 mg | ORAL_CAPSULE | Freq: Every day | ORAL | Status: DC

## 2012-10-13 NOTE — Assessment & Plan Note (Signed)
Continue with current prescription therapy as reflected on the Med list.  

## 2012-10-13 NOTE — Assessment & Plan Note (Signed)
10/13 PET - no recurrence

## 2012-10-13 NOTE — Assessment & Plan Note (Signed)
See procedure 

## 2012-10-13 NOTE — Assessment & Plan Note (Signed)
Continue with current prescription therapy as reflected on the Med list. Nutritionist consult

## 2012-10-13 NOTE — Assessment & Plan Note (Signed)
We increased Fluoxetine dose

## 2012-10-13 NOTE — Progress Notes (Signed)
Subjective:    Patient ID: Susan Mccarthy, female    DOB: 06-19-1951, 61 y.o.   MRN: [id number]  Ear Fullness  There is pain in both ears. This is a new problem. Associated symptoms include coughing. Pertinent negatives include no diarrhea, headaches, hearing loss, neck pain, rash, rhinorrhea or sore throat. The treatment provided no relief.  Cough This is a new problem. The current episode started in the past 7 days. The problem has been gradually worsening. Pertinent negatives include no chest pain (R anter lower chest), chills, ear pain, eye redness, fever, headaches, postnasal drip, rash, rhinorrhea, sore throat, shortness of breath or wheezing. Her past medical history is significant for COPD.   F/u R CP and rib fx's - resolved C/o L axilla LN pain - resolved F/u melanoma C/o insomnia and anxiety F/u Vit B12 and D def (not using inj Vit B12 qd lately x months)  Wt Readings from Last 3 Encounters:  10/13/12 136 lb (61.689 kg)  10/07/12 136 lb (61.689 kg)  08/18/12 137 lb (62.143 kg)   BP Readings from Last 3 Encounters:  10/13/12 108/70  10/07/12 137/91  08/18/12 115/72     Review of Systems  Constitutional: Positive for fatigue. Negative for fever, chills, diaphoresis, activity change, appetite change and unexpected weight change.  HENT: Negative for hearing loss, ear pain, nosebleeds, congestion, sore throat, facial swelling, rhinorrhea, sneezing, mouth sores, trouble swallowing, neck pain, neck stiffness, postnasal drip, sinus pressure and tinnitus.   Eyes: Negative for pain, discharge, redness, itching and visual disturbance.  Respiratory: Positive for cough. Negative for chest tightness, shortness of breath, wheezing and stridor.   Cardiovascular: Negative for chest pain (R anter lower chest), palpitations and leg swelling.  Gastrointestinal: Negative for nausea, diarrhea, constipation, blood in stool, abdominal distention, anal bleeding and rectal pain.  Genitourinary:  Negative for dysuria, urgency, frequency, hematuria, flank pain, vaginal bleeding, vaginal discharge, difficulty urinating, genital sores and pelvic pain.  Musculoskeletal: Negative for back pain, joint swelling, arthralgias and gait problem.  Skin: Negative for rash and wound.  Neurological: Negative for dizziness, tremors, seizures, syncope, speech difficulty, weakness, numbness and headaches.  Hematological: Negative for adenopathy. Does not bruise/bleed easily.  Psychiatric/Behavioral: Positive for sleep disturbance. Negative for suicidal ideas, behavioral problems, dysphoric mood and decreased concentration. The patient is nervous/anxious.         Objective:   Physical Exam  Constitutional: She appears well-developed and well-nourished. No distress.       Not upset Tearful a little  HENT:  Head: Normocephalic.  Right Ear: External ear normal.  Left Ear: External ear normal.  Nose: Nose normal.  Mouth/Throat: Oropharynx is clear and moist.  Eyes: Conjunctivae normal are normal. Pupils are equal, round, and reactive to light. Right eye exhibits no discharge. Left eye exhibits no discharge.  Neck: Normal range of motion. Neck supple. No JVD present. No tracheal deviation present. No thyromegaly present.  Cardiovascular: Normal rate, regular rhythm and normal heart sounds.  Exam reveals no gallop.   No murmur heard. Pulmonary/Chest: No stridor. No respiratory distress. She has no wheezes. She has no rales. She exhibits no tenderness.  Abdominal: Soft. Bowel sounds are normal. She exhibits no distension and no mass. There is no tenderness. There is no rebound and no guarding.  Musculoskeletal: She exhibits no edema and no tenderness.  Lymphadenopathy:    She has no cervical adenopathy.  Neurological: She displays normal reflexes. No cranial nerve deficit. She exhibits normal muscle tone. Coordination normal.  Skin: No rash noted. No erythema.  Psychiatric: She has a normal mood and  affect. Her behavior is normal. Judgment and thought content normal.       Tearful, upset  B wax Hands w/OA Rash on LEs  Lab Results  Component Value Date   WBC 5.6 10/07/2012   HGB 13.6 10/07/2012   HCT 40.1 10/07/2012   PLT 242 10/07/2012   GLUCOSE 93 08/18/2012   ALT 14 08/18/2012   AST 18 08/18/2012   NA 132* 08/18/2012   K 4.3 08/18/2012   CL 97 08/18/2012   CREATININE 0.91 08/18/2012   BUN 11 08/18/2012   CO2 27 08/18/2012   TSH 0.85 08/16/2011   PET   Procedure Note :     Procedure :  Ear irrigation   Indication:  Cerumen impaction   Risks, including pain, dizziness, eardrum perforation, bleeding, infection and others as well as benefits were explained to the patient in detail. Verbal consent was obtained and the patient agreed to proceed.    We used "The [employer] Device" field with lukewarm water for irrigation. A large amount wax was recovered. Procedure has also required manual wax removal with an ear loop.   Tolerated well. Complications: None.   Postprocedure instructions :  Call if problems.        Assessment & Plan:

## 2012-10-13 NOTE — Assessment & Plan Note (Signed)
Surgery is being planned at [facility]

## 2012-10-20 ENCOUNTER — Other Ambulatory Visit: Payer: Self-pay | Admitting: *Deleted

## 2012-10-20 DIAGNOSIS — C439 Malignant melanoma of skin, unspecified: Secondary | ICD-10-CM

## 2012-10-20 NOTE — Telephone Encounter (Signed)
Received a fax from [employer] requesting authorization to refill Zelboraf 240 mg (4 tabs BID). This is due per the last office note per Dr Myna Hidalgo. Will route it to him for approval & signature then fax back to [facility] at [phone].

## 2012-10-21 MED ORDER — VEMURAFENIB 240 MG PO TABS: 960.0000 mg | ORAL_TABLET | Freq: Two times a day (BID) | ORAL | Status: DC

## 2012-10-23 ENCOUNTER — Other Ambulatory Visit: Payer: Self-pay | Admitting: *Deleted

## 2012-10-23 DIAGNOSIS — C439 Malignant melanoma of skin, unspecified: Secondary | ICD-10-CM

## 2012-10-23 DIAGNOSIS — C4359 Malignant melanoma of other part of trunk: Secondary | ICD-10-CM

## 2012-10-23 DIAGNOSIS — F909 Attention-deficit hyperactivity disorder, unspecified type: Secondary | ICD-10-CM

## 2012-10-23 DIAGNOSIS — R0789 Other chest pain: Secondary | ICD-10-CM

## 2012-10-23 MED ORDER — AMPHETAMINE-DEXTROAMPHETAMINE 10 MG PO TABS: 10.0000 mg | ORAL_TABLET | Freq: Every day | ORAL | Status: DC

## 2012-10-23 MED ORDER — OXYCODONE-ACETAMINOPHEN 5-325 MG PO TABS: 1.0000 | ORAL_TABLET | Freq: Four times a day (QID) | ORAL | Status: DC | PRN

## 2012-10-23 NOTE — Telephone Encounter (Signed)
Pt left message on voicemail yesterday inquiring about her Zelboraf rx and wants to come by to pick up a rx for Percocet & Adderall. Rx for Zelboraf was faxed back to [facility] on 10/22/12. Will send Adderall & Percocet to Dr. Myna Hidalgo for approval and place at the front desk for pick up.

## 2012-11-07 ENCOUNTER — Ambulatory Visit (HOSPITAL_BASED_OUTPATIENT_CLINIC_OR_DEPARTMENT_OTHER): Payer: BC Managed Care – PPO | Admitting: Hematology & Oncology

## 2012-11-07 ENCOUNTER — Other Ambulatory Visit (HOSPITAL_BASED_OUTPATIENT_CLINIC_OR_DEPARTMENT_OTHER): Payer: BC Managed Care – PPO | Admitting: Lab

## 2012-11-07 DIAGNOSIS — C7800 Secondary malignant neoplasm of unspecified lung: Secondary | ICD-10-CM

## 2012-11-07 DIAGNOSIS — R0789 Other chest pain: Secondary | ICD-10-CM

## 2012-11-07 DIAGNOSIS — C439 Malignant melanoma of skin, unspecified: Secondary | ICD-10-CM

## 2012-11-07 DIAGNOSIS — C4359 Malignant melanoma of other part of trunk: Secondary | ICD-10-CM

## 2012-11-07 DIAGNOSIS — D34 Benign neoplasm of thyroid gland: Secondary | ICD-10-CM

## 2012-11-07 DIAGNOSIS — F909 Attention-deficit hyperactivity disorder, unspecified type: Secondary | ICD-10-CM

## 2012-11-07 DIAGNOSIS — H11429 Conjunctival edema, unspecified eye: Secondary | ICD-10-CM

## 2012-11-07 LAB — CBC WITH DIFFERENTIAL (CANCER CENTER ONLY)
BASO%: 0.6 % (ref 0.0–2.0)
EOS%: 4 % (ref 0.0–7.0)
LYMPH#: 0.7 10*3/uL — ABNORMAL LOW (ref 0.9–3.3)
MCHC: 34.2 g/dL (ref 32.0–36.0)
NEUT#: 2.2 10*3/uL (ref 1.5–6.5)
RDW: 14.3 % (ref 11.1–15.7)
WBC: 3.5 10*3/uL — ABNORMAL LOW (ref 3.9–10.0)

## 2012-11-07 LAB — COMPREHENSIVE METABOLIC PANEL
ALT: 15 U/L (ref 0–35)
AST: 19 U/L (ref 0–37)
Albumin: 4.7 g/dL (ref 3.5–5.2)
Calcium: 9.8 mg/dL (ref 8.4–10.5)
Chloride: 98 mEq/L (ref 96–112)
Potassium: 4.2 mEq/L (ref 3.5–5.3)
Sodium: 132 mEq/L — ABNORMAL LOW (ref 135–145)
Total Protein: 6.9 g/dL (ref 6.0–8.3)

## 2012-11-07 MED ORDER — DEXAMETHASONE 0.1 % OP SUSP: 2.0000 [drp] | OPHTHALMIC | Status: DC

## 2012-11-07 NOTE — Progress Notes (Signed)
This office note has been dictated.

## 2012-11-08 NOTE — Progress Notes (Signed)
CC:   Susan Naas, MD Susan Quint Plotnikov, MD  DIAGNOSES: 1. Metastatic melanoma. 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  CURRENT THERAPY:  Zelboraf 960 mg p.o. b.i.d.  INTERIM HISTORY:  Susan Mccarthy comes in for her followup.  She will have surgery for her thyroid in early January.  She is going to be operated on by Dr. Othella Boyer at [facility].  I think this will be on 01/09.  She will stop the Zelboraf a week before which is okay.  She still has some skin issues.  She sees Dr. Terri Piedra of dermatology.  I think he did give her some cream to apply to affected areas.  She has had some ocular issues with the Zelboraf.  She is on some steroid eyedrops which have helped her.  She has had no nausea or vomiting.  She has had no diarrhea.  There has been no bleeding.  She has had no headache.  Overall, her performance status is ECOG 0-1.  PHYSICAL EXAMINATION:  General:  This is a well-developed, well- nourished white female in no obvious distress.  Vital signs:  Show temperature of 98.2, pulse 85, respiratory rate 16, blood pressure 127/79.  Weight is 131.  Head and neck:  Shows a normocephalic, atraumatic skull.  There are no ocular or oral lesions.  There are no palpable cervical or supraclavicular lymph nodes.  Lungs:  Clear bilaterally.  Cardiac:  Regular rate and rhythm with a normal S1 and S2. There are no murmurs, rubs or bruits.  Abdomen:  Soft with good bowel sounds.  There is no palpable abdominal mass.  There is no palpable hepatosplenomegaly.  Back:  Shows the wide local excision scar in the left mid back.  This is well-healed.  There is no tenderness over the spine, ribs or hips.  Extremities:  Show no clubbing, cyanosis or edema. Skin:  Does show some changes consistent with the Zelboraf. Neurological:  Shows no focal neurological deficits.  LABORATORY STUDIES:  White cell count is 3.5, hemoglobin 13.4, hematocrit 39.2, platelet count 205.  IMPRESSION:  Susan Mccarthy is a  61 year old white female with metastatic melanoma.  She is on Zelboraf.  She has done incredibly well.  Her last PET scan was back in October.  We will plan for a followup PET scan in about 1 month or so.  I think this is a reasonable amount of time for followup for her.  We can get the PET scan after she has her surgery.  I do not see any complications of her thyroid surgery that would be from the melanoma.    ______________________________ Susan Mccarthy, M.D. PRE/MEDQ  D:  11/07/2012  T:  11/08/2012  Job:  [id number]

## 2012-11-27 ENCOUNTER — Other Ambulatory Visit: Payer: Self-pay | Admitting: *Deleted

## 2012-11-27 DIAGNOSIS — F909 Attention-deficit hyperactivity disorder, unspecified type: Secondary | ICD-10-CM

## 2012-11-27 DIAGNOSIS — R0789 Other chest pain: Secondary | ICD-10-CM

## 2012-11-27 DIAGNOSIS — C4359 Malignant melanoma of other part of trunk: Secondary | ICD-10-CM

## 2012-11-27 DIAGNOSIS — C439 Malignant melanoma of skin, unspecified: Secondary | ICD-10-CM

## 2012-11-27 MED ORDER — OXYCODONE-ACETAMINOPHEN 5-325 MG PO TABS: 1.0000 | ORAL_TABLET | Freq: Four times a day (QID) | ORAL | Status: DC | PRN

## 2012-11-27 MED ORDER — AMPHETAMINE-DEXTROAMPHETAMINE 10 MG PO TABS: 10.0000 mg | ORAL_TABLET | Freq: Every day | ORAL | Status: DC

## 2012-12-03 HISTORY — PX: THYROIDECTOMY: SHX17

## 2012-12-08 ENCOUNTER — Other Ambulatory Visit: Payer: Self-pay | Admitting: Internal Medicine

## 2012-12-10 DIAGNOSIS — M1990 Unspecified osteoarthritis, unspecified site: Secondary | ICD-10-CM | POA: Insufficient documentation

## 2012-12-10 DIAGNOSIS — C799 Secondary malignant neoplasm of unspecified site: Secondary | ICD-10-CM | POA: Insufficient documentation

## 2012-12-19 DIAGNOSIS — C73 Malignant neoplasm of thyroid gland: Secondary | ICD-10-CM | POA: Insufficient documentation

## 2012-12-22 ENCOUNTER — Encounter (HOSPITAL_COMMUNITY): Payer: Self-pay

## 2012-12-22 ENCOUNTER — Encounter (HOSPITAL_COMMUNITY)
Admission: RE | Admit: 2012-12-22 | Discharge: 2012-12-22 | Disposition: A | Discharge status: Home / Self Care | Payer: BC Managed Care – PPO | Source: Ambulatory Visit | Attending: Hematology & Oncology | Admitting: Hematology & Oncology

## 2012-12-22 DIAGNOSIS — C4359 Malignant melanoma of other part of trunk: Secondary | ICD-10-CM

## 2012-12-22 LAB — GLUCOSE, CAPILLARY: Glucose-Capillary: 81 mg/dL (ref 70–99)

## 2012-12-22 MED ORDER — FLUDEOXYGLUCOSE F - 18 (FDG) INJECTION
19.7000 | Freq: Once | INTRAVENOUS | Status: AC | PRN
  Administered 2012-12-22: 19.7 via INTRAVENOUS

## 2012-12-25 ENCOUNTER — Telehealth: Payer: Self-pay | Admitting: *Deleted

## 2012-12-25 NOTE — Telephone Encounter (Addendum)
Message copied by Mirian Capuchin on Thu Dec 25, 2012  5:25 PM ------      Message from: [name] R      Created: Tue Dec 23, 2012  7:15 AM       Call - NO melanoma on PET scan!!!! Pete Pt called and given above message.  Pt voiced understanding.

## 2012-12-26 ENCOUNTER — Ambulatory Visit (HOSPITAL_BASED_OUTPATIENT_CLINIC_OR_DEPARTMENT_OTHER): Payer: BC Managed Care – PPO | Admitting: Hematology & Oncology

## 2012-12-26 ENCOUNTER — Other Ambulatory Visit (HOSPITAL_BASED_OUTPATIENT_CLINIC_OR_DEPARTMENT_OTHER): Payer: BC Managed Care – PPO | Admitting: Lab

## 2012-12-26 VITALS — BP 112/69 | HR 75 | Temp 98.2°F | Resp 16 | Ht 65.0 in | Wt 130.0 lb

## 2012-12-26 DIAGNOSIS — C4359 Malignant melanoma of other part of trunk: Secondary | ICD-10-CM

## 2012-12-26 DIAGNOSIS — C439 Malignant melanoma of skin, unspecified: Secondary | ICD-10-CM

## 2012-12-26 DIAGNOSIS — C7800 Secondary malignant neoplasm of unspecified lung: Secondary | ICD-10-CM

## 2012-12-26 DIAGNOSIS — F909 Attention-deficit hyperactivity disorder, unspecified type: Secondary | ICD-10-CM

## 2012-12-26 DIAGNOSIS — R0789 Other chest pain: Secondary | ICD-10-CM

## 2012-12-26 LAB — COMPREHENSIVE METABOLIC PANEL
ALT: 16 U/L (ref 0–35)
CO2: 23 mEq/L (ref 19–32)
Creatinine, Ser: 0.93 mg/dL (ref 0.50–1.10)
Total Bilirubin: 0.4 mg/dL (ref 0.3–1.2)

## 2012-12-26 LAB — CBC WITH DIFFERENTIAL (CANCER CENTER ONLY)
Eosinophils Absolute: 0.2 10*3/uL (ref 0.0–0.5)
LYMPH%: 17.7 % (ref 14.0–48.0)
MCH: 27.4 pg (ref 26.0–34.0)
MCV: 80 fL — ABNORMAL LOW (ref 81–101)
MONO%: 13.8 % — ABNORMAL HIGH (ref 0.0–13.0)
Platelets: 247 10*3/uL (ref 145–400)
RBC: 4.68 10*6/uL (ref 3.70–5.32)
RDW: 15.6 % (ref 11.1–15.7)

## 2012-12-26 LAB — LACTATE DEHYDROGENASE: LDH: 175 U/L (ref 94–250)

## 2012-12-26 MED ORDER — OXYCODONE-ACETAMINOPHEN 5-325 MG PO TABS: 1.0000 | ORAL_TABLET | Freq: Four times a day (QID) | ORAL | Status: DC | PRN

## 2012-12-26 MED ORDER — AMPHETAMINE-DEXTROAMPHETAMINE 10 MG PO TABS: 10.0000 mg | ORAL_TABLET | Freq: Every day | ORAL | Status: DC

## 2012-12-26 NOTE — Progress Notes (Signed)
This office note has been dictated.

## 2012-12-27 NOTE — Progress Notes (Signed)
CC:   Susan Naas, MD Georgina Quint Plotnikov, MD  DIAGNOSES: 1. Metastatic melanoma. 2. 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  CURRENT THERAPY: 1. Zelboraf 960 mg p.o. b.i.d. 2. Patient is status post thyroidectomy.  INTERIM HISTORY:  Susan Mccarthy comes in for a followup.  She did go to [facility] to have her thyroid removed.  She did this without any complications.  She apparently did have some hypercalcemia from what she tells me.  Her thyroidectomy was on, I think, the 16th of January.  The pathology report from [facility] shows a follicular carcinoma with Hurthle cell differentiation.  The tumor measured 1.5 cm.  There were 2 smaller additional tumors that measured 0.4 cm and 0.2 cm, respectively.  There was no vascular invasion.  There was no tumor necrosis.  Two lymph nodes were negative.  She is to see an endocrinologist.  She is not sure when this is going to be set up.  We did set up a PET scan on her.  This is so that we can follow her melanoma.  The PET scan was done on the 20th.  PET scan showed no obvious melanoma.  She is doing well with the Zelboraf.  She is not complaining of eye problems.  She uses eyedrops on occasion.  Her skin is still on the dry side.  The patient has not noticed any bleeding.  There is no change in bowel or bladder habits.  She does have occasional diarrhea alternating with constipation.  She has had no headache.  Her eyes are doing better.  PHYSICAL EXAMINATION:  General:  This is a well-developed, well- nourished white female in no obvious distress.  Vital signs: Temperature 98.2, pulse 75, respiratory rate 16, blood pressure 112/69. Weight is 130.  Head and neck:  Normocephalic, atraumatic skull.  There are no ocular or oral lesions.  Lymph nodes:  There are no palpable cervical or supraclavicular lymph nodes.  Lungs:  Clear bilaterally. Cardiac:  Regular rate and rhythm with a normal S1 and S2.  There are  no murmurs, rubs, or bruits.  Abdomen:  Soft with good bowel sounds.  There is no palpable abdominal mass.  There is no fluid wave.  There is no palpable hepatosplenomegaly.  Back:  No tenderness over the spine, ribs, or hips.  Extremities:  No clubbing, cyanosis, or edema.  Skin:  Her thyroid region shows the healing thyroidectomy scar.  Skin exam shows some dry skin.  She may have some actinic keratoses.  I do not see any obvious skin cancers.  LABORATORY STUDIES:  White cell count is 4.4, hemoglobin 12.8, hematocrit 37.6, platelet count is 247.  IMPRESSION:  Susan Mccarthy is a 62 year old white female with metastatic melanoma.  She actually is doing incredibly well.  She initially I think was diagnosed back in 2012.  She had locally advanced disease.  She received interferon, but quickly progressed to metastatic disease.  She is mutated for the BRAF gene.  She has been on Zelboraf now probably for about a year.  She has had no evidence of progression.  She really does not like doing scans.  I can understand this.  We will plan for a followup PET scan on her in 6 months.  I will plan to see her back in 3 months.  Again, [employer] will set up her appointment with an endocrinologist for thyroid replacement therapy.    ______________________________ [name], M.D. PRE/MEDQ  D:  12/26/2012  T:  12/27/2012  Job:  [id number]

## 2012-12-29 ENCOUNTER — Telehealth: Payer: Self-pay | Admitting: *Deleted

## 2012-12-29 NOTE — Telephone Encounter (Addendum)
Message copied by Mirian Capuchin on Mon Dec 29, 2012  3:00 PM ------      Message from: [name]      Created: Sun Dec 28, 2012  4:09 PM       Call - labs are very good!!!  Susan Mccarthy Pt called on private mobile phone and given the above message.  Pt voiced understanding.

## 2013-01-12 ENCOUNTER — Ambulatory Visit (INDEPENDENT_AMBULATORY_CARE_PROVIDER_SITE_OTHER): Payer: BC Managed Care – PPO | Admitting: Internal Medicine

## 2013-01-12 ENCOUNTER — Encounter: Payer: Self-pay | Admitting: Internal Medicine

## 2013-01-12 VITALS — BP 130/80 | Temp 98.4°F | Wt 127.0 lb

## 2013-01-12 DIAGNOSIS — K219 Gastro-esophageal reflux disease without esophagitis: Secondary | ICD-10-CM

## 2013-01-12 DIAGNOSIS — F419 Anxiety disorder, unspecified: Secondary | ICD-10-CM

## 2013-01-12 DIAGNOSIS — C4359 Malignant melanoma of other part of trunk: Secondary | ICD-10-CM

## 2013-01-12 DIAGNOSIS — E538 Deficiency of other specified B group vitamins: Secondary | ICD-10-CM

## 2013-01-12 DIAGNOSIS — F329 Major depressive disorder, single episode, unspecified: Secondary | ICD-10-CM

## 2013-01-12 DIAGNOSIS — F411 Generalized anxiety disorder: Secondary | ICD-10-CM

## 2013-01-12 DIAGNOSIS — C439 Malignant melanoma of skin, unspecified: Secondary | ICD-10-CM

## 2013-01-12 DIAGNOSIS — F32A Depression, unspecified: Secondary | ICD-10-CM

## 2013-01-12 DIAGNOSIS — C7800 Secondary malignant neoplasm of unspecified lung: Secondary | ICD-10-CM

## 2013-01-12 DIAGNOSIS — E041 Nontoxic single thyroid nodule: Secondary | ICD-10-CM

## 2013-01-12 DIAGNOSIS — E039 Hypothyroidism, unspecified: Secondary | ICD-10-CM

## 2013-01-12 DIAGNOSIS — C73 Malignant neoplasm of thyroid gland: Secondary | ICD-10-CM

## 2013-01-12 MED ORDER — RANITIDINE HCL 300 MG PO TABS: 300.0000 mg | ORAL_TABLET | Freq: Every day | ORAL | Status: DC

## 2013-01-12 MED ORDER — CYANOCOBALAMIN 1000 MCG/ML IJ SOLN: 1000.0000 ug | INTRAMUSCULAR | Status: DC

## 2013-01-12 NOTE — Assessment & Plan Note (Signed)
Being monitored

## 2013-01-12 NOTE — Assessment & Plan Note (Signed)
Continue with current prescription therapy as reflected on the Med list.  

## 2013-01-12 NOTE — Assessment & Plan Note (Signed)
Off rx 1/14 due to high Ca

## 2013-01-12 NOTE — Assessment & Plan Note (Addendum)
Continue with current prescription therapy as reflected on the Med list. PET q 6 mo

## 2013-01-12 NOTE — Assessment & Plan Note (Signed)
cancer

## 2013-01-12 NOTE — Progress Notes (Signed)
   Subjective:     HPI   F/u thyroid ca - s/p thyroidectomy. Vit D was stopped due to high Ca. She had a stress ECHO pre-op F/u R CP and rib fx's - resolved C/o L axilla LN pain - resolved F/u melanoma C/o insomnia and anxiety F/u Vit B12 and D def   Wt Readings from Last 3 Encounters:  01/12/13 127 lb (57.607 kg)  12/26/12 130 lb (58.968 kg)  10/13/12 136 lb (61.689 kg)   BP Readings from Last 3 Encounters:  01/12/13 130/80  12/26/12 112/69  10/13/12 108/70     Review of Systems  Constitutional: Positive for fatigue. Negative for diaphoresis, activity change, appetite change and unexpected weight change.  HENT: Negative for nosebleeds, congestion, facial swelling, sneezing, mouth sores, trouble swallowing, neck stiffness, sinus pressure and tinnitus.   Eyes: Negative for pain, discharge, redness, itching and visual disturbance.  Respiratory: Negative for chest tightness and stridor.   Cardiovascular: Negative for palpitations and leg swelling.  Gastrointestinal: Negative for nausea, constipation, blood in stool, abdominal distention, anal bleeding and rectal pain.  Genitourinary: Negative for dysuria, urgency, frequency, hematuria, flank pain, vaginal bleeding, vaginal discharge, difficulty urinating, genital sores and pelvic pain.  Musculoskeletal: Negative for back pain, joint swelling, arthralgias and gait problem.  Skin: Negative for wound.  Neurological: Negative for dizziness, tremors, seizures, syncope, speech difficulty, weakness and numbness.  Hematological: Negative for adenopathy. Does not bruise/bleed easily.  Psychiatric/Behavioral: Positive for sleep disturbance. Negative for suicidal ideas, behavioral problems, dysphoric mood and decreased concentration. The patient is nervous/anxious.         Objective:   Physical Exam  Constitutional: She appears well-developed and well-nourished. No distress.  Not upset Tearful a little  HENT:  Head: Normocephalic.   Right Ear: External ear normal.  Left Ear: External ear normal.  Nose: Nose normal.  Mouth/Throat: Oropharynx is clear and moist.  Eyes: Conjunctivae are normal. Pupils are equal, round, and reactive to light. Right eye exhibits no discharge. Left eye exhibits no discharge.  Neck: Normal range of motion. Neck supple. No JVD present. No tracheal deviation present. No thyromegaly present.  Cardiovascular: Normal rate, regular rhythm and normal heart sounds.  Exam reveals no gallop.   No murmur heard. Pulmonary/Chest: No stridor. No respiratory distress. She has no wheezes. She has no rales. She exhibits no tenderness.  Abdominal: Soft. Bowel sounds are normal. She exhibits no distension and no mass. There is no tenderness. There is no rebound and no guarding.  Musculoskeletal: She exhibits no edema and no tenderness.  Lymphadenopathy:    She has no cervical adenopathy.  Neurological: She displays normal reflexes. No cranial nerve deficit. She exhibits normal muscle tone. Coordination normal.  Skin: No rash noted. No erythema.  Psychiatric: She has a normal mood and affect. Her behavior is normal. Judgment and thought content normal.  Tearful, upset   Hands w/OA   Lab Results  Component Value Date   WBC 4.4 12/26/2012   HGB 12.8 12/26/2012   HCT 37.6 12/26/2012   PLT 247 12/26/2012   GLUCOSE 82 12/26/2012   ALT 16 12/26/2012   AST 19 12/26/2012   NA 136 12/26/2012   K 4.0 12/26/2012   CL 100 12/26/2012   CREATININE 0.93 12/26/2012   BUN 9 12/26/2012   CO2 23 12/26/2012   TSH 0.85 08/16/2011   PET         Assessment & Plan:

## 2013-01-27 ENCOUNTER — Other Ambulatory Visit: Payer: Self-pay | Admitting: *Deleted

## 2013-01-30 ENCOUNTER — Other Ambulatory Visit: Payer: Self-pay | Admitting: *Deleted

## 2013-01-30 DIAGNOSIS — C439 Malignant melanoma of skin, unspecified: Secondary | ICD-10-CM

## 2013-01-30 MED ORDER — VEMURAFENIB 240 MG PO TABS
960.0000 mg | ORAL_TABLET | Freq: Two times a day (BID) | ORAL | Status: DC
Start: 2013-01-30 — End: 2013-06-24

## 2013-01-30 NOTE — Telephone Encounter (Signed)
Received refill request from [facility] for Zelboraf. This is a chronic a med for the pt. Sent via e-rx.

## 2013-02-05 ENCOUNTER — Other Ambulatory Visit: Payer: Self-pay | Admitting: Medical

## 2013-02-05 DIAGNOSIS — C4359 Malignant melanoma of other part of trunk: Secondary | ICD-10-CM

## 2013-02-06 ENCOUNTER — Other Ambulatory Visit: Payer: BC Managed Care – PPO | Admitting: Lab

## 2013-02-06 ENCOUNTER — Ambulatory Visit: Payer: BC Managed Care – PPO | Admitting: Medical

## 2013-02-11 ENCOUNTER — Other Ambulatory Visit: Payer: Self-pay | Admitting: *Deleted

## 2013-02-11 DIAGNOSIS — C7800 Secondary malignant neoplasm of unspecified lung: Secondary | ICD-10-CM

## 2013-02-11 DIAGNOSIS — C4359 Malignant melanoma of other part of trunk: Secondary | ICD-10-CM

## 2013-02-11 DIAGNOSIS — F329 Major depressive disorder, single episode, unspecified: Secondary | ICD-10-CM

## 2013-02-11 MED ORDER — OXYCODONE-ACETAMINOPHEN 5-325 MG PO TABS: 1.0000 | ORAL_TABLET | Freq: Four times a day (QID) | ORAL | Status: DC | PRN

## 2013-02-11 MED ORDER — AMPHETAMINE-DEXTROAMPHETAMINE 10 MG PO TABS: 10.0000 mg | ORAL_TABLET | Freq: Every day | ORAL | Status: DC

## 2013-02-11 MED ORDER — PROMETHAZINE HCL 12.5 MG PO TABS: 12.5000 mg | ORAL_TABLET | Freq: Four times a day (QID) | ORAL | Status: DC | PRN

## 2013-02-11 NOTE — Telephone Encounter (Signed)
Pt called requesting to come by to pick up a rx for Percocet, Adderall, & Phenergan. Will route rx's to Eunice Blase, PA for approval and signature then place at the front desk for pick up.

## 2013-02-16 ENCOUNTER — Other Ambulatory Visit: Payer: Self-pay | Admitting: Internal Medicine

## 2013-02-17 ENCOUNTER — Other Ambulatory Visit: Payer: Self-pay | Admitting: Internal Medicine

## 2013-02-20 ENCOUNTER — Encounter: Payer: Self-pay | Admitting: Hematology & Oncology

## 2013-02-20 NOTE — Progress Notes (Signed)
Faxed Prior Review form for Zelboraf to [facility] for doctor's signature and advised to fax form to [facility] [phone].

## 2013-02-23 ENCOUNTER — Other Ambulatory Visit (HOSPITAL_BASED_OUTPATIENT_CLINIC_OR_DEPARTMENT_OTHER): Payer: BC Managed Care – PPO | Admitting: Lab

## 2013-02-23 ENCOUNTER — Ambulatory Visit (HOSPITAL_BASED_OUTPATIENT_CLINIC_OR_DEPARTMENT_OTHER): Payer: BC Managed Care – PPO | Admitting: Medical

## 2013-02-23 VITALS — BP 113/75 | HR 16 | Temp 98.2°F | Resp 83 | Ht 65.0 in | Wt 126.0 lb

## 2013-02-23 DIAGNOSIS — C73 Malignant neoplasm of thyroid gland: Secondary | ICD-10-CM

## 2013-02-23 DIAGNOSIS — C7800 Secondary malignant neoplasm of unspecified lung: Secondary | ICD-10-CM

## 2013-02-23 DIAGNOSIS — C4359 Malignant melanoma of other part of trunk: Secondary | ICD-10-CM

## 2013-02-23 LAB — CBC WITH DIFFERENTIAL (CANCER CENTER ONLY)
BASO%: 0.7 % (ref 0.0–2.0)
Eosinophils Absolute: 0.1 10*3/uL (ref 0.0–0.5)
HCT: 40.3 % (ref 34.8–46.6)
LYMPH#: 1.2 10*3/uL (ref 0.9–3.3)
MONO#: 0.5 10*3/uL (ref 0.1–0.9)
NEUT%: 62.1 % (ref 39.6–80.0)
Platelets: 263 10*3/uL (ref 145–400)
RBC: 4.91 10*6/uL (ref 3.70–5.32)
RDW: 14.8 % (ref 11.1–15.7)
WBC: 4.6 10*3/uL (ref 3.9–10.0)

## 2013-02-23 LAB — COMPREHENSIVE METABOLIC PANEL
ALT: 15 U/L (ref 0–35)
CO2: 25 mEq/L (ref 19–32)
Calcium: 9.8 mg/dL (ref 8.4–10.5)
Chloride: 99 mEq/L (ref 96–112)
Glucose, Bld: 96 mg/dL (ref 70–99)
Sodium: 132 mEq/L — ABNORMAL LOW (ref 135–145)
Total Protein: 7 g/dL (ref 6.0–8.3)

## 2013-02-23 LAB — LACTATE DEHYDROGENASE: LDH: 178 U/L (ref 94–250)

## 2013-02-23 NOTE — Progress Notes (Signed)
DIAGNOSES: 1. Metastatic melanoma. 2. 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  CURRENT THERAPY: 1. Zelboraf 960 mg p.o. b.i.d. 2. Patient is status post thyroidectomy.  INTERIM HISTORY: Susan Mccarthy presents today for an office followup visit.  Overall, she, reports, that she's doing relatively well.  She states, that she's not received her Zelboraf and has been off of it for 3 days, now.  I checked with our staff, who, reports, that the medication needed a prior authorization which has already been taken care of, and she should be receiving the medication this week.  She continues to followup with an endocrinologist down at [facility].  She is status post a thyroidectomy for follicular carcinoma of the thyroid.  She's not reporting any new problems.  She does alternate between, diarrhea, and constipation.  She uses eyedrops, on occasion.  For dry eyes.  Her skin.  Still remains on the dry side.  She, reports, that she's had a rash on her lower legs, and is following up with her dermatologist.  She utilizes her anti-emetics every now and then for nausea.  She otherwise states she has a decent, appetite.  She denies any active, vomiting.  She denies any chest pain, shortness of breath, or cough.  Any fevers, chills, or night sweats.  She denies any abdominal pain, any obvious, or abnormal bleeding.  She denies any headaches, visual changes, dysphasia or odynophagia.   Review of Systems: Constitutional:Negative for malaise/fatigue, fever, chills, weight loss, diaphoresis, activity change, appetite change, and unexpected weight change.  HEENT: Negative for double vision, blurred vision, visual loss, ear pain, tinnitus, congestion, rhinorrhea, epistaxis sore throat or sinus disease, oral pain/lesion, tongue soreness Respiratory: Negative for cough, chest tightness, shortness of breath, wheezing and stridor.  Cardiovascular: Negative for chest pain, palpitations, leg swelling,  orthopnea, PND, DOE or claudication Gastrointestinal: Negative for nausea, vomiting, abdominal pain, diarrhea, constipation, blood in stool, melena, hematochezia, abdominal distention, anal bleeding, rectal pain, anorexia and hematemesis.  Genitourinary: Negative for dysuria, frequency, hematuria,  Musculoskeletal: Negative for myalgias, back pain, joint swelling, arthralgias and gait problem.  Skin: Negative for rash, color change, pallor and wound.  Neurological:. Negative for dizziness/light-headedness, tremors, seizures, syncope, facial asymmetry, speech difficulty, weakness, numbness, headaches and paresthesias.  Hematological: Negative for adenopathy. Does not bruise/bleed easily.  Psychiatric/Behavioral:  Negative for depression, no loss of interest in normal activity or change in sleep pattern.   Physical Exam: This is a well-developed, well-nourished, 62 year old, white female, in no obvious distress Vitals: Temperature 98.2 degrees, pulse 83, respirations 16, blood pressure 113/75.  Weight 126 pounds HEENT reveals a normocephalic, atraumatic skull, no scleral icterus, no oral lesions  Neck is supple without any cervical or supraclavicular adenopathy.  Lungs are clear to auscultation bilaterally. There are no wheezes, rales or rhonci Cardiac is regular rate and rhythm with a normal S1 and S2. There are no murmurs, rubs, or bruits.  Abdomen is soft with good bowel sounds, there is no palpable mass. There is no palpable hepatosplenomegaly. There is no palpable fluid wave.  Musculoskeletal no tenderness of the spine, ribs, or hips.  Extremities there are no clubbing, cyanosis, or edema.  Skin no petechia, purpura or ecchymosis Neurologic is nonfocal.  Laboratory Data: White count 4.6, hemoglobin 13.8, hematocrit 40.3, platelets 263,000  Current Outpatient Prescriptions on File Prior to Visit  Medication Sig Dispense Refill  . amphetamine-dextroamphetamine (ADDERALL) 10 MG tablet Take  1 tablet (10 mg total) by mouth daily.  30 tablet  0  . cetirizine-pseudoephedrine (ZYRTEC-D ALLERGY & CONGESTION) 5-120 MG per tablet Take 1 tablet by mouth as needed.      . clonazePAM (KLONOPIN) 1 MG tablet TAKE 1 TABLET BY MOUTH 3 TIMES A DAY AS NEEDED FOR ANXIETY  90 tablet  3  . cyanocobalamin (,VITAMIN B-12,) 1000 MCG/ML injection Inject 1 mL (1,000 mcg total) into the muscle every 30 (thirty) days. On the 17th  10 mL  3  . dexamethasone (DECADRON) 0.1 % ophthalmic suspension Place 2 drops into both eyes every 4 (four) hours.  15 mL  3  . diphenhydrAMINE (BENADRYL) 25 MG tablet Take 25 mg by mouth as needed. allergies      . FLUoxetine (PROZAC) 40 MG capsule Take 1 capsule (40 mg total) by mouth daily.  90 capsule  1  . levothyroxine (SYNTHROID, LEVOTHROID) 100 MCG tablet Take 100 mcg by mouth daily.       Marland Kitchen oxyCODONE-acetaminophen (PERCOCET/ROXICET) 5-325 MG per tablet Take 1 tablet by mouth every 6 (six) hours as needed for pain.  60 tablet  0  . promethazine (PHENERGAN) 12.5 MG tablet Take 1 tablet (12.5 mg total) by mouth every 6 (six) hours as needed. nausea  60 tablet  4  . ranitidine (ZANTAC) 300 MG tablet Take 1 tablet (300 mg total) by mouth at bedtime.  100 tablet  3  . traZODone (DESYREL) 50 MG tablet TAKE 1 TO 2 TABLETS BY MOUTH AT NIGHT FOR SLEEP  60 tablet  5  . vemurafenib (ZELBORAF) 240 MG tablet Take 4 tablets (960 mg total) by mouth every 12 (twelve) hours. Take with water.  240 tablet  3  . diclofenac sodium (VOLTAREN) 1 % GEL Apply 1 application topically 4 (four) times daily.  300 g  3   Current Facility-Administered Medications on File Prior to Visit  Medication Dose Route Frequency Provider Last Rate Last Dose  . LORazepam (ATIVAN) injection 0.5 mg  0.5 mg Intravenous Once [name], MD       Assessment/Plan: This is a 62 year old, white female, with the following issues:  #1.  Metastatic melanoma.  She initially was diagnosed back in 2012.  She had locally  advanced disease.  She received interferon, but quickly progressed to metastatic disease.  She is mutated for the BRAF gene. She has been on Zelboraf for over a year.  She is tolerated this quite well.  Currently, there is no evidence of progression.  She is due for another PET scan in June.  We will go ahead and get that set up.  #2.  Follicular carcinoma with Hurthle cell differentiation of the thyroid.  She continues to followup with an endocrinologist down at [facility].  #3.  Followup.  We will follow back up with Susan Mccarthy in 3 months, but before then should there be questions or concerns.

## 2013-02-25 ENCOUNTER — Telehealth: Payer: Self-pay | Admitting: Oncology

## 2013-02-25 NOTE — Telephone Encounter (Addendum)
Message copied by Lacie Draft on Wed Feb 25, 2013 12:30 PM ------      Message from: [name]      Created: Tue Feb 24, 2013 12:56 PM       Please call her today - labs look ok!!  Cindee Lame ------Spoke with patient regarding labs. Susan Mccarthy, Susan Mccarthy [employer]

## 2013-03-13 ENCOUNTER — Other Ambulatory Visit: Payer: Self-pay | Admitting: *Deleted

## 2013-03-13 ENCOUNTER — Telehealth: Payer: Self-pay | Admitting: *Deleted

## 2013-03-13 MED ORDER — FLUOXETINE HCL 40 MG PO CAPS: 40.0000 mg | ORAL_CAPSULE | Freq: Every day | ORAL | Status: DC

## 2013-03-13 NOTE — Telephone Encounter (Signed)
OK to fill this prescription with additional refills x5 Thank you!  

## 2013-03-13 NOTE — Telephone Encounter (Signed)
Rf req for Trazodone 50 mg. # 90 day supply. Ok to Rf?

## 2013-03-16 MED ORDER — TRAZODONE HCL 50 MG PO TABS: 50.0000 mg | ORAL_TABLET | Freq: Every evening | ORAL | Status: DC | PRN

## 2013-03-16 NOTE — Telephone Encounter (Signed)
Done

## 2013-03-18 ENCOUNTER — Other Ambulatory Visit: Payer: Self-pay | Admitting: *Deleted

## 2013-03-18 DIAGNOSIS — C7800 Secondary malignant neoplasm of unspecified lung: Secondary | ICD-10-CM

## 2013-03-18 DIAGNOSIS — F329 Major depressive disorder, single episode, unspecified: Secondary | ICD-10-CM

## 2013-03-18 DIAGNOSIS — C4359 Malignant melanoma of other part of trunk: Secondary | ICD-10-CM

## 2013-03-18 MED ORDER — OXYCODONE-ACETAMINOPHEN 5-325 MG PO TABS: 1.0000 | ORAL_TABLET | Freq: Four times a day (QID) | ORAL | Status: DC | PRN

## 2013-03-18 MED ORDER — AMPHETAMINE-DEXTROAMPHETAMINE 10 MG PO TABS: 10.0000 mg | ORAL_TABLET | Freq: Every day | ORAL | Status: DC

## 2013-03-18 NOTE — Telephone Encounter (Signed)
Pt called 03/16/13 requesting to come by to pick up a rx for Percocet & Adderall. Will route rx to Dr Myna Hidalgo for approval and signature then place at the front desk for pick up.

## 2013-04-20 ENCOUNTER — Other Ambulatory Visit: Payer: Self-pay | Admitting: *Deleted

## 2013-04-20 DIAGNOSIS — C4359 Malignant melanoma of other part of trunk: Secondary | ICD-10-CM

## 2013-04-20 DIAGNOSIS — C7800 Secondary malignant neoplasm of unspecified lung: Secondary | ICD-10-CM

## 2013-04-20 DIAGNOSIS — F329 Major depressive disorder, single episode, unspecified: Secondary | ICD-10-CM

## 2013-04-20 MED ORDER — AMPHETAMINE-DEXTROAMPHETAMINE 10 MG PO TABS: 10.0000 mg | ORAL_TABLET | Freq: Every day | ORAL | Status: DC

## 2013-04-20 MED ORDER — OXYCODONE-ACETAMINOPHEN 5-325 MG PO TABS: 1.0000 | ORAL_TABLET | Freq: Four times a day (QID) | ORAL | Status: DC | PRN

## 2013-04-20 NOTE — Telephone Encounter (Signed)
Pt called today requesting to come by to pick up a rx for Percocet & Adderall. Will route rx to Dr Myna Hidalgo for approval and signature then place at the front desk for pick up.

## 2013-04-21 ENCOUNTER — Encounter: Payer: Self-pay | Admitting: *Deleted

## 2013-04-21 ENCOUNTER — Telehealth: Payer: Self-pay | Admitting: *Deleted

## 2013-05-04 ENCOUNTER — Ambulatory Visit (INDEPENDENT_AMBULATORY_CARE_PROVIDER_SITE_OTHER): Payer: BC Managed Care – PPO | Admitting: Internal Medicine

## 2013-05-04 ENCOUNTER — Encounter: Payer: Self-pay | Admitting: Internal Medicine

## 2013-05-04 VITALS — BP 102/60 | HR 77 | Temp 97.9°F | Ht 64.0 in | Wt 120.0 lb

## 2013-05-04 DIAGNOSIS — F411 Generalized anxiety disorder: Secondary | ICD-10-CM

## 2013-05-04 DIAGNOSIS — M25572 Pain in left ankle and joints of left foot: Secondary | ICD-10-CM

## 2013-05-04 DIAGNOSIS — R21 Rash and other nonspecific skin eruption: Secondary | ICD-10-CM

## 2013-05-04 DIAGNOSIS — C73 Malignant neoplasm of thyroid gland: Secondary | ICD-10-CM

## 2013-05-04 DIAGNOSIS — M25579 Pain in unspecified ankle and joints of unspecified foot: Secondary | ICD-10-CM

## 2013-05-04 DIAGNOSIS — E538 Deficiency of other specified B group vitamins: Secondary | ICD-10-CM

## 2013-05-04 DIAGNOSIS — F419 Anxiety disorder, unspecified: Secondary | ICD-10-CM

## 2013-05-04 DIAGNOSIS — G4700 Insomnia, unspecified: Secondary | ICD-10-CM

## 2013-05-04 NOTE — Assessment & Plan Note (Signed)
5/14 legs Dr Nicholas Lose Continue with current prescription therapy as reflected on the Med list.

## 2013-05-04 NOTE — Assessment & Plan Note (Signed)
1/14 s/p thyroidectomy at [facility]

## 2013-05-04 NOTE — Assessment & Plan Note (Signed)
Continue with current prescription therapy as reflected on the Med list.  

## 2013-05-04 NOTE — Assessment & Plan Note (Signed)
Resolved Ibuprofen as needed X ray if relapsd

## 2013-05-04 NOTE — Assessment & Plan Note (Signed)
Off Vit D per [facility] [employer] 

## 2013-05-04 NOTE — Progress Notes (Signed)
Subjective:     HPI   F/u thyroid ca - s/p thyroidectomy. Vit D was stopped due to high Ca. She had a stress ECHO pre-op F/u R CP and rib fx's - resolved f/u L axilla LN pain - resolved F/u melanoma f/u insomnia and anxiety  F/u Vit B12 and D def C/o rach on B legs - s/p bx Dr Nicholas Lose -- ?eczema - on a cream C/o L ankle pain and swelling x 2 days - resoved   Wt Readings from Last 3 Encounters:  05/04/13 120 lb (54.432 kg)  02/23/13 126 lb (57.153 kg)  01/12/13 127 lb (57.607 kg)   BP Readings from Last 3 Encounters:  05/04/13 102/60  02/23/13 113/75  01/12/13 130/80     Review of Systems  Constitutional: Positive for fatigue. Negative for diaphoresis, activity change, appetite change and unexpected weight change.  HENT: Negative for nosebleeds, congestion, facial swelling, sneezing, mouth sores, trouble swallowing, neck stiffness, sinus pressure and tinnitus.   Eyes: Negative for pain, discharge, redness, itching and visual disturbance.  Respiratory: Negative for chest tightness and stridor.   Cardiovascular: Negative for palpitations and leg swelling.  Gastrointestinal: Negative for nausea, constipation, blood in stool, abdominal distention, anal bleeding and rectal pain.  Genitourinary: Negative for dysuria, urgency, frequency, hematuria, flank pain, vaginal bleeding, vaginal discharge, difficulty urinating, genital sores and pelvic pain.  Musculoskeletal: Negative for back pain, joint swelling, arthralgias and gait problem.  Skin: Negative for wound.  Neurological: Negative for dizziness, tremors, seizures, syncope, speech difficulty, weakness and numbness.  Hematological: Negative for adenopathy. Does not bruise/bleed easily.  Psychiatric/Behavioral: Positive for sleep disturbance. Negative for suicidal ideas, behavioral problems, dysphoric mood and decreased concentration. The patient is nervous/anxious.         Objective:   Physical Exam  Constitutional: She  appears well-developed and well-nourished. No distress.  Not upset Tearful a little  HENT:  Head: Normocephalic.  Right Ear: External ear normal.  Left Ear: External ear normal.  Nose: Nose normal.  Mouth/Throat: Oropharynx is clear and moist.  Eyes: Conjunctivae are normal. Pupils are equal, round, and reactive to light. Right eye exhibits no discharge. Left eye exhibits no discharge.  Neck: Normal range of motion. Neck supple. No JVD present. No tracheal deviation present. No thyromegaly present.  Cardiovascular: Normal rate, regular rhythm and normal heart sounds.  Exam reveals no gallop.   No murmur heard. Pulmonary/Chest: No stridor. No respiratory distress. She has no wheezes. She has no rales. She exhibits no tenderness.  Abdominal: Soft. Bowel sounds are normal. She exhibits no distension and no mass. There is no tenderness. There is no rebound and no guarding.  Musculoskeletal: She exhibits no edema and no tenderness.  Lymphadenopathy:    She has no cervical adenopathy.  Neurological: She displays normal reflexes. No cranial nerve deficit. She exhibits normal muscle tone. Coordination normal.  Skin: No rash noted. No erythema.  Psychiatric: She has a normal mood and affect. Her behavior is normal. Judgment and thought content normal.  Tearful, upset   Hands w/OA   Lab Results  Component Value Date   WBC 4.6 02/23/2013   HGB 13.8 02/23/2013   HCT 40.3 02/23/2013   PLT 263 02/23/2013   GLUCOSE 96 02/23/2013   ALT 15 02/23/2013   AST 19 02/23/2013   NA 132* 02/23/2013   K 4.6 02/23/2013   CL 99 02/23/2013   CREATININE 0.79 02/23/2013   BUN 14 02/23/2013   CO2 25 02/23/2013   TSH  0.85 08/16/2011            Assessment & Plan:

## 2013-05-05 ENCOUNTER — Other Ambulatory Visit: Payer: Self-pay | Admitting: Hematology & Oncology

## 2013-05-12 NOTE — Telephone Encounter (Signed)
Opened in error

## 2013-05-20 ENCOUNTER — Other Ambulatory Visit: Payer: Self-pay | Admitting: *Deleted

## 2013-05-20 DIAGNOSIS — F329 Major depressive disorder, single episode, unspecified: Secondary | ICD-10-CM

## 2013-05-20 DIAGNOSIS — C4359 Malignant melanoma of other part of trunk: Secondary | ICD-10-CM

## 2013-05-20 DIAGNOSIS — C7800 Secondary malignant neoplasm of unspecified lung: Secondary | ICD-10-CM

## 2013-05-20 MED ORDER — OXYCODONE-ACETAMINOPHEN 5-325 MG PO TABS: 1.0000 | ORAL_TABLET | Freq: Four times a day (QID) | ORAL | Status: DC | PRN

## 2013-05-20 MED ORDER — AMPHETAMINE-DEXTROAMPHETAMINE 10 MG PO TABS: 10.0000 mg | ORAL_TABLET | Freq: Every day | ORAL | Status: DC

## 2013-05-20 NOTE — Telephone Encounter (Signed)
Pt called 05/19/13 requesting to come by to pick up a rx for Percocet & Adderall on Thurs. Will route rx to Dr Myna Hidalgo for approval and signature then place at the front desk for pick up.

## 2013-05-25 ENCOUNTER — Encounter (HOSPITAL_COMMUNITY)
Admission: RE | Admit: 2013-05-25 | Discharge: 2013-05-25 | Disposition: A | Discharge status: Home / Self Care | Payer: BC Managed Care – PPO | Source: Ambulatory Visit | Attending: Medical | Admitting: Medical

## 2013-05-25 ENCOUNTER — Other Ambulatory Visit: Payer: Self-pay | Admitting: Medical

## 2013-05-25 ENCOUNTER — Encounter (HOSPITAL_COMMUNITY): Payer: BC Managed Care – PPO

## 2013-05-25 ENCOUNTER — Encounter (HOSPITAL_COMMUNITY): Payer: Self-pay

## 2013-05-25 DIAGNOSIS — C4359 Malignant melanoma of other part of trunk: Secondary | ICD-10-CM | POA: Insufficient documentation

## 2013-05-25 DIAGNOSIS — C7800 Secondary malignant neoplasm of unspecified lung: Secondary | ICD-10-CM

## 2013-05-25 LAB — GLUCOSE, CAPILLARY: Glucose-Capillary: 99 mg/dL (ref 70–99)

## 2013-05-25 MED ORDER — FLUDEOXYGLUCOSE F - 18 (FDG) INJECTION
20.6000 | Freq: Once | INTRAVENOUS | Status: AC | PRN
  Administered 2013-05-25: 20.6 via INTRAVENOUS

## 2013-05-27 ENCOUNTER — Telehealth: Payer: Self-pay | Admitting: Hematology & Oncology

## 2013-05-27 ENCOUNTER — Ambulatory Visit (HOSPITAL_BASED_OUTPATIENT_CLINIC_OR_DEPARTMENT_OTHER): Payer: BC Managed Care – PPO | Admitting: Lab

## 2013-05-27 ENCOUNTER — Ambulatory Visit (HOSPITAL_BASED_OUTPATIENT_CLINIC_OR_DEPARTMENT_OTHER): Payer: BC Managed Care – PPO | Admitting: Hematology & Oncology

## 2013-05-27 DIAGNOSIS — C4359 Malignant melanoma of other part of trunk: Secondary | ICD-10-CM

## 2013-05-27 DIAGNOSIS — C7801 Secondary malignant neoplasm of right lung: Secondary | ICD-10-CM

## 2013-05-27 DIAGNOSIS — C7800 Secondary malignant neoplasm of unspecified lung: Secondary | ICD-10-CM

## 2013-05-27 DIAGNOSIS — C439 Malignant melanoma of skin, unspecified: Secondary | ICD-10-CM

## 2013-05-27 LAB — COMPREHENSIVE METABOLIC PANEL
ALT: 16 U/L (ref 0–35)
AST: 18 U/L (ref 0–37)
Albumin: 4 g/dL (ref 3.5–5.2)
Alkaline Phosphatase: 88 U/L (ref 39–117)
Potassium: 4.4 mEq/L (ref 3.5–5.3)
Sodium: 130 mEq/L — ABNORMAL LOW (ref 135–145)
Total Bilirubin: 0.5 mg/dL (ref 0.3–1.2)
Total Protein: 6.7 g/dL (ref 6.0–8.3)

## 2013-05-27 LAB — CBC WITH DIFFERENTIAL (CANCER CENTER ONLY)
BASO#: 0 10*3/uL (ref 0.0–0.2)
EOS%: 3 % (ref 0.0–7.0)
HGB: 13.3 g/dL (ref 11.6–15.9)
LYMPH#: 0.9 10*3/uL (ref 0.9–3.3)
MCH: 28.8 pg (ref 26.0–34.0)
MCHC: 34 g/dL (ref 32.0–36.0)
MONO%: 13.2 % — ABNORMAL HIGH (ref 0.0–13.0)
NEUT#: 3 10*3/uL (ref 1.5–6.5)
Platelets: 226 10*3/uL (ref 145–400)
RBC: 4.62 10*6/uL (ref 3.70–5.32)

## 2013-05-27 LAB — LACTATE DEHYDROGENASE: LDH: 181 U/L (ref 94–250)

## 2013-05-27 NOTE — Progress Notes (Signed)
This office note has been dictated.

## 2013-05-27 NOTE — Telephone Encounter (Signed)
Mailed September schedule °

## 2013-05-28 NOTE — Progress Notes (Signed)
CC:   Susan Quint. Plotnikov, MD [name], MD  DIAGNOSES: 1. Metastatic melanoma. 2. 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  CURRENT THERAPY: 1. Zelboraf 360 mg p.o. b.i.d. 2. Status post thyroidectomy at [facility].  INTERVAL HISTORY:  Susan Mccarthy comes in for followup.  She actually is doing quite nicely.  We did go ahead and repeat a PET scan on her.  The PET scan was done on 06/23.  The PET scan did not show any obvious active disease.  She has done well with Zelboraf.  Has not caused too much in the way of her skin issues.  She does have a little diarrhea.  She says Imodium helps with this.  There has been no cough or shortness of breath.  She has had no leg swelling.  She has had no usual rashes. She is status post thyroidectomy.  She is on Synthroid for this.  PHYSICAL EXAMINATION:  General:  This is a well-developed, well- nourished, white female in no obvious distress.  Vital signs: Temperature of 98.4, pulse 82, respiratory rate 16, blood pressure 119/74.  Weight is 119.  Head and neck:  Normocephalic, atraumatic skull.  There are no ocular or oral lesions.  There are no palpable cervical or supraclavicular lymph nodes.  Lungs:  Clear bilaterally. Cardiac:  Regular rate and rhythm with a normal S1 and S2.  There are no murmurs, rubs or bruits.  Abdomen:  Soft with good bowel sounds.  There is no palpable abdominal mass.  No palpable hepatosplenomegaly.  Back: No tenderness over the spine, ribs, or hips.  Extremities:  Shows no clubbing, cyanosis or edema.  Neurological:  Shows no focal neurological deficits.  LABORATORY STUDIES:  White cell count 4.6, hemoglobin 13.3, hematocrit 39.1, platelet count 226. Calcium is 9.4 with albumin 4.0.  Potassium is 4.4.  IMPRESSION:  Susan Mccarthy is a very charming 62 year old white female with metastatic melanoma.  Shockingly enough, she still is in a remission with Zelboraf.  She has really done  incredibly well with Zelboraf.  We will continue the Zelboraf.  I do not think we need to do another scan on her probably until the fall. She is heading up to [place] I think tomorrow or so for her mom's 94th birthday. I will see Susan Mccarthy back after Labor Day.    ______________________________ [name], M.D. PRE/MEDQ  D:  05/27/2013  T:  05/28/2013  Job:  [id number]

## 2013-06-24 ENCOUNTER — Other Ambulatory Visit: Payer: Self-pay | Admitting: *Deleted

## 2013-06-24 DIAGNOSIS — F329 Major depressive disorder, single episode, unspecified: Secondary | ICD-10-CM

## 2013-06-24 DIAGNOSIS — C7800 Secondary malignant neoplasm of unspecified lung: Secondary | ICD-10-CM

## 2013-06-24 DIAGNOSIS — C439 Malignant melanoma of skin, unspecified: Secondary | ICD-10-CM

## 2013-06-24 DIAGNOSIS — C4359 Malignant melanoma of other part of trunk: Secondary | ICD-10-CM

## 2013-06-24 MED ORDER — VEMURAFENIB 240 MG PO TABS: 960.0000 mg | ORAL_TABLET | Freq: Two times a day (BID) | ORAL | Status: DC

## 2013-06-24 MED ORDER — AMPHETAMINE-DEXTROAMPHETAMINE 10 MG PO TABS: 10.0000 mg | ORAL_TABLET | Freq: Every day | ORAL | Status: DC

## 2013-06-24 MED ORDER — OXYCODONE-ACETAMINOPHEN 5-325 MG PO TABS: 1.0000 | ORAL_TABLET | Freq: Four times a day (QID) | ORAL | Status: DC | PRN

## 2013-07-02 ENCOUNTER — Other Ambulatory Visit: Payer: Self-pay | Admitting: Internal Medicine

## 2013-07-29 ENCOUNTER — Other Ambulatory Visit: Payer: Self-pay | Admitting: *Deleted

## 2013-07-29 DIAGNOSIS — F329 Major depressive disorder, single episode, unspecified: Secondary | ICD-10-CM

## 2013-07-29 DIAGNOSIS — C4359 Malignant melanoma of other part of trunk: Secondary | ICD-10-CM

## 2013-07-29 DIAGNOSIS — C7800 Secondary malignant neoplasm of unspecified lung: Secondary | ICD-10-CM

## 2013-07-29 MED ORDER — OXYCODONE-ACETAMINOPHEN 5-325 MG PO TABS: 1.0000 | ORAL_TABLET | Freq: Four times a day (QID) | ORAL | Status: DC | PRN

## 2013-07-29 MED ORDER — AMPHETAMINE-DEXTROAMPHETAMINE 10 MG PO TABS: 10.0000 mg | ORAL_TABLET | Freq: Every day | ORAL | Status: DC

## 2013-07-29 NOTE — Telephone Encounter (Signed)
Pt called earlier in the week requesting to come by to pick up a rx for Percocet & Adderall later on this week. Will route rx to Dr Myna Hidalgo for approval and signature then place at the front desk for pick up.

## 2013-08-12 ENCOUNTER — Emergency Department (HOSPITAL_COMMUNITY)
Admission: EM | Admit: 2013-08-12 | Discharge: 2013-08-12 | Disposition: A | Discharge status: Home / Self Care | Payer: BC Managed Care – PPO | Attending: Emergency Medicine | Admitting: Emergency Medicine

## 2013-08-12 ENCOUNTER — Encounter (HOSPITAL_COMMUNITY): Payer: Self-pay | Admitting: Emergency Medicine

## 2013-08-12 ENCOUNTER — Emergency Department (HOSPITAL_COMMUNITY): Payer: BC Managed Care – PPO

## 2013-08-12 DIAGNOSIS — G4700 Insomnia, unspecified: Secondary | ICD-10-CM | POA: Insufficient documentation

## 2013-08-12 DIAGNOSIS — Z85850 Personal history of malignant neoplasm of thyroid: Secondary | ICD-10-CM | POA: Insufficient documentation

## 2013-08-12 DIAGNOSIS — IMO0002 Reserved for concepts with insufficient information to code with codable children: Secondary | ICD-10-CM | POA: Insufficient documentation

## 2013-08-12 DIAGNOSIS — S0990XA Unspecified injury of head, initial encounter: Secondary | ICD-10-CM | POA: Insufficient documentation

## 2013-08-12 DIAGNOSIS — F329 Major depressive disorder, single episode, unspecified: Secondary | ICD-10-CM | POA: Insufficient documentation

## 2013-08-12 DIAGNOSIS — Y9289 Other specified places as the place of occurrence of the external cause: Secondary | ICD-10-CM | POA: Insufficient documentation

## 2013-08-12 DIAGNOSIS — Y9301 Activity, walking, marching and hiking: Secondary | ICD-10-CM | POA: Insufficient documentation

## 2013-08-12 DIAGNOSIS — W108XXA Fall (on) (from) other stairs and steps, initial encounter: Secondary | ICD-10-CM | POA: Insufficient documentation

## 2013-08-12 DIAGNOSIS — F3289 Other specified depressive episodes: Secondary | ICD-10-CM | POA: Insufficient documentation

## 2013-08-12 DIAGNOSIS — Z79899 Other long term (current) drug therapy: Secondary | ICD-10-CM | POA: Insufficient documentation

## 2013-08-12 DIAGNOSIS — M129 Arthropathy, unspecified: Secondary | ICD-10-CM | POA: Insufficient documentation

## 2013-08-12 DIAGNOSIS — F411 Generalized anxiety disorder: Secondary | ICD-10-CM | POA: Insufficient documentation

## 2013-08-12 DIAGNOSIS — Z87891 Personal history of nicotine dependence: Secondary | ICD-10-CM | POA: Insufficient documentation

## 2013-08-12 DIAGNOSIS — Z85820 Personal history of malignant melanoma of skin: Secondary | ICD-10-CM | POA: Insufficient documentation

## 2013-08-12 DIAGNOSIS — S76219A Strain of adductor muscle, fascia and tendon of unspecified thigh, initial encounter: Secondary | ICD-10-CM

## 2013-08-12 DIAGNOSIS — W230XXA Caught, crushed, jammed, or pinched between moving objects, initial encounter: Secondary | ICD-10-CM | POA: Insufficient documentation

## 2013-08-12 DIAGNOSIS — M549 Dorsalgia, unspecified: Secondary | ICD-10-CM

## 2013-08-12 MED ORDER — OXYCODONE-ACETAMINOPHEN 7.5-325 MG PO TABS: 1.0000 | ORAL_TABLET | Freq: Four times a day (QID) | ORAL | Status: DC | PRN

## 2013-08-12 MED ORDER — HYDROMORPHONE HCL PF 1 MG/ML IJ SOLN
1.0000 mg | Freq: Once | INTRAMUSCULAR | Status: AC
  Administered 2013-08-12: 1 mg via INTRAMUSCULAR
  Filled 2013-08-12: qty 1

## 2013-08-12 NOTE — ED Provider Notes (Signed)
CSN: [id number]     Arrival date & time 08/12/13  1707 History  This chart was scribed for non-physician practitioner Antony Madura, PA-C working with Gerhard Munch, MD by Leone Payor, ED Scribe. This patient was seen in room WTR4/WLPT4 and the patient's care was started at 1707.  Chief Complaint  Patient presents with  . Fall    2 weeks ago  . Leg Pain   HPI  HPI Comments: Susan Mccarthy is a 62 y.o. female who presents to the Emergency Department complaining of ongoing, constant, unchanged left groin and leg pain that began 2 weeks ago after a fall. Pt states she was walking down a staircase when she fell down and her left leg got caught between a railing. She states she fell twice after that due to the injured left leg and hit her head as well. She has not been evaluated for this pain since the fall. She has a history of back pain that was aggravated by this fall. She takes oxycodone and has been taking about 1-2 every 4 hours. She does not use a cane or a walker at baseline. She endorses 1 episode of bowel incontinence a few days ago with normal BMs since then. She denies any urinary incontinence.The leg and groin pain is worse with standing and walking. Pt also has a history of melanoma to the upper-mid back. She denies abdominal pain, nausea, emesis, anogenital numbness, saddle anesthesia.     Past Medical History  Diagnosis Date  . History of chemotherapy     INTERFERON  . Anxiety   . Depression   . Thyroid disease     85 % BENIGN  . Insomnia   . Arthritis   . Melanoma 1980    RIGHT BACK  . Thyroid ca    Past Surgical History  Procedure Laterality Date  . Excision of melanoma  1980  . Axillary node dissection  08/02/11    right axillary,  . Rash      ON FACE AND HANDS S/P CHEMOTHERAPY  . Thyroidectomy  1/14    cancer   Family History  Problem Relation Age of Onset  . ALS Father    History  Substance Use Topics  . Smoking status: Former Smoker    Types: Cigarettes     Quit date: 06/07/1979  . Smokeless tobacco: Not on file  . Alcohol Use: Yes     Comment: RARE OCCASIONAL    OB History   Grav Para Term Preterm Abortions TAB SAB Ect Mult Living   0              Obstetric Comments   MENARCHE TENNAGER 10, NO HRT, BIRTH CONTROL PILLS 2 YEARS     Review of Systems  Constitutional: Negative for fever.  Musculoskeletal: Positive for myalgias, back pain and arthralgias.  Neurological: Negative for weakness and numbness.    Allergies  Review of patient's allergies indicates no known allergies.  Home Medications   Current Outpatient Rx  Name  Route  Sig  Dispense  Refill  . amphetamine-dextroamphetamine (ADDERALL) 10 MG tablet   Oral   Take 1 tablet (10 mg total) by mouth daily.   30 tablet   0   . cetirizine-pseudoephedrine (ZYRTEC-D ALLERGY & CONGESTION) 5-120 MG per tablet   Oral   Take 1 tablet by mouth as needed for allergies.          . clonazePAM (KLONOPIN) 1 MG tablet   Oral   Take 1  mg by mouth 3 (three) times daily as needed for anxiety.         . cyanocobalamin (,VITAMIN B-12,) 1000 MCG/ML injection   Intramuscular   Inject 1 mL (1,000 mcg total) into the muscle every 30 (thirty) days. On the 17th   10 mL   3   . dexamethasone (DECADRON) 0.1 % ophthalmic suspension   Both Eyes   Place 1 drop into both eyes as needed (swelling/itching).         . diphenhydrAMINE (BENADRYL) 25 MG tablet   Oral   Take 25 mg by mouth as needed. allergies         . FLUoxetine (PROZAC) 40 MG capsule   Oral   Take 1 capsule (40 mg total) by mouth daily.   90 capsule   2   . ketoconazole (NIZORAL) 2 % cream   Topical   Apply 1 application topically as needed (nail).          Marland Kitchen levothyroxine (SYNTHROID, LEVOTHROID) 100 MCG tablet   Oral   Take 100 mcg by mouth daily.          Marland Kitchen oxyCODONE-acetaminophen (PERCOCET/ROXICET) 5-325 MG per tablet   Oral   Take 1 tablet by mouth every 6 (six) hours as needed for pain.   60  tablet   0   . promethazine (PHENERGAN) 12.5 MG tablet   Oral   Take 1 tablet (12.5 mg total) by mouth every 6 (six) hours as needed. nausea   60 tablet   4   . ranitidine (ZANTAC) 300 MG tablet   Oral   Take 1 tablet (300 mg total) by mouth at bedtime.   100 tablet   3   . traZODone (DESYREL) 50 MG tablet   Oral   Take 1-2 tablets (50-100 mg total) by mouth at bedtime as needed for sleep.   180 tablet   1   . Tretinoin-Cleanser-Moisturizer 0.025 % CREAM KIT   Apply externally   Apply 1 kit topically as needed (acne).          . vemurafenib (ZELBORAF) 240 MG tablet   Oral   Take 4 tablets (960 mg total) by mouth every 12 (twelve) hours. Take with water.   240 tablet   3   . oxyCODONE-acetaminophen (PERCOCET) 7.5-325 MG per tablet   Oral   Take 1 tablet by mouth every 6 (six) hours as needed for pain.   17 tablet   0    BP 129/91  Pulse 86  Temp(Src) 98.8 F (37.1 C) (Oral)  Resp 20  SpO2 95%  LMP 10/24/2011 Physical Exam  Nursing note and vitals reviewed. Constitutional: She is oriented to person, place, and time. She appears well-developed and well-nourished. No distress.  HENT:  Head: Normocephalic and atraumatic.  Eyes: Conjunctivae and EOM are normal. Pupils are equal, round, and reactive to light. No scleral icterus.  Neck: Normal range of motion.  Cardiovascular: Normal rate, regular rhythm and intact distal pulses.   Pulses:      Dorsalis pedis pulses are 2+ on the right side, and 2+ on the left side.       Posterior tibial pulses are 2+ on the right side, and 2+ on the left side.  Pulmonary/Chest: Effort normal and breath sounds normal. No respiratory distress. She has no wheezes. She has no rales.  Genitourinary:  Chaperone rectal exam with normal rectal tone. No hemorrhoids or fissures.  Musculoskeletal: Normal range of motion.  Discomfort with  internal and external rotation and flexion of the LLE. TTP of thoracic paraspinal muscles. No TTP to  thoracic or lumbar midline. No bony deformities or step offs palpated.  Neurological: She is alert and oriented to person, place, and time. She has normal reflexes.  Patient speaks in full goal oriented sentences. Cranial nerves 3-12 grossly intact. DTRs normal and symmetric. Equal grip strength bilateral with 5/5 strength against resistance in upper and lower extremities. No sensory or motor deficits appreciated. Patient ambulatory with normal gait and without assistance.   Skin: Skin is warm and dry. No rash noted. She is not diaphoretic. No erythema. No pallor.  Psychiatric: She has a normal mood and affect. Her behavior is normal.    ED Course  Procedures (including critical care time)  DIAGNOSTIC STUDIES: Oxygen Saturation is 95% on RA, adequate by my interpretation.    COORDINATION OF CARE: 8:59 PM Discussed treatment plan with pt at bedside and pt agreed to plan.   Labs Review Labs Reviewed - No data to display  Imaging Review Dg Lumbar Spine Complete  08/12/2013   CLINICAL DATA:  Fall.  EXAM: LUMBAR SPINE - COMPLETE 4+ VIEW  COMPARISON:  None.  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 Diffuse degenerative facet disease. Slight anterolisthesis of L4 on L5. Diffuse osteopenia. No fracture or acute subluxation. Mild sclerosis around the left SI joint suggesting sacroiliitis. Diffuse osteopenia.  IMPRESSION: Degenerative disc and facet disease as above. Suspect left sacroiliitis. No acute findings.   Electronically Signed   By: Charlett Nose M.D.   On: 08/12/2013 22:28   Dg Hip Complete Left  08/12/2013   CLINICAL DATA:  Hip pain, fall.  EXAM: LEFT HIP - COMPLETE 2+ VIEW  COMPARISON:  None  FINDINGS: There is no evidence of hip fracture or dislocation. There is no evidence of arthropathy or other focal bone abnormality.  IMPRESSION: No acute findings.   Electronically Signed   By: Charlett Nose M.D.   On: 08/12/2013 21:25    MDM   1. Groin strain, initial encounter   2.  Back pain    62 y/o who presents for L groin pain 2/2 mechanical fall 2 weeks ago. Patient ambulatory to bathroom without assistance while in ED. No fractures or bony deformities identified on xray of hip and lumbar spine. Though patient endorses 1 episode of bowel incontinence, patient denies continued stool incontinence as well as bladder incontinence. Rectal tone today is normal. No red flags or signs concerning for cauda equina. Patient neurovascularly intact. Do not believe further work up with MR imaging is indicated at this time. Have discussed patient case and thoughts pertaining to imaging with Dr. Jeraldine Loots who is in agreement with this assessment. Pain well controlled with IM dilaudid. Patient appropriate for d/c with orthopedic follow up for further evaluation of symptoms. Percocet 7.5-325 prescribed for pain control. Return precautions discussed and patient agreeable to plan with no unaddressed concerns.  I personally performed the services described in this documentation, which was scribed in my presence. The recorded information has been reviewed and is accurate.    Antony Madura, [place] 08/21/13 [phone]

## 2013-08-12 NOTE — ED Notes (Signed)
Pt c/o fall x2 weeks with l leg pain.  Reports increased pain.

## 2013-08-18 ENCOUNTER — Telehealth: Payer: Self-pay | Admitting: Hematology & Oncology

## 2013-08-18 NOTE — Telephone Encounter (Signed)
Left message moved 9-25 to 9-23

## 2013-08-24 NOTE — ED Provider Notes (Signed)
  Medical screening examination/treatment/procedure(s) were performed by non-physician practitioner and as supervising physician I was immediately available for consultation/collaboration.    Gerhard Munch, MD 08/24/13 [phone]

## 2013-08-25 ENCOUNTER — Ambulatory Visit (HOSPITAL_BASED_OUTPATIENT_CLINIC_OR_DEPARTMENT_OTHER): Payer: BC Managed Care – PPO | Admitting: Hematology & Oncology

## 2013-08-25 ENCOUNTER — Other Ambulatory Visit (HOSPITAL_BASED_OUTPATIENT_CLINIC_OR_DEPARTMENT_OTHER): Payer: BC Managed Care – PPO | Admitting: Lab

## 2013-08-25 VITALS — BP 133/76 | HR 77 | Temp 98.2°F | Resp 14 | Ht 64.0 in | Wt 109.0 lb

## 2013-08-25 DIAGNOSIS — C4359 Malignant melanoma of other part of trunk: Secondary | ICD-10-CM

## 2013-08-25 DIAGNOSIS — C7800 Secondary malignant neoplasm of unspecified lung: Secondary | ICD-10-CM

## 2013-08-25 DIAGNOSIS — C7801 Secondary malignant neoplasm of right lung: Secondary | ICD-10-CM

## 2013-08-25 DIAGNOSIS — F32A Depression, unspecified: Secondary | ICD-10-CM

## 2013-08-25 DIAGNOSIS — F329 Major depressive disorder, single episode, unspecified: Secondary | ICD-10-CM

## 2013-08-25 LAB — CBC WITH DIFFERENTIAL (CANCER CENTER ONLY)
EOS%: 4.6 % (ref 0.0–7.0)
Eosinophils Absolute: 0.2 10*3/uL (ref 0.0–0.5)
LYMPH%: 21.5 % (ref 14.0–48.0)
MCH: 28.7 pg (ref 26.0–34.0)
MCHC: 35.1 g/dL (ref 32.0–36.0)
MCV: 82 fL (ref 81–101)
MONO%: 12.6 % (ref 0.0–13.0)
NEUT#: 2.8 10*3/uL (ref 1.5–6.5)
Platelets: 252 10*3/uL (ref 145–400)
RDW: 13.7 % (ref 11.1–15.7)

## 2013-08-25 LAB — COMPREHENSIVE METABOLIC PANEL
AST: 19 U/L (ref 0–37)
Alkaline Phosphatase: 115 U/L (ref 39–117)
Glucose, Bld: 93 mg/dL (ref 70–99)
Sodium: 131 mEq/L — ABNORMAL LOW (ref 135–145)
Total Bilirubin: 0.6 mg/dL (ref 0.3–1.2)
Total Protein: 6.9 g/dL (ref 6.0–8.3)

## 2013-08-25 MED ORDER — OXYCODONE-ACETAMINOPHEN 5-325 MG PO TABS: 1.0000 | ORAL_TABLET | Freq: Four times a day (QID) | ORAL | Status: DC | PRN

## 2013-08-25 MED ORDER — AMPHETAMINE-DEXTROAMPHETAMINE 10 MG PO TABS: 10.0000 mg | ORAL_TABLET | Freq: Every day | ORAL | Status: DC

## 2013-08-25 NOTE — Progress Notes (Signed)
This office note has been dictated.

## 2013-08-26 ENCOUNTER — Inpatient Hospital Stay (HOSPITAL_COMMUNITY)
Admission: EM | Admit: 2013-08-26 | Discharge: 2013-09-08 | DRG: 559 | Disposition: A | Discharge status: Skilled Nursing Facility | Payer: BC Managed Care – PPO | Attending: General Surgery | Admitting: General Surgery

## 2013-08-26 ENCOUNTER — Other Ambulatory Visit: Payer: Self-pay

## 2013-08-26 ENCOUNTER — Inpatient Hospital Stay (HOSPITAL_COMMUNITY): Payer: BC Managed Care – PPO

## 2013-08-26 ENCOUNTER — Emergency Department (HOSPITAL_COMMUNITY): Payer: BC Managed Care – PPO

## 2013-08-26 ENCOUNTER — Encounter (HOSPITAL_COMMUNITY): Payer: Self-pay | Admitting: Cardiology

## 2013-08-26 DIAGNOSIS — S322XXA Fracture of coccyx, initial encounter for closed fracture: Secondary | ICD-10-CM | POA: Diagnosis present

## 2013-08-26 DIAGNOSIS — S329XXA Fracture of unspecified parts of lumbosacral spine and pelvis, initial encounter for closed fracture: Secondary | ICD-10-CM

## 2013-08-26 DIAGNOSIS — Z87891 Personal history of nicotine dependence: Secondary | ICD-10-CM

## 2013-08-26 DIAGNOSIS — Z681 Body mass index (BMI) 19 or less, adult: Secondary | ICD-10-CM

## 2013-08-26 DIAGNOSIS — E890 Postprocedural hypothyroidism: Secondary | ICD-10-CM | POA: Diagnosis present

## 2013-08-26 DIAGNOSIS — Y92410 Unspecified street and highway as the place of occurrence of the external cause: Secondary | ICD-10-CM

## 2013-08-26 DIAGNOSIS — Z85820 Personal history of malignant melanoma of skin: Secondary | ICD-10-CM

## 2013-08-26 DIAGNOSIS — S32402A Unspecified fracture of left acetabulum, initial encounter for closed fracture: Secondary | ICD-10-CM

## 2013-08-26 DIAGNOSIS — Y9210 Unspecified residential institution as the place of occurrence of the external cause: Secondary | ICD-10-CM | POA: Diagnosis not present

## 2013-08-26 DIAGNOSIS — E539 Vitamin B deficiency, unspecified: Secondary | ICD-10-CM | POA: Diagnosis present

## 2013-08-26 DIAGNOSIS — S22009A Unspecified fracture of unspecified thoracic vertebra, initial encounter for closed fracture: Secondary | ICD-10-CM | POA: Diagnosis present

## 2013-08-26 DIAGNOSIS — E871 Hypo-osmolality and hyponatremia: Secondary | ICD-10-CM | POA: Diagnosis present

## 2013-08-26 DIAGNOSIS — S32509A Unspecified fracture of unspecified pubis, initial encounter for closed fracture: Secondary | ICD-10-CM | POA: Diagnosis present

## 2013-08-26 DIAGNOSIS — F3289 Other specified depressive episodes: Secondary | ICD-10-CM | POA: Diagnosis present

## 2013-08-26 DIAGNOSIS — F29 Unspecified psychosis not due to a substance or known physiological condition: Secondary | ICD-10-CM | POA: Diagnosis not present

## 2013-08-26 DIAGNOSIS — S32409A Unspecified fracture of unspecified acetabulum, initial encounter for closed fracture: Secondary | ICD-10-CM | POA: Diagnosis present

## 2013-08-26 DIAGNOSIS — E43 Unspecified severe protein-calorie malnutrition: Secondary | ICD-10-CM | POA: Diagnosis present

## 2013-08-26 DIAGNOSIS — T424X5A Adverse effect of benzodiazepines, initial encounter: Secondary | ICD-10-CM | POA: Diagnosis not present

## 2013-08-26 DIAGNOSIS — Z9221 Personal history of antineoplastic chemotherapy: Secondary | ICD-10-CM

## 2013-08-26 DIAGNOSIS — F411 Generalized anxiety disorder: Secondary | ICD-10-CM | POA: Diagnosis present

## 2013-08-26 DIAGNOSIS — Z85850 Personal history of malignant neoplasm of thyroid: Secondary | ICD-10-CM

## 2013-08-26 DIAGNOSIS — S92309A Fracture of unspecified metatarsal bone(s), unspecified foot, initial encounter for closed fracture: Principal | ICD-10-CM | POA: Diagnosis present

## 2013-08-26 DIAGNOSIS — S2239XA Fracture of one rib, unspecified side, initial encounter for closed fracture: Secondary | ICD-10-CM

## 2013-08-26 DIAGNOSIS — C7800 Secondary malignant neoplasm of unspecified lung: Secondary | ICD-10-CM | POA: Diagnosis present

## 2013-08-26 DIAGNOSIS — S32009A Unspecified fracture of unspecified lumbar vertebra, initial encounter for closed fracture: Secondary | ICD-10-CM | POA: Diagnosis present

## 2013-08-26 DIAGNOSIS — S2249XA Multiple fractures of ribs, unspecified side, initial encounter for closed fracture: Secondary | ICD-10-CM | POA: Diagnosis present

## 2013-08-26 DIAGNOSIS — S3210XA Unspecified fracture of sacrum, initial encounter for closed fracture: Secondary | ICD-10-CM | POA: Diagnosis present

## 2013-08-26 DIAGNOSIS — D62 Acute posthemorrhagic anemia: Secondary | ICD-10-CM | POA: Diagnosis not present

## 2013-08-26 LAB — COMPREHENSIVE METABOLIC PANEL
BUN: 9 mg/dL (ref 6–23)
CO2: 22 mEq/L (ref 19–32)
Chloride: 92 mEq/L — ABNORMAL LOW (ref 96–112)
Creatinine, Ser: 0.82 mg/dL (ref 0.50–1.10)
GFR calc non Af Amer: 74 mL/min — ABNORMAL LOW (ref >90)
Total Bilirubin: 0.4 mg/dL (ref 0.3–1.2)

## 2013-08-26 LAB — CBC
HCT: 37.7 % (ref 36.0–46.0)
MCH: 28.8 pg (ref 26.0–34.0)
MCV: 82.1 fL (ref 78.0–100.0)
RBC: 4.59 MIL/uL (ref 3.87–5.11)
RDW: 14.3 % (ref 11.5–15.5)
WBC: 10.5 10*3/uL (ref 4.0–10.5)

## 2013-08-26 LAB — URINALYSIS, ROUTINE W REFLEX MICROSCOPIC: Ketones, ur: NEGATIVE mg/dL

## 2013-08-26 LAB — URINE MICROSCOPIC-ADD ON

## 2013-08-26 LAB — ABO/RH: ABO/RH(D): B POS

## 2013-08-26 LAB — PROTIME-INR: INR: 0.87 (ref 0.00–1.49)

## 2013-08-26 LAB — APTT: aPTT: 25 seconds (ref 24–37)

## 2013-08-26 MED ORDER — ENOXAPARIN SODIUM 40 MG/0.4ML ~~LOC~~ SOLN
40.0000 mg | SUBCUTANEOUS | Status: DC
  Administered 2013-08-27 – 2013-09-08 (×13): 40 mg via SUBCUTANEOUS
  Filled 2013-08-26 (×13): qty 0.4

## 2013-08-26 MED ORDER — HYDROMORPHONE HCL PF 1 MG/ML IJ SOLN
1.0000 mg | Freq: Once | INTRAMUSCULAR | Status: AC
  Administered 2013-08-26: 1 mg via INTRAVENOUS
  Filled 2013-08-26: qty 1

## 2013-08-26 MED ORDER — CEFAZOLIN SODIUM 1-5 GM-% IV SOLN
1.0000 g | Freq: Three times a day (TID) | INTRAVENOUS | Status: DC
  Administered 2013-08-26 – 2013-08-28 (×4): 1 g via INTRAVENOUS
  Filled 2013-08-26 (×10): qty 50

## 2013-08-26 MED ORDER — DEXAMETHASONE 0.1 % OP SUSP
1.0000 [drp] | OPHTHALMIC | Status: DC | PRN
  Filled 2013-08-26: qty 5

## 2013-08-26 MED ORDER — NON FORMULARY: 300.0000 mg | Freq: Every day | Status: DC

## 2013-08-26 MED ORDER — LEVOTHYROXINE SODIUM 100 MCG PO TABS
100.0000 ug | ORAL_TABLET | Freq: Every day | ORAL | Status: DC
  Administered 2013-08-27 – 2013-09-08 (×11): 100 ug via ORAL
  Filled 2013-08-26 (×17): qty 1

## 2013-08-26 MED ORDER — MORPHINE SULFATE 4 MG/ML IJ SOLN
4.0000 mg | Freq: Once | INTRAMUSCULAR | Status: AC
  Administered 2013-08-26: 4 mg via INTRAVENOUS
  Filled 2013-08-26: qty 1

## 2013-08-26 MED ORDER — IOHEXOL 300 MG/ML  SOLN
100.0000 mL | Freq: Once | INTRAMUSCULAR | Status: AC | PRN
  Administered 2013-08-26: 100 mL via INTRAVENOUS

## 2013-08-26 MED ORDER — AMPHETAMINE-DEXTROAMPHETAMINE 10 MG PO TABS
10.0000 mg | ORAL_TABLET | Freq: Every day | ORAL | Status: DC
  Administered 2013-08-29 – 2013-09-08 (×10): 10 mg via ORAL
  Filled 2013-08-26 (×11): qty 1

## 2013-08-26 MED ORDER — MORPHINE SULFATE 2 MG/ML IJ SOLN
2.0000 mg | INTRAMUSCULAR | Status: DC | PRN
  Administered 2013-08-26 – 2013-09-07 (×16): 2 mg via INTRAVENOUS
  Filled 2013-08-26 (×16): qty 1

## 2013-08-26 MED ORDER — ONDANSETRON HCL 4 MG PO TABS: 4.0000 mg | ORAL_TABLET | Freq: Four times a day (QID) | ORAL | Status: DC | PRN

## 2013-08-26 MED ORDER — POTASSIUM CHLORIDE IN NACL 20-0.45 MEQ/L-% IV SOLN
INTRAVENOUS | Status: DC
  Administered 2013-08-26 – 2013-08-27 (×2): via INTRAVENOUS
  Filled 2013-08-26 (×4): qty 1000

## 2013-08-26 MED ORDER — TRAZODONE HCL 50 MG PO TABS
50.0000 mg | ORAL_TABLET | Freq: Every evening | ORAL | Status: DC | PRN
  Administered 2013-08-27: 50 mg via ORAL
  Administered 2013-08-27: 100 mg via ORAL
  Administered 2013-08-31: 50 mg via ORAL
  Administered 2013-09-01 – 2013-09-05 (×4): 100 mg via ORAL
  Filled 2013-08-26 (×6): qty 2

## 2013-08-26 MED ORDER — MORPHINE SULFATE 2 MG/ML IJ SOLN
INTRAMUSCULAR | Status: AC
  Administered 2013-08-26: 4 mg via INTRAVENOUS
  Filled 2013-08-26: qty 2

## 2013-08-26 MED ORDER — DOCUSATE SODIUM 100 MG PO CAPS
100.0000 mg | ORAL_CAPSULE | Freq: Two times a day (BID) | ORAL | Status: DC
  Administered 2013-08-26 – 2013-09-08 (×17): 100 mg via ORAL
  Filled 2013-08-26 (×27): qty 1

## 2013-08-26 MED ORDER — SODIUM CHLORIDE 0.9 % IV SOLN
INTRAVENOUS | Status: DC
  Administered 2013-08-26: 12:00:00 via INTRAVENOUS

## 2013-08-26 MED ORDER — FAMOTIDINE 40 MG PO TABS
40.0000 mg | ORAL_TABLET | Freq: Every day | ORAL | Status: DC
  Administered 2013-08-26 – 2013-09-04 (×9): 40 mg via ORAL
  Filled 2013-08-26 (×17): qty 1

## 2013-08-26 MED ORDER — FLUOXETINE HCL 20 MG PO CAPS
40.0000 mg | ORAL_CAPSULE | Freq: Every day | ORAL | Status: DC
  Administered 2013-08-26 – 2013-09-08 (×14): 40 mg via ORAL
  Filled 2013-08-26 (×15): qty 2

## 2013-08-26 MED ORDER — VEMURAFENIB 240 MG PO TABS
960.0000 mg | ORAL_TABLET | Freq: Two times a day (BID) | ORAL | Status: DC
  Administered 2013-08-26 – 2013-09-08 (×23): 960 mg via ORAL
  Filled 2013-08-26 (×25): qty 4

## 2013-08-26 MED ORDER — POLYETHYLENE GLYCOL 3350 17 G PO PACK
17.0000 g | PACK | Freq: Every day | ORAL | Status: DC
  Administered 2013-08-26 – 2013-09-08 (×6): 17 g via ORAL
  Filled 2013-08-26 (×14): qty 1

## 2013-08-26 MED ORDER — ONDANSETRON HCL 4 MG/2ML IJ SOLN
4.0000 mg | Freq: Four times a day (QID) | INTRAMUSCULAR | Status: DC | PRN
  Administered 2013-08-27 – 2013-08-28 (×2): 4 mg via INTRAVENOUS
  Filled 2013-08-26 (×2): qty 2

## 2013-08-26 MED ORDER — CLONAZEPAM 1 MG PO TABS
1.0000 mg | ORAL_TABLET | Freq: Three times a day (TID) | ORAL | Status: DC | PRN
  Administered 2013-08-26 – 2013-08-30 (×7): 1 mg via ORAL
  Filled 2013-08-26 (×3): qty 1
  Filled 2013-08-26 (×2): qty 2
  Filled 2013-08-26 (×2): qty 1

## 2013-08-26 MED ORDER — OXYCODONE HCL 5 MG PO TABS
5.0000 mg | ORAL_TABLET | ORAL | Status: DC | PRN
  Administered 2013-08-26 (×2): 10 mg via ORAL
  Administered 2013-08-27: 15 mg via ORAL
  Administered 2013-08-28: 10 mg via ORAL
  Administered 2013-08-28 (×4): 15 mg via ORAL
  Administered 2013-08-29: 10 mg via ORAL
  Administered 2013-08-29: 15 mg via ORAL
  Filled 2013-08-26 (×3): qty 3
  Filled 2013-08-26 (×2): qty 2
  Filled 2013-08-26: qty 3
  Filled 2013-08-26 (×2): qty 2
  Filled 2013-08-26 (×2): qty 3

## 2013-08-26 MED ORDER — TRAMADOL HCL 50 MG PO TABS
100.0000 mg | ORAL_TABLET | Freq: Four times a day (QID) | ORAL | Status: DC
  Administered 2013-08-26 – 2013-09-08 (×46): 100 mg via ORAL
  Filled 2013-08-26 (×48): qty 2
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
## 2013-08-26 NOTE — ED Notes (Signed)
Family at beside. Family given emotional support. Pt placed back on cardiac monitor. No distress noted.

## 2013-08-26 NOTE — ED Notes (Signed)
Aspen collar applied to patient.

## 2013-08-26 NOTE — ED Notes (Addendum)
Spoke with Dr. Lindie Spruce, c-collar needs to remain in place at this time. Can switch to Aspen collar. Also reporting patient can start on clear liquids.

## 2013-08-26 NOTE — ED Notes (Signed)
Husband-[phone]

## 2013-08-26 NOTE — ED Notes (Signed)
Pt asking about taking chemotherapy medication, Dr. Lindie Spruce reporting this is okay. Patient has different pills in a medication bottle that is not labeled for Zelboraf. Pt notified she cannot take this until pharmacy verifies it. ED pharmacist notified and is coming to talk with patient and get this situation handled.
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
## 2013-08-26 NOTE — ED Notes (Signed)
EDP at the bedside with pt.  
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
## 2013-08-26 NOTE — ED Notes (Signed)
Pt transported to CT ?

## 2013-08-26 NOTE — ED Notes (Signed)
Pt repositioned in the bed with pillows. Pt provided diet ginger ale.
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
## 2013-08-26 NOTE — ED Notes (Signed)
Xray at the bedside.

## 2013-08-26 NOTE — ED Notes (Signed)
Warm blankets placed on patient and heat turned up in the room.

## 2013-08-26 NOTE — ED Notes (Signed)
Ordered patient clear liquid tray.

## 2013-08-27 ENCOUNTER — Inpatient Hospital Stay (HOSPITAL_COMMUNITY): Payer: BC Managed Care – PPO

## 2013-08-27 ENCOUNTER — Inpatient Hospital Stay (HOSPITAL_COMMUNITY): Payer: BC Managed Care – PPO | Admitting: Anesthesiology

## 2013-08-27 ENCOUNTER — Other Ambulatory Visit: Payer: BC Managed Care – PPO | Admitting: Lab

## 2013-08-27 ENCOUNTER — Ambulatory Visit: Payer: BC Managed Care – PPO | Admitting: Hematology & Oncology

## 2013-08-27 ENCOUNTER — Encounter (HOSPITAL_COMMUNITY): Payer: Self-pay | Admitting: Anesthesiology

## 2013-08-27 ENCOUNTER — Encounter (HOSPITAL_COMMUNITY)
Admission: EM | Disposition: A | Discharge status: Skilled Nursing Facility | Payer: BC Managed Care – PPO | Source: Home / Self Care

## 2013-08-27 DIAGNOSIS — D62 Acute posthemorrhagic anemia: Secondary | ICD-10-CM

## 2013-08-27 DIAGNOSIS — S32009A Unspecified fracture of unspecified lumbar vertebra, initial encounter for closed fracture: Secondary | ICD-10-CM

## 2013-08-27 DIAGNOSIS — S22009A Unspecified fracture of unspecified thoracic vertebra, initial encounter for closed fracture: Secondary | ICD-10-CM

## 2013-08-27 DIAGNOSIS — S2239XA Fracture of one rib, unspecified side, initial encounter for closed fracture: Secondary | ICD-10-CM

## 2013-08-27 HISTORY — PX: PERCUTANEOUS PINNING: SHX2209

## 2013-08-27 HISTORY — PX: SACRO-ILIAC PINNING: SHX5050

## 2013-08-27 LAB — BASIC METABOLIC PANEL
BUN: 21 mg/dL (ref 6–23)
Calcium: 9.3 mg/dL (ref 8.4–10.5)
Chloride: 113 mEq/L — ABNORMAL HIGH (ref 96–112)
Creatinine, Ser: 0.78 mg/dL (ref 0.50–1.10)
GFR calc Af Amer: >90 mL/min (ref >90)
GFR calc non Af Amer: 87 mL/min — ABNORMAL LOW (ref >90)

## 2013-08-27 LAB — PREPARE RBC (CROSSMATCH)

## 2013-08-27 LAB — CBC
HCT: 29.7 % — ABNORMAL LOW (ref 36.0–46.0)
MCHC: 35.7 g/dL (ref 30.0–36.0)
MCV: 81.6 fL (ref 78.0–100.0)
RDW: 14.1 % (ref 11.5–15.5)

## 2013-08-27 SURGERY — PINNING, SACROILIAC JOINT, PERCUTANEOUS
Anesthesia: General | Site: Hip | Laterality: Left | Wound class: Clean

## 2013-08-27 MED ORDER — ROCURONIUM BROMIDE 100 MG/10ML IV SOLN
INTRAVENOUS | Status: DC | PRN
  Administered 2013-08-27: 40 mg via INTRAVENOUS

## 2013-08-27 MED ORDER — NEOSTIGMINE METHYLSULFATE 1 MG/ML IJ SOLN
INTRAMUSCULAR | Status: DC | PRN
  Administered 2013-08-27: 3 mg via INTRAVENOUS

## 2013-08-27 MED ORDER — ONDANSETRON HCL 4 MG/2ML IJ SOLN
INTRAMUSCULAR | Status: DC | PRN
  Administered 2013-08-27: 4 mg via INTRAVENOUS

## 2013-08-27 MED ORDER — FENTANYL CITRATE 0.05 MG/ML IJ SOLN: 50.0000 ug | Freq: Once | INTRAMUSCULAR | Status: DC

## 2013-08-27 MED ORDER — FENTANYL CITRATE 0.05 MG/ML IJ SOLN
INTRAMUSCULAR | Status: DC | PRN
  Administered 2013-08-27: 250 ug via INTRAVENOUS

## 2013-08-27 MED ORDER — GLYCOPYRROLATE 0.2 MG/ML IJ SOLN
INTRAMUSCULAR | Status: DC | PRN
  Administered 2013-08-27: 0.4 mg via INTRAVENOUS

## 2013-08-27 MED ORDER — MIDAZOLAM HCL 2 MG/2ML IJ SOLN: 1.0000 mg | INTRAMUSCULAR | Status: DC | PRN

## 2013-08-27 MED ORDER — HYDROMORPHONE HCL PF 1 MG/ML IJ SOLN
INTRAMUSCULAR | Status: AC
  Filled 2013-08-27: qty 1

## 2013-08-27 MED ORDER — BIOTENE DRY MOUTH MT LIQD
15.0000 mL | Freq: Two times a day (BID) | OROMUCOSAL | Status: DC
  Administered 2013-08-27 – 2013-09-08 (×19): 15 mL via OROMUCOSAL

## 2013-08-27 MED ORDER — LIDOCAINE HCL (CARDIAC) 20 MG/ML IV SOLN
INTRAVENOUS | Status: DC | PRN
  Administered 2013-08-27: 40 mg via INTRAVENOUS

## 2013-08-27 MED ORDER — HYDROMORPHONE HCL PF 1 MG/ML IJ SOLN
0.2500 mg | INTRAMUSCULAR | Status: DC | PRN
  Administered 2013-08-27: 0.25 mg via INTRAVENOUS
  Administered 2013-08-27: 0.5 mg via INTRAVENOUS

## 2013-08-27 MED ORDER — PROPOFOL 10 MG/ML IV BOLUS
INTRAVENOUS | Status: DC | PRN
  Administered 2013-08-27: 50 mg via INTRAVENOUS

## 2013-08-27 MED ORDER — LACTATED RINGERS IV SOLN
INTRAVENOUS | Status: DC | PRN
  Administered 2013-08-27: 13:00:00 via INTRAVENOUS

## 2013-08-27 MED ORDER — MIDAZOLAM HCL 5 MG/5ML IJ SOLN
INTRAMUSCULAR | Status: DC | PRN
  Administered 2013-08-27: 2 mg via INTRAVENOUS

## 2013-08-27 MED ORDER — CEFAZOLIN SODIUM-DEXTROSE 2-3 GM-% IV SOLR
INTRAVENOUS | Status: AC
  Administered 2013-08-27: 2 g via INTRAVENOUS
  Filled 2013-08-27: qty 50

## 2013-08-27 MED ORDER — PROMETHAZINE HCL 25 MG/ML IJ SOLN: 6.2500 mg | INTRAMUSCULAR | Status: DC | PRN

## 2013-08-27 MED ORDER — LACTATED RINGERS IV SOLN
INTRAVENOUS | Status: DC | PRN
  Administered 2013-08-27 (×2): via INTRAVENOUS

## 2013-08-27 MED ORDER — 0.9 % SODIUM CHLORIDE (POUR BTL) OPTIME
TOPICAL | Status: DC | PRN
  Administered 2013-08-27: 1000 mL

## 2013-08-27 MED ORDER — OXYCODONE HCL 5 MG PO TABS
5.0000 mg | ORAL_TABLET | Freq: Once | ORAL | Status: DC | PRN
Start: 2013-08-27 — End: 2013-08-27

## 2013-08-27 MED ORDER — LACTATED RINGERS IV SOLN
INTRAVENOUS | Status: DC
  Administered 2013-08-27: 10 mL/h via INTRAVENOUS

## 2013-08-27 MED ORDER — PHENYLEPHRINE HCL 10 MG/ML IJ SOLN
INTRAMUSCULAR | Status: DC | PRN
  Administered 2013-08-27: 80 ug via INTRAVENOUS

## 2013-08-27 MED ORDER — OXYCODONE HCL 5 MG/5ML PO SOLN: 5.0000 mg | Freq: Once | ORAL | Status: DC | PRN

## 2013-08-27 MED ORDER — MORPHINE SULFATE 2 MG/ML IJ SOLN
2.0000 mg | Freq: Once | INTRAMUSCULAR | Status: AC
  Administered 2013-08-27: 2 mg via INTRAVENOUS

## 2013-08-27 SURGICAL SUPPLY — 46 items
BANDAGE ELASTIC 4 VELCRO ST LF (GAUZE/BANDAGES/DRESSINGS) ×1 IMPLANT
BANDAGE GAUZE ELAST BULKY 4 IN (GAUZE/BANDAGES/DRESSINGS) ×1 IMPLANT
BLADE SURG 15 STRL LF DISP TIS (BLADE) ×2 IMPLANT
BLADE SURG 15 STRL SS (BLADE) ×3
BRUSH SCRUB DISP (MISCELLANEOUS) ×6 IMPLANT
CAP PIN ORTHO PINK (CAP) ×2 IMPLANT
CLOTH BEACON ORANGE TIMEOUT ST (SAFETY) ×3 IMPLANT
COVER SURGICAL LIGHT HANDLE (MISCELLANEOUS) ×6 IMPLANT
DRAPE C-ARM 42X72 X-RAY (DRAPES) IMPLANT
DRAPE C-ARMOR (DRAPES) ×3 IMPLANT
DRAPE INCISE IOBAN 66X45 STRL (DRAPES) ×3 IMPLANT
DRAPE LAPAROTOMY TRNSV 102X78 (DRAPE) ×3 IMPLANT
DRAPE U-SHAPE 47X51 STRL (DRAPES) ×3 IMPLANT
DRSG EMULSION OIL 3X3 NADH (GAUZE/BANDAGES/DRESSINGS) ×1 IMPLANT
DRSG MEPILEX BORDER 4X4 (GAUZE/BANDAGES/DRESSINGS) ×1 IMPLANT
DRSG PAD ABDOMINAL 8X10 ST (GAUZE/BANDAGES/DRESSINGS) ×6 IMPLANT
ELECT REM PT RETURN 9FT ADLT (ELECTROSURGICAL) ×3
ELECTRODE REM PT RTRN 9FT ADLT (ELECTROSURGICAL) ×2 IMPLANT
GAUZE XEROFORM 5X9 LF (GAUZE/BANDAGES/DRESSINGS) ×3 IMPLANT
GLOVE BIO SURGEON STRL SZ7.5 (GLOVE) ×3 IMPLANT
GLOVE BIO SURGEON STRL SZ8 (GLOVE) ×3 IMPLANT
GLOVE BIOGEL PI IND STRL 7.5 (GLOVE) ×2 IMPLANT
GLOVE BIOGEL PI IND STRL 8 (GLOVE) ×2 IMPLANT
GLOVE BIOGEL PI INDICATOR 7.5 (GLOVE) ×1
GLOVE BIOGEL PI INDICATOR 8 (GLOVE) ×1
GOWN PREVENTION PLUS XLARGE (GOWN DISPOSABLE) ×3 IMPLANT
GOWN STRL NON-REIN LRG LVL3 (GOWN DISPOSABLE) ×6 IMPLANT
GUIDEWIRE THREADED 2.8 (WIRE) ×2 IMPLANT
KIT BASIN OR (CUSTOM PROCEDURE TRAY) ×3 IMPLANT
KIT ROOM TURNOVER OR (KITS) ×3 IMPLANT
MANIFOLD NEPTUNE II (INSTRUMENTS) ×3 IMPLANT
NS IRRIG 1000ML POUR BTL (IV SOLUTION) ×3 IMPLANT
PACK GENERAL/GYN (CUSTOM PROCEDURE TRAY) ×3 IMPLANT
PAD ARMBOARD 7.5X6 YLW CONV (MISCELLANEOUS) ×6 IMPLANT
SCREW CANN TI 16 THRD 6.5X100 (Screw) ×1 IMPLANT
SCREW CANN TI 16 THRD 6.5X105 (Screw) ×1 IMPLANT
SPONGE GAUZE 4X4 12PLY (GAUZE/BANDAGES/DRESSINGS) ×3 IMPLANT
STAPLER VISISTAT 35W (STAPLE) ×3 IMPLANT
SUT ETHILON 3 0 PS 1 (SUTURE) ×3 IMPLANT
SUT VIC AB 2-0 FS1 27 (SUTURE) ×3 IMPLANT
TOWEL OR 17X24 6PK STRL BLUE (TOWEL DISPOSABLE) ×3 IMPLANT
TOWEL OR 17X26 10 PK STRL BLUE (TOWEL DISPOSABLE) ×6 IMPLANT
UNDERPAD 30X30 INCONTINENT (UNDERPADS AND DIAPERS) ×3 IMPLANT
WASHER FOR 5.0 SCREWS (Washer) ×2 IMPLANT
WATER STERILE IRR 1000ML POUR (IV SOLUTION) ×3 IMPLANT
WIRE .062X9 INCH SMOOTH (Wire) ×2 IMPLANT
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
## 2013-08-27 NOTE — Consult Note (Signed)
I have seen and examined the patient. I agree with the findings above.  Bilateral sacral impaction fractures with L L5 pedicle involvement and left foot mutliple displaced metatarsal neck fractures. No neurovascular deficits distally. Still c/o left elbow pain also but no ecchymosis or discrete tenderness.  I discussed with the patient the risks and benefits of surgery, including the possibility of infection, nerve injury, vessel injury, wound breakdown, foot drop, arthritis, symptomatic hardware, DVT/ PE, loss of motion, and need for further surgery among others.  She understood these risks and wished to proceed.   Budd Palmer, MD 08/27/2013 2:17 PM

## 2013-08-27 NOTE — Addendum Note (Signed)
Addendum created 08/27/13 1741 by Remonia Richter, MD   Modules edited: Anesthesia Responsible Staff

## 2013-08-27 NOTE — Transfer of Care (Signed)
Immediate Anesthesia Transfer of Care Note  Patient: Susan Mccarthy  Procedure(s) Performed: Procedure(s) with comments: SACRO-ILIAC PINNING (Bilateral) - Handy Bed, OIC Cannulated Screws PERCUTANEOUS PINNING EXTREMITY (Left)  Patient Location: PACU  Anesthesia Type:General  Level of Consciousness: awake, alert  and oriented  Airway & Oxygen Therapy: Patient Spontanous Breathing and Patient connected to nasal cannula oxygen  Post-op Assessment: Report given to PACU RN and Post -op Vital signs reviewed and stable  Post vital signs: Reviewed and stable  Complications: No apparent anesthesia complications
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
## 2013-08-27 NOTE — Progress Notes (Signed)
UR completed.  Sira Adsit, RN BSN [facility] CCM Trauma/Neuro ICU Case Manager [phone]  

## 2013-08-27 NOTE — Preoperative (Signed)
Beta Blockers   Reason not to administer Beta Blockers:Not Applicable 

## 2013-08-27 NOTE — Anesthesia Postprocedure Evaluation (Signed)
Anesthesia Post Note  Patient: Susan Mccarthy  Procedure(s) Performed: Procedure(s) (LRB): SACRO-ILIAC PINNING (Bilateral) PERCUTANEOUS PINNING EXTREMITY (Left)  Anesthesia type: general  Patient location: PACU  Post pain: Pain level controlled  Post assessment: Patient's Cardiovascular Status Stable  Last Vitals:  Filed Vitals:   08/27/13 1200  BP: 112/66  Pulse: 80  Temp: 37.1 C  Resp: 20    Post vital signs: Reviewed and stable  Level of consciousness: sedated  Complications: No apparent anesthesia complications
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
## 2013-08-28 ENCOUNTER — Inpatient Hospital Stay (HOSPITAL_COMMUNITY): Payer: BC Managed Care – PPO

## 2013-08-28 LAB — CBC
Hemoglobin: 8.3 g/dL — ABNORMAL LOW (ref 12.0–15.0)
Hemoglobin: 8 g/dL — ABNORMAL LOW (ref 12.0–15.0)
MCH: 28.7 pg (ref 26.0–34.0)
MCH: 28.6 pg (ref 26.0–34.0)
MCHC: 35 g/dL (ref 30.0–36.0)
MCHC: 34.8 g/dL (ref 30.0–36.0)
MCV: 82 fL (ref 78.0–100.0)
MCV: 82.1 fL (ref 78.0–100.0)
Platelets: 120 10*3/uL — ABNORMAL LOW (ref 150–400)
RBC: 2.89 MIL/uL — ABNORMAL LOW (ref 3.87–5.11)
RBC: 2.8 MIL/uL — ABNORMAL LOW (ref 3.87–5.11)
RDW: 14.1 % (ref 11.5–15.5)
RDW: 14.1 % (ref 11.5–15.5)
WBC: 4 10*3/uL (ref 4.0–10.5)

## 2013-08-28 LAB — BASIC METABOLIC PANEL
CO2: 26 mEq/L (ref 19–32)
Calcium: 7.8 mg/dL — ABNORMAL LOW (ref 8.4–10.5)
Creatinine, Ser: 0.61 mg/dL (ref 0.50–1.10)
GFR calc non Af Amer: >90 mL/min (ref >90)
Glucose, Bld: 95 mg/dL (ref 70–99)
Potassium: 3.6 mEq/L (ref 3.5–5.1)
Sodium: 131 mEq/L — ABNORMAL LOW (ref 135–145)

## 2013-08-28 MED ORDER — WHITE PETROLATUM GEL
Status: AC
  Administered 2013-08-28: 08:00:00
  Filled 2013-08-28: qty 5

## 2013-08-28 MED ORDER — ENSURE COMPLETE PO LIQD
237.0000 mL | Freq: Two times a day (BID) | ORAL | Status: DC
  Administered 2013-08-29 – 2013-09-08 (×15): 237 mL via ORAL

## 2013-08-28 NOTE — Progress Notes (Signed)
wemt down for flex ex but had too much pain to do test. Patient examined and I agree with the assessment and plan I also spoke to her family member at the bedside. Violeta Gelinas, MD, MPH, FACS Pager: [phone]  08/28/2013 10:09 AM
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
## 2013-08-29 LAB — CBC
HCT: 23 % — ABNORMAL LOW (ref 36.0–46.0)
Hemoglobin: 8.1 g/dL — ABNORMAL LOW (ref 12.0–15.0)
MCH: 28.9 pg (ref 26.0–34.0)
MCHC: 35.2 g/dL (ref 30.0–36.0)
MCV: 82.1 fL (ref 78.0–100.0)
Platelets: 138 10*3/uL — ABNORMAL LOW (ref 150–400)
RBC: 2.8 MIL/uL — ABNORMAL LOW (ref 3.87–5.11)

## 2013-08-29 LAB — BASIC METABOLIC PANEL
BUN: 6 mg/dL (ref 6–23)
CO2: 25 mEq/L (ref 19–32)
Calcium: 7.9 mg/dL — ABNORMAL LOW (ref 8.4–10.5)
GFR calc Af Amer: >90 mL/min (ref >90)
GFR calc non Af Amer: >90 mL/min (ref >90)
Glucose, Bld: 102 mg/dL — ABNORMAL HIGH (ref 70–99)

## 2013-08-29 MED ORDER — OXYCODONE HCL 5 MG PO TABS
10.0000 mg | ORAL_TABLET | ORAL | Status: DC | PRN
  Administered 2013-08-29: 20 mg via ORAL
  Administered 2013-08-29: 15 mg via ORAL
  Administered 2013-08-30: 20 mg via ORAL
  Administered 2013-08-30 (×2): 15 mg via ORAL
  Administered 2013-08-30 – 2013-08-31 (×2): 10 mg via ORAL
  Filled 2013-08-29: qty 2
  Filled 2013-08-29: qty 3
  Filled 2013-08-29: qty 4
  Filled 2013-08-29: qty 2
  Filled 2013-08-29 (×2): qty 3
  Filled 2013-08-29: qty 4

## 2013-08-29 MED ORDER — WARFARIN SODIUM 4 MG PO TABS
4.0000 mg | ORAL_TABLET | Freq: Once | ORAL | Status: AC
  Administered 2013-08-29: 4 mg via ORAL
  Filled 2013-08-29: qty 1

## 2013-08-29 MED ORDER — WARFARIN - PHARMACIST DOSING INPATIENT
Freq: Every day | Status: DC
  Administered 2013-08-29 – 2013-09-07 (×2)

## 2013-08-29 MED ORDER — DIPHENHYDRAMINE HCL 25 MG PO CAPS
25.0000 mg | ORAL_CAPSULE | Freq: Four times a day (QID) | ORAL | Status: DC | PRN
  Administered 2013-08-29 – 2013-09-05 (×4): 25 mg via ORAL
  Filled 2013-08-29 (×4): qty 1

## 2013-08-29 NOTE — Progress Notes (Signed)
Agree with A&P of MJ,PA. She is stable and pain control appears to be main issue

## 2013-08-29 NOTE — Progress Notes (Signed)
Clinical Social Work Department BRIEF PSYCHOSOCIAL ASSESSMENT 08/29/2013  Patient:  Susan Mccarthy, Susan Mccarthy     Account Number:  [id number]     Admit date:  08/26/2013  Clinical Social Worker:  Hendricks Milo  Date/Time:  08/29/2013 07:43 PM  Referred by:  RN  Date Referred:  08/29/2013 Referred for  SNF Placement   Other Referral:   Interview type:  Patient Other interview type:   Patient was agreeable to SNF search in [facility]. Patient does not have preferance at this time.    PSYCHOSOCIAL DATA Living Status:  HUSBAND Admitted from facility:   Level of care:   Primary support name:  Dorna Mai Primary support relationship to patient:  SPOUSE Degree of support available:   Very supportive.    CURRENT CONCERNS  Other Concerns:    SOCIAL WORK ASSESSMENT / PLAN Patient agreeable to SNF search in [facility]. Patient has no preference in facility at this time.   Assessment/plan status:  Psychosocial Support/Ongoing Assessment of Needs Other assessment/ plan:   Information/referral to community resources:   CSW gave patient SNF list.    PATIENT'S/FAMILY'S RESPONSE TO PLAN OF CARE: Patient agreeable to SNF search in [facility].
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
## 2013-08-29 NOTE — Progress Notes (Addendum)
Clinical Social Work Department CLINICAL SOCIAL WORK PLACEMENT NOTE 08/29/2013  Patient:  Susan Mccarthy, Susan Mccarthy  Account Number:  [id number] Admit date:  08/26/2013  Clinical Social Worker:  Jetta Lout, Theresia Majors  Date/time:  08/29/2013 07:47 PM  Clinical Social Work is seeking post-discharge placement for this patient at the following level of care:   SKILLED NURSING   (*CSW will update this form in Epic as items are completed)   08/29/2013  Patient/family provided with [facility] Department of Clinical Social Work's list of facilities offering this level of care within the geographic area requested by the patient (or if unable, by the patient's family).  08/29/2013  Patient/family informed of their freedom to choose among providers that offer the needed level of care, that participate in Medicare, Medicaid or managed care program needed by the patient, have an available bed and are willing to accept the patient.  08/29/2013  Patient/family informed of [facility]' ownership interest in [facility], as well as of the fact that they are under no obligation to receive care at this facility.  PASARR submitted to EDS on 08/29/2013 PASARR number received from EDS on   FL2 transmitted to all facilities in geographic area requested by pt/family on  08/29/2013 FL2 transmitted to all facilities within larger geographic area on   Patient informed that his/her managed care company has contracts with or will negotiate with  certain facilities, including the following:     Patient/family informed of bed offers received:  08/31/2013  (JS) Patient chooses bed at  Physician recommends and patient chooses bed at    Patient to be transferred to  on   Patient to be transferred to facility by   The following physician request were entered in Epic:   Additional Comments: Patient agreeable to SNF search in [facility].

## 2013-08-30 LAB — URINALYSIS, ROUTINE W REFLEX MICROSCOPIC
Glucose, UA: NEGATIVE mg/dL
Ketones, ur: 15 mg/dL — AB
Leukocytes, UA: NEGATIVE
Nitrite: NEGATIVE
Protein, ur: 30 mg/dL — AB
Specific Gravity, Urine: 1.014 (ref 1.005–1.030)
Urobilinogen, UA: 4 mg/dL — ABNORMAL HIGH (ref 0.0–1.0)
pH: 7 (ref 5.0–8.0)

## 2013-08-30 LAB — TYPE AND SCREEN
ABO/RH(D): B POS
Antibody Screen: NEGATIVE
Unit division: 0

## 2013-08-30 LAB — URINE MICROSCOPIC-ADD ON

## 2013-08-30 LAB — PROTIME-INR: INR: 0.91 (ref 0.00–1.49)

## 2013-08-30 MED ORDER — ACETAMINOPHEN 325 MG PO TABS
650.0000 mg | ORAL_TABLET | Freq: Four times a day (QID) | ORAL | Status: DC | PRN
  Administered 2013-09-01: 650 mg via ORAL
  Filled 2013-08-30: qty 2

## 2013-08-30 MED ORDER — WARFARIN SODIUM 5 MG PO TABS
5.0000 mg | ORAL_TABLET | Freq: Once | ORAL | Status: AC
  Administered 2013-08-30: 5 mg via ORAL
  Filled 2013-08-30: qty 1

## 2013-08-30 MED ORDER — CYCLOBENZAPRINE HCL 5 MG PO TABS
5.0000 mg | ORAL_TABLET | Freq: Three times a day (TID) | ORAL | Status: DC
  Administered 2013-08-30 – 2013-09-08 (×27): 5 mg via ORAL
  Filled 2013-08-30 (×32): qty 1

## 2013-08-30 NOTE — Progress Notes (Signed)
Patient acting very confused and agitated this morning.  Patient oriented to self, but not to location, time, or situation.  Patient has stated the following: "Susan Mccarthy, when did you learn to sing?; I've walked to the bathroom three times this morning; I'm telling you I'm in pain but I don't go anywhere after that".  Reoriented patient to location, time, and situation throughout the day.  Assessed pain regularly as documented.  Patient attempting to get up out of bed without ringing, despite education about calling for help.  Bed alarm on, call bell and phone within reach, bed in lowest position with side rails up x2.  Patient denies any other needs at this time.  Will continue to monitor.

## 2013-08-30 NOTE — Progress Notes (Signed)
Pt. Found to have temperature of 100.1.  MD notified. Obtained order for Tylenol if temp greater than 103.  Also, RN to I/O cath if patient unable to void to obtain UA.  VSS.  Will continue to monitor.

## 2013-08-30 NOTE — Progress Notes (Signed)
3 Days Post-Op  Subjective: Having a lot of back and leg spasms/pain when trying to sit up.  Has to be laid back down to be comfortable  Objective: Vital signs in last 24 hours: Temp:  [98.7 F (37.1 C)-99.6 F (37.6 C)] 98.7 F (37.1 C) (09/28 0553) Pulse Rate:  [88-95] 88 (09/28 0553) Resp:  [16] 16 (09/28 0553) BP: (118-137)/(73-83) 130/79 mmHg (09/28 0553) SpO2:  [90 %-95 %] 94 % (09/28 0553) Last BM Date: 08/26/13  Intake/Output from previous day: 09/27 0701 - 09/28 0700 In: 720 [P.O.:720] Out: 800 [Urine:800] Intake/Output this shift:    Lungs clear Abdomen soft, non tender/non distended  Lab Results:   Recent Labs  08/28/13 1445 08/29/13 0415  WBC 4.0 5.6  HGB 8.0* 8.1*  HCT 23.0* 23.0*  PLT 120* 138*   BMET  Recent Labs  08/28/13 0355 08/29/13 0415  NA 131* 127*  K 3.6 3.8  CL 97 91*  CO2 26 25  GLUCOSE 95 102*  BUN 3* 6  CREATININE 0.61 0.62  CALCIUM 7.8* 7.9*   PT/INR  Recent Labs  08/30/13 0610  LABPROT 12.1  INR 0.91   ABG No results found for this basename: PHART, PCO2, PO2, HCO3,  in the last 72 hours  Studies/Results: No results found.  Anti-infectives: Anti-infectives   Start     Dose/Rate Route Frequency Ordered Stop   08/27/13 1408  ceFAZolin (ANCEF) 2-3 GM-% IVPB SOLR    Comments:  BECKNER, ZACK: cabinet override      08/27/13 1408 08/27/13 1445   08/26/13 2200  ceFAZolin (ANCEF) IVPB 1 g/50 mL premix  Status:  Discontinued     1 g 100 mL/hr over 30 Minutes Intravenous 3 times per day 08/26/13 1824 08/28/13 0911      Assessment/Plan: s/p Procedure(s) with comments: SACRO-ILIAC PINNING (Bilateral) - Handy Bed, OIC Cannulated Screws PERCUTANEOUS PINNING EXTREMITY (Left)  Will adjust meds to help with pain Continue all other care  LOS: 4 days    Caroll Cunnington A 08/30/2013
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
## 2013-08-31 ENCOUNTER — Inpatient Hospital Stay (HOSPITAL_COMMUNITY): Payer: BC Managed Care – PPO

## 2013-08-31 DIAGNOSIS — F29 Unspecified psychosis not due to a substance or known physiological condition: Secondary | ICD-10-CM

## 2013-08-31 LAB — PROTIME-INR
INR: 0.96 (ref 0.00–1.49)
Prothrombin Time: 12.6 seconds (ref 11.6–15.2)

## 2013-08-31 LAB — CBC
HCT: 21.7 % — ABNORMAL LOW (ref 36.0–46.0)
Hemoglobin: 7.8 g/dL — ABNORMAL LOW (ref 12.0–15.0)
MCHC: 35.9 g/dL (ref 30.0–36.0)
MCV: 80.1 fL (ref 78.0–100.0)
Platelets: 177 10*3/uL (ref 150–400)
RBC: 2.71 MIL/uL — ABNORMAL LOW (ref 3.87–5.11)
RDW: 13.6 % (ref 11.5–15.5)
WBC: 6 10*3/uL (ref 4.0–10.5)

## 2013-08-31 MED ORDER — CLONAZEPAM 0.5 MG PO TABS
0.5000 mg | ORAL_TABLET | Freq: Three times a day (TID) | ORAL | Status: DC | PRN
  Administered 2013-08-31: 0.5 mg via ORAL
  Filled 2013-08-31: qty 1

## 2013-08-31 MED ORDER — FERROUS SULFATE 325 (65 FE) MG PO TABS
325.0000 mg | ORAL_TABLET | Freq: Two times a day (BID) | ORAL | Status: DC
  Administered 2013-08-31 – 2013-09-08 (×16): 325 mg via ORAL
  Filled 2013-08-31 (×18): qty 1

## 2013-08-31 MED ORDER — WARFARIN SODIUM 5 MG PO TABS
5.0000 mg | ORAL_TABLET | Freq: Once | ORAL | Status: AC
  Administered 2013-08-31: 5 mg via ORAL
  Filled 2013-08-31: qty 1

## 2013-08-31 MED ORDER — SODIUM CHLORIDE 1 G PO TABS
1.0000 g | ORAL_TABLET | Freq: Two times a day (BID) | ORAL | Status: DC
  Administered 2013-08-31 – 2013-09-01 (×2): 1 g via ORAL
  Filled 2013-08-31 (×4): qty 1

## 2013-08-31 NOTE — Progress Notes (Signed)
Patient is very confused and disoriented.  Not sure why except for possibility of medication related.    No structural problem or injuries.  CPM  This patient has been seen and I agree with the findings and treatment plan.  Marta Lamas. Gae Bon, MD, FACS [phone] (pager) [phone] (direct pager) Trauma Surgeon
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
## 2013-08-31 NOTE — Clinical Social Work Note (Signed)
Clinical Social Worker continuing to follow patient and family for support and discharge planning.  Patient family at bedside, did not identify himself as patient ex-husband until bed offers already provided.  CSW then called patient current husband to provide bed offers and left an additional list at bedside for patient husband.  CSW to follow up with patient husband to confirm facility choice and facilitate patient discharge plans once medically ready.  CSW available for support as needed.  Macario Golds, [place] [phone]

## 2013-08-31 NOTE — Progress Notes (Signed)
Husband called RN at 0725 and wanted RN to tell his wife that he called and that he was going to the office and that he would try and come see her this afternoon. RN told patient at 12 that husband called this morning, and gave Susan Mccarthy the message that her husband Susan Mccarthy called. Patient stated that she understood this information

## 2013-08-31 NOTE — Progress Notes (Signed)
Occupational Therapy Treatment Patient Details Name: Susan Mccarthy MRN: [id number] DOB: [birth year] Today's Date: 08/31/2013 Time: 7829-5621 OT Time Calculation (min): 38 min  OT Assessment / Plan / Recommendation       OT comments  Pt. Very agitated and only oriented to self this a.m., unable to rate pain but con't. To scream and yell spontaneously.  Question hallucination as pt. Was trying to introduce me to her mother who she thought was present in room, also thinking she was at home.  Poor initiation of movement secondary to pain, and slow/poor processing of one-step commands   Follow Up Recommendations  SNF                      Frequency Min 2X/week   Progress towards OT Goals Progress towards OT goals: Progressing toward goals  Plan Discharge plan remains appropriate    Precautions / Restrictions Precautions Precautions: Fall;Cervical Required Braces or Orthoses: Cervical Brace Cervical Brace: Hard collar;At all times Restrictions RLE Weight Bearing: Non weight bearing LLE Weight Bearing: Non weight bearing   Pertinent Vitals/Pain Unable to rate but yelling and screaming with or without movement    ADL  Eating/Feeding: Set up Where Assessed - Eating/Feeding: Bed level Upper Body Bathing: Performed;+2 Total assistance Where Assessed - Upper Body Bathing: Rolling right and/or left Lower Body Bathing: Performed;+2 Total assistance Where Assessed - Lower Body Bathing: Rolling right and/or left Upper Body Dressing: Performed;Maximal assistance Where Assessed - Upper Body Dressing: Supine, head of bed up ADL Comments: pt. able to roll l/r with max a, max encouragement, and increased time for initiation of task.  poor follow through secondary to pt. presents very confused this a.m. with limited orientation, also screaming and yelling without warning very impulsive       OT Goals(current goals can now be found in the care plan section)    Visit Information  Last OT  Received On: 08/31/13    Subjective Data   "well aren't you going to introduce yourself, you can see my mother is right there, and my husband should be right back" (mother and husband not there, and pt. Unaware we were in a hospital room)   Prior Functioning       Cognition  Cognition Arousal/Alertness: Awake/alert Behavior During Therapy: Agitated;Impulsive;Restless Overall Cognitive Status: Impaired/Different from baseline Area of Impairment: Orientation;Safety/judgement;Awareness;Problem solving;Following commands Orientation Level: Disoriented to;Place;Time;Situation Following Commands: Follows one step commands with increased time;Follows one step commands inconsistently Safety/Judgement: Decreased awareness of safety;Decreased awareness of deficits General Comments: pt. thought she was at home and was initially angry that i did not introduce myself to her mother who she thought was in the room    Mobility  Bed Mobility Details for Bed Mobility Assistance: rolls l/r tot a x 2 with increased time and use of pads to initiate roll    Exercises   attempted to provide theraband per therapist rec. But pt. Unable to follow inst. For use at this time   Balance     End of Session OT - End of Session Activity Tolerance: Patient limited by pain;Treatment limited secondary to agitation Patient left: in bed;with call bell/phone within reach;with bed alarm set       Robet Leu, COTA/L 08/31/2013, 7:57 AM
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
## 2013-08-31 NOTE — Progress Notes (Signed)
Iv team called at 1530, and IV nurse told RN that they would come and restart her IV, and to leave the current one in until they restarted a new site.
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
## 2013-09-01 ENCOUNTER — Encounter (HOSPITAL_COMMUNITY): Payer: Self-pay | Admitting: Orthopedic Surgery

## 2013-09-01 DIAGNOSIS — E871 Hypo-osmolality and hyponatremia: Secondary | ICD-10-CM

## 2013-09-01 LAB — CBC
HCT: 22.5 % — ABNORMAL LOW (ref 36.0–46.0)
MCH: 28.9 pg (ref 26.0–34.0)
MCHC: 35.6 g/dL (ref 30.0–36.0)
MCV: 81.2 fL (ref 78.0–100.0)
Platelets: 240 10*3/uL (ref 150–400)
RBC: 2.77 MIL/uL — ABNORMAL LOW (ref 3.87–5.11)
RDW: 14.4 % (ref 11.5–15.5)

## 2013-09-01 LAB — BASIC METABOLIC PANEL
BUN: 5 mg/dL — ABNORMAL LOW (ref 6–23)
Calcium: 8.6 mg/dL (ref 8.4–10.5)
Creatinine, Ser: 0.56 mg/dL (ref 0.50–1.10)
GFR calc Af Amer: >90 mL/min (ref >90)
GFR calc non Af Amer: >90 mL/min (ref >90)

## 2013-09-01 LAB — PROTIME-INR: Prothrombin Time: 15.8 seconds — ABNORMAL HIGH (ref 11.6–15.2)

## 2013-09-01 MED ORDER — SODIUM CHLORIDE 1 G PO TABS
1.0000 g | ORAL_TABLET | Freq: Three times a day (TID) | ORAL | Status: DC
  Administered 2013-09-01 – 2013-09-08 (×21): 1 g via ORAL
  Filled 2013-09-01 (×25): qty 1

## 2013-09-01 MED ORDER — CLONAZEPAM 1 MG PO TABS
1.0000 mg | ORAL_TABLET | Freq: Three times a day (TID) | ORAL | Status: DC | PRN
  Administered 2013-09-01 – 2013-09-08 (×19): 1 mg via ORAL
  Filled 2013-09-01 (×19): qty 1

## 2013-09-01 MED ORDER — HYDROCODONE-ACETAMINOPHEN 5-325 MG PO TABS
0.5000 | ORAL_TABLET | ORAL | Status: DC | PRN
  Administered 2013-09-01: 2 via ORAL
  Administered 2013-09-01 (×3): 1 via ORAL
  Administered 2013-09-02: 2 via ORAL
  Filled 2013-09-01 (×3): qty 1
  Filled 2013-09-01 (×2): qty 2

## 2013-09-01 MED ORDER — WARFARIN SODIUM 5 MG PO TABS
5.0000 mg | ORAL_TABLET | Freq: Once | ORAL | Status: AC
  Administered 2013-09-01: 5 mg via ORAL
  Filled 2013-09-01: qty 1
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
## 2013-09-01 NOTE — Progress Notes (Signed)
Per pharmacy, patient has no more zelboraf in stock.  Patient's husband, Mariana Kaufman, was notified and requested to bring more medication in to be dispensed to the patient.  Mariana Kaufman, at the bedside, agrees and states he needs to contact the company that sends the medication to the patient.  Will continue to monitor.

## 2013-09-01 NOTE — Progress Notes (Signed)
Asked for home dose of Klonopin. I ordered it. She does not feel she has enough help at home to be able to go with [facility]. Likely will need SNF. Patient examined and I agree with the assessment and plan  Violeta Gelinas, MD, MPH, FACS Pager: [phone]  09/01/2013 10:07 AM
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
## 2013-09-01 NOTE — Progress Notes (Signed)
CC:   Georgina Quint. Plotnikov, MD  DIAGNOSES: 1. Metastatic melanoma. 2. 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  CURRENT THERAPY: 1. Zelboraf 360 mg p.o. b.i.d. 2. Status post thyroidectomy.  INTERIM HISTORY:  Susan Mccarthy comes in for followup.  We last saw her back in June.  A PET scan done back in June did not show any evidence of active disease.  She continues to do incredibly well with the Zelboraf.  She has had a little bit of skin toxicity.  Nothing that she is bothered by.  She has had no bleeding.  She has had no fever.  She has had some diarrhea.  I told her to try a probiotic for the diarrhea.  There has been no cough.  She has been under some stress.  She had a family reunion up in [place].  There were some issues that happened up there that still are bothering her.  The patient has been going out to [facility] for her thyroid.  I think she goes out again for a thyroid ultrasound.  She has had no problems with swallowing.  There has been no double vision or blurred vision.  She may have an occasional ocular tearing.  PHYSICAL EXAMINATION:  General:  This is a well-developed, well- nourished white female in no obvious distress.  Vital Signs:  Show a temperature of 98.2, pulse  77, respiratory rate 14, blood pressure 133/76.  Weight is 109.  Head and Neck Exam:  Shows a normocephalic, atraumatic skull.  There are no ocular or oral lesions.  There are no palpable cervical or supraclavicular lymph nodes.  Lungs:  Clear bilaterally.  There are no rales, wheezes or rhonchi.  Cardiac Exam: Regular rate and rhythm with a normal S1, S2.  There are no murmurs, rubs or bruits.  Axillary Exam:  Shows no axillary adenopathy bilaterally.  She has the well-healed axillary lymphadenectomy scar over the right axilla.  Abdomen:  Soft.  She has good bowel sounds.  There is no fluid wave.  There is no palpable abdominal mass.  There is a palpable  hepatosplenomegaly.  Back Exam:  Shows the wide local excision scar in the right mid back.  She has no tenderness over the spine, ribs, or hips.  Extremities:  Show no clubbing, cyanosis or edema.  She has good range motion of her joints.  She has good strength in her joints. Skin Exam:  Does show some actinic keratoses.  I do not see any suspicious malignant skin tumors.  LABORATORY STUDIES:  White cell count is 4.6, hemoglobin 14.5, hematocrit 41.3, platelet count 252.  IMPRESSION:  [name] is a very nice 62 year old white female with metastatic melanoma.  She initially presented with locally advanced disease with axillary lymph node involvement.  She was treated with interferon.  Fortunately, she recovered very quickly.  Her tumor is BRAF positive.  She has been on Zelboraf now for a good year and a half.  She has done incredibly well with this.  She appears to be in remission from my point of view.  We will go ahead and plan for another PET scan on her.  We will get this done in about 2 months or so.  I am sure at some point she will progress.  If so, we can certainly consider double therapy with Tafinlar and Mekinist.  We could also consider Yervoy.  She goes out to [facility] for her thyroid issues.  We will  see her back after her PET scan is done.    ______________________________ [name], M.D. PRE/MEDQ  D:  08/25/2013  T:  09/01/2013  Job:  [id number]
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
## 2013-09-01 NOTE — Clinical Social Work Note (Signed)
Clinical Social Worker continuing to follow patient and family for support and discharge planning needs.  CSW spoke with patient husband over the phone to further discuss patient discharge plans.  Patient husband states that he is working and has not been able to visit facilities and make a choice.  CSW expressed the importance of the urgency to decide on a facility due to the need for insurance authorization.  Patient husband states, that he is not ready for patient to discharge - per patient husband, MD to notify 3 days prior to discharge and he has not yet been notified.  CSW explained the process around placement and the possibility of denial from insurance.  Patient husband states his understanding and is hopeful to provide CSW with bed choice tomorrow morning.  CSW remains available for support and to facilitate patient discharge needs once medically ready.  Macario Golds, [place] [phone]

## 2013-09-02 ENCOUNTER — Telehealth: Payer: Self-pay | Admitting: Internal Medicine

## 2013-09-02 ENCOUNTER — Other Ambulatory Visit: Payer: Self-pay | Admitting: *Deleted

## 2013-09-02 LAB — PROTIME-INR: Prothrombin Time: 18.2 seconds — ABNORMAL HIGH (ref 11.6–15.2)

## 2013-09-02 LAB — BASIC METABOLIC PANEL
CO2: 22 mEq/L (ref 19–32)
Calcium: 8.4 mg/dL (ref 8.4–10.5)
GFR calc Af Amer: >90 mL/min (ref >90)
GFR calc non Af Amer: >90 mL/min (ref >90)
Sodium: 137 mEq/L (ref 135–145)

## 2013-09-02 MED ORDER — HYDROCODONE-ACETAMINOPHEN 10-325 MG PO TABS
0.5000 | ORAL_TABLET | ORAL | Status: DC | PRN
  Administered 2013-09-02: 1 via ORAL
  Administered 2013-09-02: 1.5 via ORAL
  Administered 2013-09-03 – 2013-09-04 (×9): 1 via ORAL
  Administered 2013-09-05: 1.5 via ORAL
  Administered 2013-09-05: 1 via ORAL
  Administered 2013-09-05 (×2): 1.5 via ORAL
  Administered 2013-09-06 – 2013-09-08 (×11): 1 via ORAL
  Filled 2013-09-02: qty 1
  Filled 2013-09-02: qty 2
  Filled 2013-09-02 (×7): qty 1
  Filled 2013-09-02: qty 2
  Filled 2013-09-02 (×3): qty 1
  Filled 2013-09-02: qty 2
  Filled 2013-09-02 (×9): qty 1
  Filled 2013-09-02: qty 2
  Filled 2013-09-02: qty 1
  Filled 2013-09-02: qty 2
  Filled 2013-09-02 (×2): qty 1
  Filled 2013-09-02: qty 2

## 2013-09-02 MED ORDER — WARFARIN SODIUM 5 MG PO TABS
5.0000 mg | ORAL_TABLET | Freq: Once | ORAL | Status: AC
  Administered 2013-09-02: 5 mg via ORAL
  Filled 2013-09-02: qty 1
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
## 2013-09-02 NOTE — Clinical Social Work Note (Signed)
Clinical Social Worker continuing to follow patient and family for support and discharge planning needs.  Patient husband returned CSW phone call this morning, however was not able to state a bed choice at this time.  CSW expressed the urgency to decide on a facility due to patient medical stability.  CSW made patient husband aware that since patient has been deemed medically ready, that facility choice is a necessity or insurance will no longer cover hospital stay.  Patient husband claims that he will look into facility options today and notify of bed choice tomorrow morning.  Facility to submit for insurance authorization once bed chosen.  CSW has left message with [facility]  [phone] to attempt authorization prior to bed choice - await return call.  CSW has prepared patient husband to be preparing for plan B in the event that insurance denies coverage for SNF stay.  CSW remains available for support and to facilitate patient discharge needs once medically ready and insurance authorization received.  Macario Golds, [place] [phone]

## 2013-09-02 NOTE — Progress Notes (Signed)
Patient is anemic but stable.  Can go home or to SNF at anytime.  This patient has been seen and I agree with the findings and treatment plan.  Marta Lamas. Gae Bon, MD, FACS [phone] (pager) [phone] (direct pager) Trauma Surgeon

## 2013-09-02 NOTE — Telephone Encounter (Signed)
Noted. Thank you for letting me know. Hope she is doing better Thx
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
## 2013-09-02 NOTE — Telephone Encounter (Signed)
error 

## 2013-09-02 NOTE — Progress Notes (Signed)
Patient's husband brought her chemo drug Zelboraf and it was picked up by Misty Stanley in main pharm. Patient didn't receive her am dose because the med wasn't here

## 2013-09-03 LAB — CBC
MCH: 28.4 pg (ref 26.0–34.0)
MCHC: 33.9 g/dL (ref 30.0–36.0)
RBC: 2.99 MIL/uL — ABNORMAL LOW (ref 3.87–5.11)
RDW: 15.9 % — ABNORMAL HIGH (ref 11.5–15.5)

## 2013-09-03 LAB — PROTIME-INR
INR: 1.61 — ABNORMAL HIGH (ref 0.00–1.49)
Prothrombin Time: 18.7 seconds — ABNORMAL HIGH (ref 11.6–15.2)

## 2013-09-03 MED ORDER — WARFARIN SODIUM 5 MG PO TABS
5.0000 mg | ORAL_TABLET | Freq: Once | ORAL | Status: AC
  Administered 2013-09-03: 5 mg via ORAL
  Filled 2013-09-03 (×2): qty 1
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
## 2013-09-03 NOTE — Progress Notes (Signed)
Case Management Medical Director, Dr. Geoffry Paradise, has issued a denial effective yesterday, 09/02/2013, for continued stay.

## 2013-09-03 NOTE — Clinical Social Work Note (Addendum)
Clinical Social Worker continuing to follow patient and family for support and discharge planning needs.  CSW spoke with husband over the phone who continues to delay patient discharge at this time.  Patient husband states "I'm 60 miles away and can't just go look at facilities immediately."  CSW provided patient husband with bed offers on Monday afternoon 09/29.  Patient husband claims that he plans to notify of bed choice today.  Patient husband has verbally stated, "she will not discharge today."  Patient husband has officially stated that he is appealing the discharge.  CSW informed patient husband that hospital administration and legal counsel would now be involved.  Clinical Social Worker contacted Inge Rise with [employer] who confirmed that patient husband must chose a facility and the facility must submit for the authorization.  CSW has notified CM - CM to contact Medical Director (Dr. Jacky Kindle) to discuss next step.  CSW remains available for support and to facilitate patient discharge needs.  Macario Golds, LCSW [phone]  12:12 pm  CSW spoke with Interior and spatial designer of Social Work [facility] Rife) who states that since patient husband has decided to delay the SNF process by not choosing a facility - legally we have the right to sign the patient in to the closest facility to the hospital that has made a bed offer.  At this time, [facility] is the facility closest with a bed offer available.  CSW has spoken with facility who will initiate authorization for [employer] and follow up once authorization is received.  CSW has also had a long conversation with patient husband over the phone providing this information.  Patient husband is very angry and states that he will just choose [facility] for himself at this time.  CSW will continue to follow and proceed with facilitating patient discharge if insurance approval received.  CSW has prepared patient husband for the possibility of insurance  denial and patient return home - patient husband refusing.  CSW remains available for support.
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
## 2013-09-03 NOTE — Clinical Social Work Note (Signed)
Clinical Social Worker continuing to pursue SNF placement.  Patient husband has chosen [facility] and [facility] authorization was started at 11:00am on 10/02 by the facility.  CSW hopeful for authorization/denial by tomorrow to facilitate patient discharge to appropriate venue.  CSW remains available for support and to facilitate patient discharge needs once medically ready.  Macario Golds, [place] [phone]
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
## 2013-09-03 NOTE — Progress Notes (Signed)
She states husband is selecting SNF this AM and he will let SW know. Plan D/C to SNF today. Patient examined and I agree with the assessment and plan  Violeta Gelinas, MD, MPH, FACS Pager: [phone]  09/03/2013 10:24 AM

## 2013-09-04 ENCOUNTER — Encounter: Payer: Self-pay | Admitting: *Deleted

## 2013-09-04 LAB — PROTIME-INR
INR: 1.43 (ref 0.00–1.49)
Prothrombin Time: 17.1 seconds — ABNORMAL HIGH (ref 11.6–15.2)

## 2013-09-04 MED ORDER — WARFARIN SODIUM 5 MG PO TABS
5.0000 mg | ORAL_TABLET | Freq: Once | ORAL | Status: AC
  Administered 2013-09-04: 5 mg via ORAL
  Filled 2013-09-04: qty 1

## 2013-09-04 MED ORDER — WHITE PETROLATUM GEL
Status: AC
  Filled 2013-09-04: qty 5

## 2013-09-04 MED ORDER — LOPERAMIDE HCL 2 MG PO CAPS
2.0000 mg | ORAL_CAPSULE | ORAL | Status: DC | PRN
  Filled 2013-09-04: qty 1

## 2013-09-04 MED ORDER — WARFARIN SODIUM 5 MG PO TABS
5.0000 mg | ORAL_TABLET | Freq: Once | ORAL | Status: DC
  Filled 2013-09-04: qty 1

## 2013-09-04 MED ORDER — WARFARIN SODIUM 2 MG PO TABS
2.0000 mg | ORAL_TABLET | ORAL | Status: AC
  Administered 2013-09-04: 2 mg via ORAL
  Filled 2013-09-04: qty 1

## 2013-09-04 NOTE — Progress Notes (Signed)
May need extra warfarin for bump INR.  Otherwise okay to go to SNF today.  This patient has been seen and I agree with the findings and treatment plan.  Marta Lamas. Gae Bon, MD, FACS [phone] (pager) [phone] (direct pager) Trauma Surgeon
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
## 2013-09-04 NOTE — Progress Notes (Signed)
Received a call from [facility] regarding Susan Mccarthy's Zelboraf rx. Her friend Susan Mccarthy (HIPAA authorized 01/2013) came to the office earlier in the week stating she was out of her medicine and needed it refilled. The hospital was out of the supply that Susan Mccarthy initially brought over from the house. Gave her the information regarding who to call and explained that it required someone to call to set up shipment and payment. The representative today stated it was too early to send her refill as it was last filled on 08/17/13. It can be shipped on 08/14/13 and comes in 2 bottles. Asked [facility] to hold off on the shipment as it was set up to be shipped to [facility] and will have Susan Mccarthy call them back when it is time for it to be shipped. The second bottle from last month's shipment was still at home and Susan Mccarthy did not know it. Susan Mccarthy took it over to the hospital yesterday. He will call [facility] next week to set up the shipment to go to their home as it is not known where she will be.
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
## 2013-09-05 LAB — PROTIME-INR: INR: 1.57 — ABNORMAL HIGH (ref 0.00–1.49)

## 2013-09-05 MED ORDER — WARFARIN SODIUM 5 MG PO TABS
5.0000 mg | ORAL_TABLET | Freq: Once | ORAL | Status: AC
  Administered 2013-09-05: 5 mg via ORAL
  Filled 2013-09-05: qty 1

## 2013-09-05 NOTE — Progress Notes (Signed)
No acute issues.  Placement Monday.
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
## 2013-09-06 LAB — PROTIME-INR: Prothrombin Time: 20.4 seconds — ABNORMAL HIGH (ref 11.6–15.2)

## 2013-09-06 MED ORDER — WARFARIN SODIUM 6 MG PO TABS
6.0000 mg | ORAL_TABLET | Freq: Once | ORAL | Status: AC
  Administered 2013-09-06: 6 mg via ORAL
  Filled 2013-09-06: qty 1

## 2013-09-06 MED ORDER — WARFARIN SODIUM 6 MG PO TABS
6.0000 mg | ORAL_TABLET | Freq: Once | ORAL | Status: DC
  Filled 2013-09-06: qty 1

## 2013-09-06 NOTE — Progress Notes (Signed)
PAIN CONTROL BETTER.  SNIF MONDAY
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
## 2013-09-07 LAB — PROTIME-INR
INR: 1.62 — ABNORMAL HIGH (ref 0.00–1.49)
Prothrombin Time: 18.8 seconds — ABNORMAL HIGH (ref 11.6–15.2)

## 2013-09-07 MED ORDER — ENOXAPARIN SODIUM 40 MG/0.4ML ~~LOC~~ SOLN: 40.0000 mg | SUBCUTANEOUS | Status: DC

## 2013-09-07 MED ORDER — CYCLOBENZAPRINE HCL 5 MG PO TABS: 5.0000 mg | ORAL_TABLET | Freq: Three times a day (TID) | ORAL | Status: DC

## 2013-09-07 MED ORDER — HYDROCODONE-ACETAMINOPHEN 10-325 MG PO TABS: 0.5000 | ORAL_TABLET | Freq: Four times a day (QID) | ORAL | Status: DC | PRN

## 2013-09-07 MED ORDER — TRAMADOL HCL 50 MG PO TABS: 100.0000 mg | ORAL_TABLET | Freq: Three times a day (TID) | ORAL | Status: DC | PRN

## 2013-09-07 MED ORDER — FERROUS SULFATE 325 (65 FE) MG PO TABS: 325.0000 mg | ORAL_TABLET | Freq: Two times a day (BID) | ORAL | Status: DC

## 2013-09-07 MED ORDER — WARFARIN SODIUM 4 MG PO TABS
8.0000 mg | ORAL_TABLET | Freq: Once | ORAL | Status: AC
  Administered 2013-09-07: 8 mg via ORAL
  Filled 2013-09-07: qty 2

## 2013-09-07 MED ORDER — WARFARIN SODIUM 5 MG PO TABS: 5.0000 mg | ORAL_TABLET | Freq: Every day | ORAL | Status: DC

## 2013-09-07 NOTE — Progress Notes (Signed)
UR completed.  Javaun Dimperio, RN BSN MHA CCM Trauma/Neuro ICU Case Manager [phone]  
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
## 2013-09-07 NOTE — Progress Notes (Signed)
Agree with above 
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
## 2013-09-07 NOTE — Clinical Social Work Note (Signed)
Clinical Social Worker continuing to follow patient and family for support and discharge planning needs.  Patient insurance [employer]) has had full 72 hours to make decision regarding authorization/denial with no response to facility or CSW.  CSW has left a message with medical director (Dr. Jacky Kindle) to discuss next step with [employer].  CSW Chiropodist updated on patient situation at this time.  CSW will continue to follow for support and continue to make attempts to communicate with [employer].  CSW has updated patient husband Nurse, children's) by phone.  CSW remains available as needed to facilitate patient discharge needs once insurance authorization provided.  Macario Golds, [place] [phone]
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
## 2013-09-08 ENCOUNTER — Inpatient Hospital Stay (HOSPITAL_COMMUNITY): Payer: BC Managed Care – PPO

## 2013-09-08 LAB — PROTIME-INR
INR: 1.6 — ABNORMAL HIGH (ref 0.00–1.49)
Prothrombin Time: 18.6 seconds — ABNORMAL HIGH (ref 11.6–15.2)

## 2013-09-08 LAB — CBC
Hemoglobin: 10.6 g/dL — ABNORMAL LOW (ref 12.0–15.0)
MCH: 29 pg (ref 26.0–34.0)
MCHC: 33.7 g/dL (ref 30.0–36.0)
Platelets: 469 10*3/uL — ABNORMAL HIGH (ref 150–400)
RBC: 3.66 MIL/uL — ABNORMAL LOW (ref 3.87–5.11)
RDW: 17.4 % — ABNORMAL HIGH (ref 11.5–15.5)

## 2013-09-08 MED ORDER — WARFARIN SODIUM 5 MG PO TABS: 5.0000 mg | ORAL_TABLET | ORAL | Status: DC

## 2013-09-08 NOTE — Clinical Social Work Note (Signed)
Clinical Social Worker facilitated patient discharge including contacting patient family and facility to confirm patient discharge plans.  CSW received confirmation from facility that insurance authorization has been received from [place].  Clinical information faxed to facility and family agreeable with plan.  CSW spoke with patient and patient husband at bedside who are understanding of patient discharge plans and agreeable with transfer.  CSW arranged ambulance transport via [facility] to [facility].  RN to call report prior to discharge.  Clinical Social Worker will sign off for now as social work intervention is no longer needed. Please consult [place] again if new need arises.  Macario Golds, [place] [phone]
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
## 2013-09-08 NOTE — Progress Notes (Signed)
Patient to be discharged to [facility] accompanied by husband.

## 2013-09-09 ENCOUNTER — Telehealth: Payer: Self-pay

## 2013-09-09 NOTE — Telephone Encounter (Signed)
Received fax from pharmacy stating that insurance will not cover Voltaren Gel 1% w/o a prior auth. Need to call [phone])  to proceed with request (ID# [id number]). Patient must have tried or failed Meloxicam or Naproxen.

## 2013-09-09 NOTE — Telephone Encounter (Signed)
Ok PA Thx 

## 2013-09-15 ENCOUNTER — Encounter: Payer: Self-pay | Admitting: Internal Medicine

## 2013-09-15 ENCOUNTER — Non-Acute Institutional Stay (SKILLED_NURSING_FACILITY): Payer: BC Managed Care – PPO | Admitting: Internal Medicine

## 2013-09-15 DIAGNOSIS — S32810K Multiple fractures of pelvis with stable disruption of pelvic ring, subsequent encounter for fracture with nonunion: Secondary | ICD-10-CM

## 2013-09-15 DIAGNOSIS — F419 Anxiety disorder, unspecified: Secondary | ICD-10-CM

## 2013-09-15 DIAGNOSIS — E039 Hypothyroidism, unspecified: Secondary | ICD-10-CM

## 2013-09-15 DIAGNOSIS — F411 Generalized anxiety disorder: Secondary | ICD-10-CM

## 2013-09-15 DIAGNOSIS — D62 Acute posthemorrhagic anemia: Secondary | ICD-10-CM

## 2013-09-15 DIAGNOSIS — IMO0002 Reserved for concepts with insufficient information to code with codable children: Secondary | ICD-10-CM

## 2013-09-15 DIAGNOSIS — F329 Major depressive disorder, single episode, unspecified: Secondary | ICD-10-CM

## 2013-09-15 DIAGNOSIS — S92302K Fracture of unspecified metatarsal bone(s), left foot, subsequent encounter for fracture with nonunion: Secondary | ICD-10-CM

## 2013-09-15 DIAGNOSIS — Z7901 Long term (current) use of anticoagulants: Secondary | ICD-10-CM

## 2013-09-15 DIAGNOSIS — K5900 Constipation, unspecified: Secondary | ICD-10-CM
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
## 2013-09-16 ENCOUNTER — Non-Acute Institutional Stay: Payer: BC Managed Care – PPO | Admitting: Adult Health

## 2013-09-16 DIAGNOSIS — S32592S Other specified fracture of left pubis, sequela: Secondary | ICD-10-CM

## 2013-09-16 DIAGNOSIS — Z7901 Long term (current) use of anticoagulants: Secondary | ICD-10-CM

## 2013-09-16 DIAGNOSIS — IMO0002 Reserved for concepts with insufficient information to code with codable children: Secondary | ICD-10-CM

## 2013-09-21 ENCOUNTER — Encounter: Payer: Self-pay | Admitting: Adult Health
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
## 2013-10-15 ENCOUNTER — Other Ambulatory Visit: Payer: Self-pay | Admitting: *Deleted

## 2013-10-15 DIAGNOSIS — C439 Malignant melanoma of skin, unspecified: Secondary | ICD-10-CM

## 2013-10-15 MED ORDER — VEMURAFENIB 240 MG PO TABS: 960.0000 mg | ORAL_TABLET | Freq: Two times a day (BID) | ORAL | Status: DC

## 2013-10-21 ENCOUNTER — Encounter: Payer: Self-pay | Admitting: Adult Health

## 2013-10-21 ENCOUNTER — Non-Acute Institutional Stay: Payer: BC Managed Care – PPO | Admitting: Adult Health

## 2013-10-21 DIAGNOSIS — IMO0001 Reserved for inherently not codable concepts without codable children: Secondary | ICD-10-CM

## 2013-10-21 DIAGNOSIS — S72009D Fracture of unspecified part of neck of unspecified femur, subsequent encounter for closed fracture with routine healing: Secondary | ICD-10-CM

## 2013-10-21 DIAGNOSIS — IMO0002 Reserved for concepts with insufficient information to code with codable children: Secondary | ICD-10-CM

## 2013-10-21 DIAGNOSIS — S32810D Multiple fractures of pelvis with stable disruption of pelvic ring, subsequent encounter for fracture with routine healing: Secondary | ICD-10-CM

## 2013-10-21 DIAGNOSIS — S32402D Unspecified fracture of left acetabulum, subsequent encounter for fracture with routine healing: Secondary | ICD-10-CM

## 2013-10-21 DIAGNOSIS — S32008D Other fracture of unspecified lumbar vertebra, subsequent encounter for fracture with routine healing: Secondary | ICD-10-CM
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
## 2013-10-26 ENCOUNTER — Other Ambulatory Visit: Payer: Self-pay

## 2013-10-26 MED ORDER — OXYCODONE-ACETAMINOPHEN 10-325 MG PO TABS: 1.0000 | ORAL_TABLET | ORAL | Status: DC | PRN

## 2013-10-30 ENCOUNTER — Ambulatory Visit: Payer: BC Managed Care – PPO

## 2013-11-02 ENCOUNTER — Encounter (HOSPITAL_COMMUNITY): Payer: Self-pay

## 2013-11-02 ENCOUNTER — Encounter (HOSPITAL_COMMUNITY)
Admission: RE | Admit: 2013-11-02 | Discharge: 2013-11-02 | Disposition: A | Discharge status: Home / Self Care | Payer: BC Managed Care – PPO | Source: Ambulatory Visit | Attending: Hematology & Oncology | Admitting: Hematology & Oncology

## 2013-11-02 DIAGNOSIS — C439 Malignant melanoma of skin, unspecified: Secondary | ICD-10-CM | POA: Insufficient documentation

## 2013-11-02 DIAGNOSIS — C7800 Secondary malignant neoplasm of unspecified lung: Secondary | ICD-10-CM

## 2013-11-02 DIAGNOSIS — C4359 Malignant melanoma of other part of trunk: Secondary | ICD-10-CM

## 2013-11-02 LAB — GLUCOSE, CAPILLARY: Glucose-Capillary: 79 mg/dL (ref 70–99)

## 2013-11-02 MED ORDER — FLUDEOXYGLUCOSE F - 18 (FDG) INJECTION
15.5000 | Freq: Once | INTRAVENOUS | Status: AC | PRN
  Administered 2013-11-02: 15.5 via INTRAVENOUS

## 2013-11-09 ENCOUNTER — Ambulatory Visit (HOSPITAL_BASED_OUTPATIENT_CLINIC_OR_DEPARTMENT_OTHER): Payer: BC Managed Care – PPO | Admitting: Hematology & Oncology

## 2013-11-09 ENCOUNTER — Other Ambulatory Visit (HOSPITAL_BASED_OUTPATIENT_CLINIC_OR_DEPARTMENT_OTHER): Payer: BC Managed Care – PPO | Admitting: Lab

## 2013-11-09 VITALS — BP 103/56 | HR 84 | Temp 97.5°F | Resp 14 | Ht 64.0 in | Wt 109.0 lb

## 2013-11-09 DIAGNOSIS — C7800 Secondary malignant neoplasm of unspecified lung: Secondary | ICD-10-CM

## 2013-11-09 DIAGNOSIS — F32A Depression, unspecified: Secondary | ICD-10-CM

## 2013-11-09 DIAGNOSIS — C4359 Malignant melanoma of other part of trunk: Secondary | ICD-10-CM

## 2013-11-09 DIAGNOSIS — C73 Malignant neoplasm of thyroid gland: Secondary | ICD-10-CM

## 2013-11-09 DIAGNOSIS — F329 Major depressive disorder, single episode, unspecified: Secondary | ICD-10-CM

## 2013-11-09 LAB — CMP (CANCER CENTER ONLY)
ALT(SGPT): 22 U/L (ref 10–47)
AST: 31 U/L (ref 11–38)
Albumin: 3.2 g/dL — ABNORMAL LOW (ref 3.3–5.5)
Alkaline Phosphatase: 131 U/L — ABNORMAL HIGH (ref 26–84)
Calcium: 9 mg/dL (ref 8.0–10.3)
Chloride: 91 mEq/L — ABNORMAL LOW (ref 98–108)
Potassium: 4.5 mEq/L (ref 3.3–4.7)
Sodium: 131 mEq/L (ref 128–145)
Total Protein: 7.3 g/dL (ref 6.4–8.1)

## 2013-11-09 LAB — CBC WITH DIFFERENTIAL (CANCER CENTER ONLY)
BASO#: 0 10*3/uL (ref 0.0–0.2)
BASO%: 0.3 % (ref 0.0–2.0)
EOS%: 4.9 % (ref 0.0–7.0)
HCT: 40.4 % (ref 34.8–46.6)
HGB: 13.6 g/dL (ref 11.6–15.9)
LYMPH#: 1 10*3/uL (ref 0.9–3.3)
MCHC: 33.7 g/dL (ref 32.0–36.0)
MONO#: 0.6 10*3/uL (ref 0.1–0.9)
NEUT#: 4.3 10*3/uL (ref 1.5–6.5)
RBC: 4.77 10*6/uL (ref 3.70–5.32)
RDW: 14.1 % (ref 11.1–15.7)
WBC: 6.2 10*3/uL (ref 3.9–10.0)

## 2013-11-09 LAB — LACTATE DEHYDROGENASE: LDH: 298 U/L — ABNORMAL HIGH (ref 94–250)

## 2013-11-09 MED ORDER — AMPHETAMINE-DEXTROAMPHETAMINE 10 MG PO TABS: 10.0000 mg | ORAL_TABLET | Freq: Every day | ORAL | Status: DC

## 2013-11-09 MED ORDER — OXYCODONE-ACETAMINOPHEN 10-325 MG PO TABS: 1.0000 | ORAL_TABLET | ORAL | Status: DC | PRN

## 2013-11-09 NOTE — Progress Notes (Signed)
CC:   Georgina Quint. Plotnikov, MD  DIAGNOSES: 1. Metastatic melanoma. 2. 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  CURRENT THERAPY: 1. Zelboraf 360 mg p.o. b.i.d. 2. Status post thyroidectomy.  INTERIM HISTORY:  Ms. Bayless comes in for followup.  We last saw her back in June.  A PET scan done back in June did not show any evidence of active disease.  She continues to do incredibly well with the Zelboraf.  She has had a little bit of skin toxicity.  Nothing that she is bothered by.  She has had no bleeding.  She has had no fever.  She has had some diarrhea.  I told her to try a probiotic for the diarrhea.  There has been no cough.  The patient has been under some stress.  She had a family reunion in [place].  There are some issues that happened up there that are still bothering her.  She has been going out to [facility] for her thyroid.  I think she goes out again for a thyroid ultrasound.  She has had no problems with swallowing.  There has been no double vision or blurred vision.  She may have an occasional ocular tearing.  PHYSICAL EXAMINATION:  General:  This is a well-developed, well- nourished white female, in no obvious distress.  Vital Signs: Temperature of 98.2, pulse 77, respiratory rate 14, blood pressure 133/76.  Weight is 109.  Head and Neck:  Normocephalic, atraumatic skull.  There are no ocular or oral lesions.  There are no palpable cervical or supraclavicular lymph nodes.  Lungs:  Clear bilaterally. There are no rales, wheezes, or rhonchi.  Cardiac:  Regular rate and rhythm with a normal S1, S2.  There are no murmurs, rubs, or bruits. Axillary:  No axillary adenopathy bilaterally.  She has the well-healed axillary lymphadenectomy scar over the right axilla.  Abdomen:  Soft. She has good bowel sounds.  There is no fluid wave.  There is no palpable abdominal mass.  There is no palpable hepatosplenomegaly. Back:  Wide local excision  scar in the right mid back.  She has no tenderness over the spine, ribs, or hips.  Extremities:  No clubbing, cyanosis, or edema.  She has good range of motion of her joints.  She has good strength in her joints.  Skin:  Does show some actinic keratoses.  I do not see any suspicious malignant skin tumors.  LABORATORY STUDIES:  White cell count is 4.6, hemoglobin 14.5, hematocrit 41.3, platelet count 252.  IMPRESSION:  Ms. Kise is a very nice 62 year old white female with metastatic melanoma.  She initially presented with locally advanced disease with axillary lymph node involvement.  She was treated with interferon.  Fortunately, she recovered very quickly.  Her tumor is BRAF positive.  She has been on Zelboraf now for a good year and a half.  She has done incredibly well with this.  She appears to be in remission from my point of view.  We will go ahead and plan for another PET scan on her.  We are going to this done in about 2 months or so.  I am sure at some point, she will progress.  If so, we can certainly consider double therapy with Tafinlar and Mekinist.  We could also consider Yervoy.  She goes out to [facility] for her thyroid issues.  I will see her back after her PET scan is done.    ______________________________ [name],  M.D. PRE/MEDQ  D:  08/25/2013  T:  11/08/2013  Job:  [id number]

## 2013-11-09 NOTE — Progress Notes (Signed)
This office note has been dictated.
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
## 2013-11-16 ENCOUNTER — Ambulatory Visit: Payer: BC Managed Care – PPO | Admitting: Internal Medicine

## 2013-11-17 ENCOUNTER — Other Ambulatory Visit: Payer: Self-pay | Admitting: Nurse Practitioner

## 2013-11-17 DIAGNOSIS — C439 Malignant melanoma of skin, unspecified: Secondary | ICD-10-CM

## 2013-11-17 MED ORDER — VEMURAFENIB 240 MG PO TABS: 960.0000 mg | ORAL_TABLET | Freq: Two times a day (BID) | ORAL | Status: DC

## 2013-11-20 ENCOUNTER — Encounter: Payer: Self-pay | Admitting: *Deleted

## 2013-11-20 NOTE — Progress Notes (Signed)
Changed patients default pharmacy to [facility] for Zelboraf prescription. Per. Patient request.

## 2013-12-05 ENCOUNTER — Other Ambulatory Visit: Payer: Self-pay | Admitting: Internal Medicine

## 2013-12-15 ENCOUNTER — Encounter: Payer: Self-pay | Admitting: Nurse Practitioner

## 2013-12-15 ENCOUNTER — Other Ambulatory Visit: Payer: Self-pay | Admitting: Nurse Practitioner

## 2013-12-15 DIAGNOSIS — C439 Malignant melanoma of skin, unspecified: Secondary | ICD-10-CM

## 2013-12-15 MED ORDER — VEMURAFENIB 240 MG PO TABS: 960.0000 mg | ORAL_TABLET | Freq: Two times a day (BID) | ORAL | Status: DC

## 2013-12-15 NOTE — Progress Notes (Signed)
Pt called stating there was some confusion with her Zelboraf rx. She generally has it filled via [facility] and was unsure why she was receiving a call from [facility]. I contacted [facility] to check and see what the confusion was and per [facility] pt's insurance no longer covers their speciality pharmacy and is now requiring them to be sent to [facility]. Spoke with pt and she verbalized understanding and will call back to [facility] as she would not offer them any information as far as billing previously. She will contact us with any further concerns.

## 2013-12-21 ENCOUNTER — Other Ambulatory Visit: Payer: Self-pay | Admitting: *Deleted

## 2013-12-21 ENCOUNTER — Encounter: Payer: Self-pay | Admitting: *Deleted

## 2013-12-21 DIAGNOSIS — C439 Malignant melanoma of skin, unspecified: Secondary | ICD-10-CM

## 2013-12-21 MED ORDER — VEMURAFENIB 240 MG PO TABS: 960.0000 mg | ORAL_TABLET | Freq: Two times a day (BID) | ORAL | Status: DC

## 2013-12-21 NOTE — Progress Notes (Signed)
Received telephone call from [facility].  They are asking for a rx for pt's Zelboraf.  States she will be out of medication on Wednesday.  Our records indicate a rx for this medication was faxed on 12/15/13 but pharmacy denies they received it. Spoke with pt and she was not aware of which pharmacy the rx should be sent to.   Per Dr. Marin Olp, ok to send another rx to pharmacy so pt will not be out of medication.  Done

## 2014-01-01 ENCOUNTER — Other Ambulatory Visit: Payer: Self-pay | Admitting: Internal Medicine

## 2014-01-01 ENCOUNTER — Other Ambulatory Visit: Payer: Self-pay | Admitting: Adult Health

## 2014-01-04 ENCOUNTER — Other Ambulatory Visit: Payer: Self-pay | Admitting: *Deleted

## 2014-01-04 DIAGNOSIS — F329 Major depressive disorder, single episode, unspecified: Secondary | ICD-10-CM

## 2014-01-04 DIAGNOSIS — F32A Depression, unspecified: Secondary | ICD-10-CM

## 2014-01-04 DIAGNOSIS — C4359 Malignant melanoma of other part of trunk: Secondary | ICD-10-CM

## 2014-01-04 MED ORDER — AMPHETAMINE-DEXTROAMPHETAMINE 10 MG PO TABS: 10.0000 mg | ORAL_TABLET | Freq: Every day | ORAL | Status: DC

## 2014-01-05 ENCOUNTER — Telehealth: Payer: Self-pay | Admitting: Internal Medicine

## 2014-01-05 MED ORDER — TRAZODONE HCL 50 MG PO TABS: 50.0000 mg | ORAL_TABLET | Freq: Every evening | ORAL | Status: DC | PRN

## 2014-01-05 MED ORDER — CLONAZEPAM 1 MG PO TABS: 1.0000 mg | ORAL_TABLET | Freq: Three times a day (TID) | ORAL | Status: DC | PRN

## 2014-01-05 NOTE — Telephone Encounter (Signed)
OK to fill both prescriptions with additional refills x1. Sch OV Thank you!

## 2014-01-05 NOTE — Telephone Encounter (Signed)
Pt request refill for Trazodone and Clonazepam. [facility] stated [facility] [address] request [facility] times, never heard from our office. Pt is out of this med, please call it in the drug store if this is ok.

## 2014-01-05 NOTE — Telephone Encounter (Signed)
Done

## 2014-01-06 NOTE — Telephone Encounter (Signed)
Signed rx faxed.

## 2014-01-11 ENCOUNTER — Ambulatory Visit (HOSPITAL_BASED_OUTPATIENT_CLINIC_OR_DEPARTMENT_OTHER): Payer: BC Managed Care – PPO | Admitting: Hematology & Oncology

## 2014-01-11 ENCOUNTER — Encounter: Payer: Self-pay | Admitting: Hematology & Oncology

## 2014-01-11 ENCOUNTER — Other Ambulatory Visit (HOSPITAL_BASED_OUTPATIENT_CLINIC_OR_DEPARTMENT_OTHER): Payer: BC Managed Care – PPO | Admitting: Lab

## 2014-01-11 VITALS — BP 105/73 | HR 78 | Temp 98.2°F | Resp 14 | Ht 64.0 in | Wt 108.0 lb

## 2014-01-11 DIAGNOSIS — C4359 Malignant melanoma of other part of trunk: Secondary | ICD-10-CM

## 2014-01-11 DIAGNOSIS — F329 Major depressive disorder, single episode, unspecified: Secondary | ICD-10-CM

## 2014-01-11 DIAGNOSIS — F32A Depression, unspecified: Secondary | ICD-10-CM

## 2014-01-11 DIAGNOSIS — C7800 Secondary malignant neoplasm of unspecified lung: Secondary | ICD-10-CM

## 2014-01-11 LAB — CBC WITH DIFFERENTIAL (CANCER CENTER ONLY)
BASO#: 0 10*3/uL (ref 0.0–0.2)
BASO%: 0.5 % (ref 0.0–2.0)
EOS ABS: 0.1 10*3/uL (ref 0.0–0.5)
EOS%: 2.4 % (ref 0.0–7.0)
HEMATOCRIT: 39.5 % (ref 34.8–46.6)
HEMOGLOBIN: 13.2 g/dL (ref 11.6–15.9)
LYMPH#: 0.9 10*3/uL (ref 0.9–3.3)
LYMPH%: 20.3 % (ref 14.0–48.0)
MCH: 27.4 pg (ref 26.0–34.0)
MCHC: 33.4 g/dL (ref 32.0–36.0)
MCV: 82 fL (ref 81–101)
MONO#: 0.6 10*3/uL (ref 0.1–0.9)
MONO%: 13.6 % — ABNORMAL HIGH (ref 0.0–13.0)
NEUT#: 2.7 10*3/uL (ref 1.5–6.5)
NEUT%: 63.2 % (ref 39.6–80.0)
Platelets: 244 10*3/uL (ref 145–400)
RBC: 4.81 10*6/uL (ref 3.70–5.32)
RDW: 14.6 % (ref 11.1–15.7)
WBC: 4.2 10*3/uL (ref 3.9–10.0)

## 2014-01-11 LAB — CMP (CANCER CENTER ONLY)
ALBUMIN: 3.4 g/dL (ref 3.3–5.5)
ALT(SGPT): 10 U/L (ref 10–47)
AST: 22 U/L (ref 11–38)
Alkaline Phosphatase: 72 U/L (ref 26–84)
BUN: 11 mg/dL (ref 7–22)
CALCIUM: 9.1 mg/dL (ref 8.0–10.3)
CHLORIDE: 98 meq/L (ref 98–108)
CO2: 28 meq/L (ref 18–33)
CREATININE: 0.6 mg/dL (ref 0.6–1.2)
Glucose, Bld: 102 mg/dL (ref 73–118)
Potassium: 4.2 mEq/L (ref 3.3–4.7)
Sodium: 133 mEq/L (ref 128–145)
Total Bilirubin: 0.7 mg/dl (ref 0.20–1.60)
Total Protein: 6.6 g/dL (ref 6.4–8.1)

## 2014-01-11 LAB — LACTATE DEHYDROGENASE: LDH: 195 U/L (ref 94–250)

## 2014-01-11 MED ORDER — OXYCODONE-ACETAMINOPHEN 10-325 MG PO TABS: 1.0000 | ORAL_TABLET | ORAL | Status: DC | PRN
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
## 2014-01-24 ENCOUNTER — Other Ambulatory Visit: Payer: Self-pay | Admitting: Adult Health

## 2014-02-04 ENCOUNTER — Other Ambulatory Visit: Payer: Self-pay | Admitting: Internal Medicine

## 2014-02-05 NOTE — Telephone Encounter (Signed)
sch OV 

## 2014-02-05 NOTE — Telephone Encounter (Signed)
Clonazepam has been called to pharmacy  

## 2014-02-13 ENCOUNTER — Other Ambulatory Visit: Payer: Self-pay | Admitting: Internal Medicine

## 2014-03-03 ENCOUNTER — Other Ambulatory Visit: Payer: Self-pay | Admitting: Adult Health

## 2014-03-03 ENCOUNTER — Encounter (HOSPITAL_COMMUNITY): Admission: RE | Admit: 2014-03-03 | Payer: BC Managed Care – PPO | Source: Ambulatory Visit

## 2014-03-05 ENCOUNTER — Encounter (HOSPITAL_COMMUNITY): Payer: BC Managed Care – PPO

## 2014-03-05 ENCOUNTER — Other Ambulatory Visit: Payer: Self-pay | Admitting: Adult Health

## 2014-03-08 ENCOUNTER — Encounter (HOSPITAL_COMMUNITY): Payer: BC Managed Care – PPO

## 2014-03-08 ENCOUNTER — Telehealth: Payer: Self-pay | Admitting: Hematology & Oncology

## 2014-03-08 NOTE — Telephone Encounter (Signed)
Husband called moved 4-3 PET and 4-6 MD to 4-20 and 27th. Left RN message

## 2014-03-10 ENCOUNTER — Ambulatory Visit: Payer: BC Managed Care – PPO | Admitting: Hematology & Oncology

## 2014-03-10 ENCOUNTER — Other Ambulatory Visit: Payer: BC Managed Care – PPO | Admitting: Lab

## 2014-03-22 ENCOUNTER — Telehealth: Payer: Self-pay | Admitting: Hematology & Oncology

## 2014-03-22 ENCOUNTER — Ambulatory Visit (HOSPITAL_COMMUNITY): Payer: BC Managed Care – PPO

## 2014-03-22 NOTE — Telephone Encounter (Signed)
Husband called said pt was sick and moved 4-20 PET to 4-28. We moved 4-27 MD to 5-21. I asked if they could do the PET before 4-27 he said no, but when we hung up he said he would try. I left RN voice mail to triage

## 2014-03-29 ENCOUNTER — Ambulatory Visit: Payer: BC Managed Care – PPO | Admitting: Hematology & Oncology

## 2014-03-29 ENCOUNTER — Other Ambulatory Visit: Payer: BC Managed Care – PPO | Admitting: Lab

## 2014-03-30 ENCOUNTER — Ambulatory Visit (HOSPITAL_COMMUNITY): Payer: BC Managed Care – PPO

## 2014-04-06 ENCOUNTER — Ambulatory Visit (HOSPITAL_COMMUNITY)
Admission: RE | Admit: 2014-04-06 | Discharge: 2014-04-06 | Disposition: A | Discharge status: Home / Self Care | Payer: BC Managed Care – PPO | Source: Ambulatory Visit | Attending: Hematology & Oncology | Admitting: Hematology & Oncology

## 2014-04-06 DIAGNOSIS — J438 Other emphysema: Secondary | ICD-10-CM | POA: Insufficient documentation

## 2014-04-06 DIAGNOSIS — C4359 Malignant melanoma of other part of trunk: Secondary | ICD-10-CM | POA: Insufficient documentation

## 2014-04-06 DIAGNOSIS — K449 Diaphragmatic hernia without obstruction or gangrene: Secondary | ICD-10-CM | POA: Insufficient documentation

## 2014-04-06 LAB — GLUCOSE, CAPILLARY: Glucose-Capillary: 87 mg/dL (ref 70–99)

## 2014-04-06 MED ORDER — FLUDEOXYGLUCOSE F - 18 (FDG) INJECTION
6.8000 | Freq: Once | INTRAVENOUS | Status: AC | PRN
  Administered 2014-04-06: 6.8 via INTRAVENOUS

## 2014-04-07 ENCOUNTER — Other Ambulatory Visit: Payer: Self-pay | Admitting: *Deleted

## 2014-04-07 DIAGNOSIS — F329 Major depressive disorder, single episode, unspecified: Secondary | ICD-10-CM

## 2014-04-07 DIAGNOSIS — F32A Depression, unspecified: Secondary | ICD-10-CM

## 2014-04-07 DIAGNOSIS — C4359 Malignant melanoma of other part of trunk: Secondary | ICD-10-CM

## 2014-04-07 MED ORDER — AMPHETAMINE-DEXTROAMPHETAMINE 10 MG PO TABS
10.0000 mg | ORAL_TABLET | Freq: Every day | ORAL | Status: DC
Start: 2014-04-07 — End: 2014-06-01

## 2014-04-07 MED ORDER — OXYCODONE-ACETAMINOPHEN 10-325 MG PO TABS: 1.0000 | ORAL_TABLET | ORAL | Status: DC | PRN

## 2014-04-08 ENCOUNTER — Telehealth: Payer: Self-pay | Admitting: *Deleted

## 2014-04-08 NOTE — Telephone Encounter (Addendum)
Message copied by Lenn Sink on Thu Apr 08, 2014  3:59 PM ------      Message from: Burney Gauze R      Created: Wed Apr 07, 2014  6:31 PM       I left a message on her answering machine and told her that there is no evidence of melanoma recurrence on the PET scan. Pete ------Talked with pt and made sure that she received Dr. Dicie Beam voicemail yesterday. Pt recevied voicemail and she stated how appreciative she was that he called her personally

## 2014-04-21 ENCOUNTER — Other Ambulatory Visit: Payer: Self-pay | Admitting: *Deleted

## 2014-04-21 DIAGNOSIS — C439 Malignant melanoma of skin, unspecified: Secondary | ICD-10-CM

## 2014-04-21 MED ORDER — VEMURAFENIB 240 MG PO TABS: 960.0000 mg | ORAL_TABLET | Freq: Two times a day (BID) | ORAL | Status: DC

## 2014-04-22 ENCOUNTER — Ambulatory Visit: Payer: BC Managed Care – PPO | Admitting: Hematology & Oncology

## 2014-04-22 ENCOUNTER — Other Ambulatory Visit: Payer: BC Managed Care – PPO | Admitting: Lab

## 2014-04-22 ENCOUNTER — Telehealth: Payer: Self-pay | Admitting: Hematology & Oncology

## 2014-04-22 NOTE — Telephone Encounter (Signed)
Pt moved 5-21 to 6-24 husband said she is so very very sick. RN aware

## 2014-05-26 ENCOUNTER — Ambulatory Visit: Payer: BC Managed Care – PPO | Admitting: Hematology & Oncology

## 2014-05-26 ENCOUNTER — Other Ambulatory Visit: Payer: BC Managed Care – PPO | Admitting: Lab

## 2014-05-26 ENCOUNTER — Telehealth: Payer: Self-pay | Admitting: Hematology & Oncology

## 2014-05-26 NOTE — Telephone Encounter (Signed)
Patient husband called and cx 05/26/14 apt and resch for 06/28/14.. Nurse was notified of cx apt

## 2014-05-31 ENCOUNTER — Other Ambulatory Visit: Payer: Self-pay | Admitting: *Deleted

## 2014-06-01 ENCOUNTER — Other Ambulatory Visit: Payer: Self-pay | Admitting: *Deleted

## 2014-06-01 DIAGNOSIS — F329 Major depressive disorder, single episode, unspecified: Secondary | ICD-10-CM

## 2014-06-01 DIAGNOSIS — C4359 Malignant melanoma of other part of trunk: Secondary | ICD-10-CM

## 2014-06-01 DIAGNOSIS — F32A Depression, unspecified: Secondary | ICD-10-CM

## 2014-06-01 MED ORDER — AMPHETAMINE-DEXTROAMPHETAMINE 10 MG PO TABS
10.0000 mg | ORAL_TABLET | Freq: Every day | ORAL | Status: DC
Start: 2014-06-01 — End: 2014-07-26

## 2014-06-01 MED ORDER — OXYCODONE-ACETAMINOPHEN 10-325 MG PO TABS: 1.0000 | ORAL_TABLET | ORAL | Status: DC | PRN

## 2014-06-07 ENCOUNTER — Other Ambulatory Visit: Payer: Self-pay | Admitting: Internal Medicine

## 2014-06-07 NOTE — Telephone Encounter (Signed)
sch OV 

## 2014-06-28 ENCOUNTER — Ambulatory Visit: Payer: BC Managed Care – PPO | Admitting: Hematology & Oncology

## 2014-06-28 ENCOUNTER — Other Ambulatory Visit: Payer: BC Managed Care – PPO | Admitting: Lab

## 2014-07-08 DIAGNOSIS — E890 Postprocedural hypothyroidism: Secondary | ICD-10-CM | POA: Insufficient documentation

## 2014-07-12 ENCOUNTER — Encounter: Payer: Self-pay | Admitting: *Deleted

## 2014-07-12 ENCOUNTER — Other Ambulatory Visit: Payer: Self-pay | Admitting: Internal Medicine

## 2014-07-12 ENCOUNTER — Telehealth: Payer: Self-pay | Admitting: Hematology & Oncology

## 2014-07-12 NOTE — Progress Notes (Unsigned)
Pt left message that she has a cyst on her chest and back of leg that is hurting.  Before I could report this to Dr. Marin Olp, pt called back and said she had appt with her Dermatologist.

## 2014-07-12 NOTE — Telephone Encounter (Signed)
Pt called said she needed to see MD. I transferred to RN line for triage

## 2014-07-26 ENCOUNTER — Ambulatory Visit (HOSPITAL_BASED_OUTPATIENT_CLINIC_OR_DEPARTMENT_OTHER): Payer: BC Managed Care – PPO | Admitting: Hematology & Oncology

## 2014-07-26 ENCOUNTER — Encounter: Payer: Self-pay | Admitting: Hematology & Oncology

## 2014-07-26 ENCOUNTER — Other Ambulatory Visit (HOSPITAL_BASED_OUTPATIENT_CLINIC_OR_DEPARTMENT_OTHER): Payer: BC Managed Care – PPO | Admitting: Lab

## 2014-07-26 VITALS — BP 139/81 | HR 97 | Temp 98.5°F | Resp 14 | Ht 64.0 in | Wt 107.0 lb

## 2014-07-26 DIAGNOSIS — C4359 Malignant melanoma of other part of trunk: Secondary | ICD-10-CM

## 2014-07-26 DIAGNOSIS — N611 Abscess of the breast and nipple: Secondary | ICD-10-CM

## 2014-07-26 DIAGNOSIS — C7800 Secondary malignant neoplasm of unspecified lung: Secondary | ICD-10-CM

## 2014-07-26 DIAGNOSIS — F329 Major depressive disorder, single episode, unspecified: Secondary | ICD-10-CM

## 2014-07-26 DIAGNOSIS — N61 Mastitis without abscess: Secondary | ICD-10-CM

## 2014-07-26 DIAGNOSIS — F32A Depression, unspecified: Secondary | ICD-10-CM

## 2014-07-26 LAB — CMP (CANCER CENTER ONLY)
ALBUMIN: 3.6 g/dL (ref 3.3–5.5)
ALT(SGPT): 20 U/L (ref 10–47)
AST: 25 U/L (ref 11–38)
Alkaline Phosphatase: 83 U/L (ref 26–84)
BUN: 9 mg/dL (ref 7–22)
CO2: 25 meq/L (ref 18–33)
Calcium: 9 mg/dL (ref 8.0–10.3)
Chloride: 94 mEq/L — ABNORMAL LOW (ref 98–108)
Creat: 0.7 mg/dl (ref 0.6–1.2)
GLUCOSE: 97 mg/dL (ref 73–118)
POTASSIUM: 3.5 meq/L (ref 3.3–4.7)
Sodium: 136 mEq/L (ref 128–145)
Total Bilirubin: 0.8 mg/dl (ref 0.20–1.60)
Total Protein: 7.1 g/dL (ref 6.4–8.1)

## 2014-07-26 LAB — CBC WITH DIFFERENTIAL (CANCER CENTER ONLY)
BASO#: 0 10*3/uL (ref 0.0–0.2)
BASO%: 0.4 % (ref 0.0–2.0)
EOS ABS: 0.2 10*3/uL (ref 0.0–0.5)
EOS%: 2.1 % (ref 0.0–7.0)
HCT: 42.7 % (ref 34.8–46.6)
HGB: 14.9 g/dL (ref 11.6–15.9)
LYMPH#: 1.5 10*3/uL (ref 0.9–3.3)
LYMPH%: 14.8 % (ref 14.0–48.0)
MCH: 29 pg (ref 26.0–34.0)
MCHC: 34.9 g/dL (ref 32.0–36.0)
MCV: 83 fL (ref 81–101)
MONO#: 0.9 10*3/uL (ref 0.1–0.9)
MONO%: 9.3 % (ref 0.0–13.0)
NEUT%: 73.4 % (ref 39.6–80.0)
NEUTROS ABS: 7.2 10*3/uL — AB (ref 1.5–6.5)
Platelets: 330 10*3/uL (ref 145–400)
RBC: 5.13 10*6/uL (ref 3.70–5.32)
RDW: 14.3 % (ref 11.1–15.7)
WBC: 9.8 10*3/uL (ref 3.9–10.0)

## 2014-07-26 LAB — LACTATE DEHYDROGENASE: LDH: 238 U/L (ref 94–250)

## 2014-07-26 MED ORDER — PROMETHAZINE HCL 12.5 MG PO TABS: 12.5000 mg | ORAL_TABLET | Freq: Four times a day (QID) | ORAL | Status: DC | PRN

## 2014-07-26 MED ORDER — OXYCODONE-ACETAMINOPHEN 10-325 MG PO TABS: 1.0000 | ORAL_TABLET | ORAL | Status: DC | PRN

## 2014-07-26 MED ORDER — AMPHETAMINE-DEXTROAMPHETAMINE 10 MG PO TABS: 10.0000 mg | ORAL_TABLET | Freq: Every day | ORAL | Status: DC
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
## 2014-07-28 ENCOUNTER — Other Ambulatory Visit: Payer: Self-pay | Admitting: Hematology & Oncology

## 2014-07-29 ENCOUNTER — Telehealth: Payer: Self-pay | Admitting: Hematology & Oncology

## 2014-07-29 LAB — WOUND CULTURE

## 2014-07-29 NOTE — Telephone Encounter (Signed)
Left pt message to call for details of oct 7 to 15 appointments and that I mailed the schedule and instructions.

## 2014-07-30 ENCOUNTER — Telehealth: Payer: Self-pay

## 2014-07-30 ENCOUNTER — Other Ambulatory Visit: Payer: Self-pay

## 2014-07-30 MED ORDER — LEVOFLOXACIN 750 MG PO TABS: 750.0000 mg | ORAL_TABLET | Freq: Every day | ORAL | Status: DC

## 2014-07-30 NOTE — Telephone Encounter (Addendum)
Message copied by Johny Drilling on Fri Jul 30, 2014 11:26 AM ------      Message from: Burney Gauze R      Created: Thu Jul 29, 2014  6:51 PM       Call - the wound is growing E.Coli!!  She must be on Levaquin 750mg  po qday for 10days.  She MUST get that cyst removed. She needs to go back to her MD and get referred.  Pete ------  This information given to pt by phone who verbalizes understanding. Results routed to Dr Ubaldo Glassing & Dr Jarome Matin. dph

## 2014-08-05 ENCOUNTER — Other Ambulatory Visit: Payer: Self-pay | Admitting: Internal Medicine

## 2014-08-15 ENCOUNTER — Other Ambulatory Visit: Payer: Self-pay | Admitting: Internal Medicine

## 2014-08-19 ENCOUNTER — Encounter: Payer: Self-pay | Admitting: Internal Medicine

## 2014-08-19 ENCOUNTER — Other Ambulatory Visit: Payer: Self-pay | Admitting: *Deleted

## 2014-08-19 ENCOUNTER — Other Ambulatory Visit: Payer: Self-pay | Admitting: Internal Medicine

## 2014-08-19 ENCOUNTER — Ambulatory Visit (INDEPENDENT_AMBULATORY_CARE_PROVIDER_SITE_OTHER): Payer: BC Managed Care – PPO | Admitting: Internal Medicine

## 2014-08-19 ENCOUNTER — Ambulatory Visit (INDEPENDENT_AMBULATORY_CARE_PROVIDER_SITE_OTHER)
Admission: RE | Admit: 2014-08-19 | Discharge: 2014-08-19 | Disposition: A | Discharge status: Home / Self Care | Payer: BC Managed Care – PPO | Source: Ambulatory Visit | Attending: Internal Medicine | Admitting: Internal Medicine

## 2014-08-19 VITALS — BP 140/90 | HR 72 | Temp 98.8°F | Resp 16 | Wt 107.0 lb

## 2014-08-19 DIAGNOSIS — E039 Hypothyroidism, unspecified: Secondary | ICD-10-CM

## 2014-08-19 DIAGNOSIS — M25512 Pain in left shoulder: Secondary | ICD-10-CM

## 2014-08-19 DIAGNOSIS — H6123 Impacted cerumen, bilateral: Secondary | ICD-10-CM

## 2014-08-19 DIAGNOSIS — F419 Anxiety disorder, unspecified: Secondary | ICD-10-CM

## 2014-08-19 DIAGNOSIS — E559 Vitamin D deficiency, unspecified: Secondary | ICD-10-CM

## 2014-08-19 DIAGNOSIS — F411 Generalized anxiety disorder: Secondary | ICD-10-CM

## 2014-08-19 DIAGNOSIS — M542 Cervicalgia: Secondary | ICD-10-CM

## 2014-08-19 DIAGNOSIS — R52 Pain, unspecified: Secondary | ICD-10-CM

## 2014-08-19 DIAGNOSIS — C4359 Malignant melanoma of other part of trunk: Secondary | ICD-10-CM

## 2014-08-19 DIAGNOSIS — M25519 Pain in unspecified shoulder: Secondary | ICD-10-CM

## 2014-08-19 DIAGNOSIS — H6120 Impacted cerumen, unspecified ear: Secondary | ICD-10-CM

## 2014-08-19 DIAGNOSIS — E538 Deficiency of other specified B group vitamins: Secondary | ICD-10-CM

## 2014-08-19 MED ORDER — CLONAZEPAM 1 MG PO TABS: 1.0000 mg | ORAL_TABLET | Freq: Three times a day (TID) | ORAL | Status: DC | PRN

## 2014-08-19 MED ORDER — CYANOCOBALAMIN 1000 MCG/ML IJ SOLN
1000.0000 ug | Freq: Once | INTRAMUSCULAR | Status: AC
  Administered 2014-08-19: 1000 ug via INTRAMUSCULAR

## 2014-08-19 MED ORDER — TRAZODONE HCL 50 MG PO TABS: ORAL_TABLET | ORAL | Status: DC

## 2014-08-19 NOTE — Assessment & Plan Note (Signed)
X ray

## 2014-08-19 NOTE — Assessment & Plan Note (Signed)
Re-start Vit D 

## 2014-08-19 NOTE — Progress Notes (Signed)
Pre visit review using our clinic review tool, if applicable. No additional management support is needed unless otherwise documented below in the visit note. 

## 2014-08-19 NOTE — Assessment & Plan Note (Signed)
Re-start B12 

## 2014-08-19 NOTE — Assessment & Plan Note (Signed)
Continue with current prescription therapy as reflected on the Med list.  

## 2014-08-19 NOTE — Assessment & Plan Note (Signed)
On Rx 
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
## 2014-08-19 NOTE — Assessment & Plan Note (Signed)
See Procedure 

## 2014-08-20 ENCOUNTER — Telehealth: Payer: Self-pay | Admitting: *Deleted

## 2014-08-20 MED ORDER — METHYLPREDNISOLONE 4 MG PO KIT: PACK | ORAL | Status: DC

## 2014-08-20 NOTE — Telephone Encounter (Signed)
Called pt concerning xray results on her shoulder. Pt wanting to know is their anything else she can take to help relieved the pain...Susan Mccarthy

## 2014-08-20 NOTE — Telephone Encounter (Signed)
Call Medrol dosepack in pls; take w/food ROV if not better Thx

## 2014-08-20 NOTE — Telephone Encounter (Signed)
Notified pt with md response medrol pack sent to [facility]..../lmb

## 2014-09-08 ENCOUNTER — Ambulatory Visit (HOSPITAL_COMMUNITY)
Admission: RE | Admit: 2014-09-08 | Discharge: 2014-09-08 | Disposition: A | Discharge status: Home / Self Care | Payer: BC Managed Care – PPO | Source: Ambulatory Visit | Attending: Hematology & Oncology | Admitting: Hematology & Oncology

## 2014-09-08 DIAGNOSIS — C4359 Malignant melanoma of other part of trunk: Secondary | ICD-10-CM | POA: Insufficient documentation

## 2014-09-08 LAB — GLUCOSE, CAPILLARY: GLUCOSE-CAPILLARY: 89 mg/dL (ref 70–99)

## 2014-09-08 MED ORDER — FLUDEOXYGLUCOSE F - 18 (FDG) INJECTION
5.3700 | Freq: Once | INTRAVENOUS | Status: AC | PRN
  Administered 2014-09-08: 5.37 via INTRAVENOUS

## 2014-09-09 ENCOUNTER — Telehealth: Payer: Self-pay | Admitting: *Deleted

## 2014-09-09 ENCOUNTER — Other Ambulatory Visit: Payer: Self-pay | Admitting: *Deleted

## 2014-09-09 DIAGNOSIS — C439 Malignant melanoma of skin, unspecified: Secondary | ICD-10-CM

## 2014-09-09 MED ORDER — VEMURAFENIB 240 MG PO TABS: 960.0000 mg | ORAL_TABLET | Freq: Two times a day (BID) | ORAL | Status: DC

## 2014-09-09 NOTE — Telephone Encounter (Signed)
Call-PET scan shows no obvious melanoma. This is fantastic. You're still in remission.

## 2014-09-16 ENCOUNTER — Encounter: Payer: Self-pay | Admitting: Hematology & Oncology

## 2014-09-16 ENCOUNTER — Other Ambulatory Visit: Payer: Self-pay | Admitting: Nurse Practitioner

## 2014-09-16 ENCOUNTER — Other Ambulatory Visit (HOSPITAL_BASED_OUTPATIENT_CLINIC_OR_DEPARTMENT_OTHER): Payer: BC Managed Care – PPO | Admitting: Lab

## 2014-09-16 ENCOUNTER — Ambulatory Visit (HOSPITAL_BASED_OUTPATIENT_CLINIC_OR_DEPARTMENT_OTHER): Payer: BC Managed Care – PPO

## 2014-09-16 ENCOUNTER — Ambulatory Visit (HOSPITAL_BASED_OUTPATIENT_CLINIC_OR_DEPARTMENT_OTHER): Payer: BC Managed Care – PPO | Admitting: Hematology & Oncology

## 2014-09-16 VITALS — BP 105/74 | HR 84 | Temp 97.4°F | Resp 14 | Ht 64.0 in | Wt 107.0 lb

## 2014-09-16 DIAGNOSIS — C73 Malignant neoplasm of thyroid gland: Secondary | ICD-10-CM

## 2014-09-16 DIAGNOSIS — N611 Abscess of the breast and nipple: Secondary | ICD-10-CM

## 2014-09-16 DIAGNOSIS — E538 Deficiency of other specified B group vitamins: Secondary | ICD-10-CM

## 2014-09-16 DIAGNOSIS — C7800 Secondary malignant neoplasm of unspecified lung: Secondary | ICD-10-CM

## 2014-09-16 DIAGNOSIS — C4359 Malignant melanoma of other part of trunk: Secondary | ICD-10-CM

## 2014-09-16 DIAGNOSIS — Z85820 Personal history of malignant melanoma of skin: Secondary | ICD-10-CM

## 2014-09-16 DIAGNOSIS — Z23 Encounter for immunization: Secondary | ICD-10-CM

## 2014-09-16 DIAGNOSIS — F32A Depression, unspecified: Secondary | ICD-10-CM

## 2014-09-16 DIAGNOSIS — F329 Major depressive disorder, single episode, unspecified: Secondary | ICD-10-CM

## 2014-09-16 DIAGNOSIS — D62 Acute posthemorrhagic anemia: Secondary | ICD-10-CM

## 2014-09-16 LAB — CBC WITH DIFFERENTIAL (CANCER CENTER ONLY)
BASO#: 0 10*3/uL (ref 0.0–0.2)
BASO%: 0.3 % (ref 0.0–2.0)
EOS ABS: 0.2 10*3/uL (ref 0.0–0.5)
EOS%: 2.4 % (ref 0.0–7.0)
HCT: 41.4 % (ref 34.8–46.6)
HEMOGLOBIN: 14.3 g/dL (ref 11.6–15.9)
LYMPH#: 1 10*3/uL (ref 0.9–3.3)
LYMPH%: 14.1 % (ref 14.0–48.0)
MCH: 28.6 pg (ref 26.0–34.0)
MCHC: 34.5 g/dL (ref 32.0–36.0)
MCV: 83 fL (ref 81–101)
MONO#: 0.7 10*3/uL (ref 0.1–0.9)
MONO%: 10.4 % (ref 0.0–13.0)
NEUT%: 72.8 % (ref 39.6–80.0)
NEUTROS ABS: 5 10*3/uL (ref 1.5–6.5)
Platelets: 261 10*3/uL (ref 145–400)
RBC: 5 10*6/uL (ref 3.70–5.32)
RDW: 14.8 % (ref 11.1–15.7)
WBC: 6.8 10*3/uL (ref 3.9–10.0)

## 2014-09-16 LAB — CMP (CANCER CENTER ONLY)
ALBUMIN: 3.2 g/dL — AB (ref 3.3–5.5)
ALT: 23 U/L (ref 10–47)
AST: 20 U/L (ref 11–38)
Alkaline Phosphatase: 88 U/L — ABNORMAL HIGH (ref 26–84)
BUN: 10 mg/dL (ref 7–22)
CALCIUM: 9.1 mg/dL (ref 8.0–10.3)
CHLORIDE: 92 meq/L — AB (ref 98–108)
CO2: 26 meq/L (ref 18–33)
CREATININE: 0.7 mg/dL (ref 0.6–1.2)
Glucose, Bld: 98 mg/dL (ref 73–118)
Potassium: 3.7 mEq/L (ref 3.3–4.7)
Sodium: 135 mEq/L (ref 128–145)
Total Bilirubin: 0.6 mg/dl (ref 0.20–1.60)
Total Protein: 6.7 g/dL (ref 6.4–8.1)

## 2014-09-16 LAB — LACTATE DEHYDROGENASE: LDH: 183 U/L (ref 94–250)

## 2014-09-16 MED ORDER — AMPHETAMINE-DEXTROAMPHETAMINE 10 MG PO TABS: 10.0000 mg | ORAL_TABLET | Freq: Every day | ORAL | Status: DC

## 2014-09-16 MED ORDER — INFLUENZA VAC SPLIT QUAD 0.5 ML IM SUSY
0.5000 mL | PREFILLED_SYRINGE | INTRAMUSCULAR | Status: AC
  Administered 2014-09-16: 0.5 mL via INTRAMUSCULAR
  Filled 2014-09-16: qty 0.5

## 2014-09-16 MED ORDER — OXYCODONE-ACETAMINOPHEN 10-325 MG PO TABS: 1.0000 | ORAL_TABLET | ORAL | Status: DC | PRN

## 2014-09-16 MED ORDER — CELECOXIB 100 MG PO CAPS
100.0000 mg | ORAL_CAPSULE | Freq: Two times a day (BID) | ORAL | Status: DC
Start: 2014-09-16 — End: 2015-03-10
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
## 2014-10-04 ENCOUNTER — Encounter: Payer: Self-pay | Admitting: Hematology & Oncology

## 2014-10-25 ENCOUNTER — Other Ambulatory Visit: Payer: Self-pay | Admitting: *Deleted

## 2014-10-25 DIAGNOSIS — F329 Major depressive disorder, single episode, unspecified: Secondary | ICD-10-CM

## 2014-10-25 DIAGNOSIS — E538 Deficiency of other specified B group vitamins: Secondary | ICD-10-CM

## 2014-10-25 DIAGNOSIS — F32A Depression, unspecified: Secondary | ICD-10-CM

## 2014-10-25 DIAGNOSIS — C4359 Malignant melanoma of other part of trunk: Secondary | ICD-10-CM

## 2014-10-25 DIAGNOSIS — C7800 Secondary malignant neoplasm of unspecified lung: Secondary | ICD-10-CM

## 2014-10-25 DIAGNOSIS — N611 Abscess of the breast and nipple: Secondary | ICD-10-CM

## 2014-10-25 MED ORDER — AMPHETAMINE-DEXTROAMPHETAMINE 10 MG PO TABS: 10.0000 mg | ORAL_TABLET | Freq: Every day | ORAL | Status: DC

## 2014-10-25 MED ORDER — OXYCODONE-ACETAMINOPHEN 10-325 MG PO TABS: 1.0000 | ORAL_TABLET | ORAL | Status: DC | PRN

## 2014-10-29 ENCOUNTER — Other Ambulatory Visit: Payer: Self-pay | Admitting: *Deleted

## 2014-10-29 DIAGNOSIS — C73 Malignant neoplasm of thyroid gland: Secondary | ICD-10-CM

## 2014-10-29 MED ORDER — LEVOTHYROXINE SODIUM 100 MCG PO TABS: 100.0000 ug | ORAL_TABLET | Freq: Every day | ORAL | Status: DC

## 2014-11-23 ENCOUNTER — Ambulatory Visit: Payer: BC Managed Care – PPO | Admitting: Internal Medicine

## 2014-11-30 ENCOUNTER — Other Ambulatory Visit: Payer: Self-pay | Admitting: *Deleted

## 2014-11-30 DIAGNOSIS — F32A Depression, unspecified: Secondary | ICD-10-CM

## 2014-11-30 DIAGNOSIS — C4359 Malignant melanoma of other part of trunk: Secondary | ICD-10-CM

## 2014-11-30 DIAGNOSIS — N611 Abscess of the breast and nipple: Secondary | ICD-10-CM

## 2014-11-30 DIAGNOSIS — F329 Major depressive disorder, single episode, unspecified: Secondary | ICD-10-CM

## 2014-11-30 DIAGNOSIS — C7800 Secondary malignant neoplasm of unspecified lung: Secondary | ICD-10-CM

## 2014-11-30 DIAGNOSIS — E538 Deficiency of other specified B group vitamins: Secondary | ICD-10-CM

## 2014-11-30 MED ORDER — AMPHETAMINE-DEXTROAMPHETAMINE 10 MG PO TABS: 10.0000 mg | ORAL_TABLET | Freq: Every day | ORAL | Status: DC

## 2014-11-30 MED ORDER — OXYCODONE-ACETAMINOPHEN 10-325 MG PO TABS: 1.0000 | ORAL_TABLET | ORAL | Status: DC | PRN

## 2014-12-19 ENCOUNTER — Other Ambulatory Visit: Payer: Self-pay | Admitting: Internal Medicine

## 2014-12-21 ENCOUNTER — Ambulatory Visit: Payer: BC Managed Care – PPO | Admitting: Internal Medicine

## 2014-12-27 ENCOUNTER — Other Ambulatory Visit: Payer: Self-pay | Admitting: Internal Medicine

## 2015-01-02 ENCOUNTER — Other Ambulatory Visit: Payer: Self-pay | Admitting: Internal Medicine

## 2015-01-04 NOTE — Telephone Encounter (Signed)
Called pharmacy had to leave refill info on pharmacy vm...Susan Mccarthy

## 2015-01-14 ENCOUNTER — Other Ambulatory Visit: Payer: Self-pay

## 2015-01-14 DIAGNOSIS — E538 Deficiency of other specified B group vitamins: Secondary | ICD-10-CM

## 2015-01-14 DIAGNOSIS — F32A Depression, unspecified: Secondary | ICD-10-CM

## 2015-01-14 DIAGNOSIS — C7800 Secondary malignant neoplasm of unspecified lung: Secondary | ICD-10-CM

## 2015-01-14 DIAGNOSIS — F329 Major depressive disorder, single episode, unspecified: Secondary | ICD-10-CM

## 2015-01-14 DIAGNOSIS — C4359 Malignant melanoma of other part of trunk: Secondary | ICD-10-CM

## 2015-01-14 DIAGNOSIS — N611 Abscess of the breast and nipple: Secondary | ICD-10-CM

## 2015-01-14 MED ORDER — AMPHETAMINE-DEXTROAMPHETAMINE 10 MG PO TABS: 10.0000 mg | ORAL_TABLET | Freq: Every day | ORAL | Status: DC

## 2015-01-14 MED ORDER — OXYCODONE-ACETAMINOPHEN 10-325 MG PO TABS: 1.0000 | ORAL_TABLET | ORAL | Status: DC | PRN

## 2015-01-20 ENCOUNTER — Other Ambulatory Visit: Payer: Self-pay | Admitting: *Deleted

## 2015-01-20 DIAGNOSIS — C439 Malignant melanoma of skin, unspecified: Secondary | ICD-10-CM

## 2015-01-20 MED ORDER — VEMURAFENIB 240 MG PO TABS: 960.0000 mg | ORAL_TABLET | Freq: Two times a day (BID) | ORAL | Status: DC

## 2015-02-02 ENCOUNTER — Ambulatory Visit (HOSPITAL_COMMUNITY): Payer: BC Managed Care – PPO

## 2015-02-04 ENCOUNTER — Ambulatory Visit (HOSPITAL_COMMUNITY): Admission: RE | Admit: 2015-02-04 | Payer: BLUE CROSS/BLUE SHIELD | Source: Ambulatory Visit

## 2015-02-09 ENCOUNTER — Ambulatory Visit: Payer: BC Managed Care – PPO | Admitting: Hematology & Oncology

## 2015-02-09 ENCOUNTER — Other Ambulatory Visit: Payer: BLUE CROSS/BLUE SHIELD | Admitting: Lab

## 2015-02-11 ENCOUNTER — Encounter (HOSPITAL_COMMUNITY): Payer: BLUE CROSS/BLUE SHIELD

## 2015-02-23 ENCOUNTER — Encounter (HOSPITAL_COMMUNITY): Payer: BLUE CROSS/BLUE SHIELD

## 2015-02-24 ENCOUNTER — Other Ambulatory Visit: Payer: BLUE CROSS/BLUE SHIELD

## 2015-02-24 ENCOUNTER — Ambulatory Visit: Payer: BLUE CROSS/BLUE SHIELD | Admitting: Hematology & Oncology

## 2015-03-03 ENCOUNTER — Ambulatory Visit (HOSPITAL_COMMUNITY): Payer: BLUE CROSS/BLUE SHIELD

## 2015-03-10 ENCOUNTER — Other Ambulatory Visit: Payer: Self-pay | Admitting: Hematology & Oncology

## 2015-03-14 ENCOUNTER — Ambulatory Visit: Payer: BLUE CROSS/BLUE SHIELD | Admitting: Hematology & Oncology

## 2015-03-14 ENCOUNTER — Other Ambulatory Visit: Payer: BLUE CROSS/BLUE SHIELD

## 2015-03-14 ENCOUNTER — Other Ambulatory Visit: Payer: Self-pay | Admitting: *Deleted

## 2015-03-14 MED ORDER — DICLOFENAC SODIUM 1 % TD GEL: TRANSDERMAL | Status: DC

## 2015-03-15 ENCOUNTER — Encounter (HOSPITAL_COMMUNITY): Admission: RE | Admit: 2015-03-15 | Payer: BLUE CROSS/BLUE SHIELD | Source: Ambulatory Visit

## 2015-03-16 ENCOUNTER — Telehealth: Payer: Self-pay | Admitting: Hematology & Oncology

## 2015-03-16 ENCOUNTER — Other Ambulatory Visit: Payer: Self-pay | Admitting: Internal Medicine

## 2015-03-16 NOTE — Telephone Encounter (Signed)
Pt moved PET to 4-21 and MD to 5-9. I offered other dates for MD appointments but he wouldn't take 8 am appointments

## 2015-03-17 ENCOUNTER — Other Ambulatory Visit: Payer: Self-pay | Admitting: *Deleted

## 2015-03-17 DIAGNOSIS — F329 Major depressive disorder, single episode, unspecified: Secondary | ICD-10-CM

## 2015-03-17 DIAGNOSIS — N611 Abscess of the breast and nipple: Secondary | ICD-10-CM

## 2015-03-17 DIAGNOSIS — E538 Deficiency of other specified B group vitamins: Secondary | ICD-10-CM

## 2015-03-17 DIAGNOSIS — F32A Depression, unspecified: Secondary | ICD-10-CM

## 2015-03-17 DIAGNOSIS — C4359 Malignant melanoma of other part of trunk: Secondary | ICD-10-CM

## 2015-03-17 DIAGNOSIS — C7800 Secondary malignant neoplasm of unspecified lung: Secondary | ICD-10-CM

## 2015-03-17 MED ORDER — OXYCODONE-ACETAMINOPHEN 10-325 MG PO TABS
1.0000 | ORAL_TABLET | ORAL | Status: DC | PRN
Start: 2015-03-17 — End: 2015-05-05

## 2015-03-17 MED ORDER — AMPHETAMINE-DEXTROAMPHETAMINE 10 MG PO TABS: 10.0000 mg | ORAL_TABLET | Freq: Every day | ORAL | Status: DC

## 2015-03-17 NOTE — Telephone Encounter (Signed)
rx called in by dr Alain Marion

## 2015-03-22 ENCOUNTER — Ambulatory Visit: Payer: BLUE CROSS/BLUE SHIELD | Admitting: Hematology & Oncology

## 2015-03-22 ENCOUNTER — Other Ambulatory Visit: Payer: BLUE CROSS/BLUE SHIELD

## 2015-03-23 ENCOUNTER — Other Ambulatory Visit: Payer: Self-pay | Admitting: *Deleted

## 2015-03-23 ENCOUNTER — Ambulatory Visit (HOSPITAL_COMMUNITY)
Admission: RE | Admit: 2015-03-23 | Discharge: 2015-03-23 | Disposition: A | Discharge status: Home / Self Care | Payer: BLUE CROSS/BLUE SHIELD | Source: Ambulatory Visit | Attending: Hematology & Oncology | Admitting: Hematology & Oncology

## 2015-03-23 ENCOUNTER — Telehealth: Payer: Self-pay | Admitting: *Deleted

## 2015-03-23 DIAGNOSIS — K5730 Diverticulosis of large intestine without perforation or abscess without bleeding: Secondary | ICD-10-CM | POA: Diagnosis not present

## 2015-03-23 DIAGNOSIS — C4359 Malignant melanoma of other part of trunk: Secondary | ICD-10-CM

## 2015-03-23 DIAGNOSIS — Z08 Encounter for follow-up examination after completed treatment for malignant neoplasm: Secondary | ICD-10-CM | POA: Insufficient documentation

## 2015-03-23 LAB — GLUCOSE, CAPILLARY: Glucose-Capillary: 94 mg/dL (ref 70–99)

## 2015-03-23 MED ORDER — FLUDEOXYGLUCOSE F - 18 (FDG) INJECTION
5.6000 | Freq: Once | INTRAVENOUS | Status: AC | PRN
  Administered 2015-03-23: 5.6 via INTRAVENOUS

## 2015-03-23 NOTE — Telephone Encounter (Signed)
Voltaren 1 % gel PA submitted via CoverMyMeds.  Pt [employer] of [place] ID is [id number].

## 2015-03-24 ENCOUNTER — Ambulatory Visit (HOSPITAL_COMMUNITY): Payer: BLUE CROSS/BLUE SHIELD

## 2015-03-24 ENCOUNTER — Telehealth: Payer: Self-pay

## 2015-03-24 NOTE — Telephone Encounter (Addendum)
-----   Message from Volanda Napoleon, MD sent at 03/23/2015  4:51 PM EDT ----- Call - NO melanoma on PET scan!!!  Great!!!  Laurey Arrow  Generic message left on non personalized VM to contact our office for results. dph

## 2015-03-31 ENCOUNTER — Telehealth: Payer: Self-pay | Admitting: *Deleted

## 2015-03-31 NOTE — Telephone Encounter (Signed)
Voltaren 1 % gel PA is denied. Pharmacy informed. See denial letter in chart under "media".

## 2015-04-09 ENCOUNTER — Other Ambulatory Visit: Payer: Self-pay | Admitting: Internal Medicine

## 2015-04-10 ENCOUNTER — Other Ambulatory Visit: Payer: Self-pay | Admitting: Internal Medicine

## 2015-04-11 ENCOUNTER — Other Ambulatory Visit (HOSPITAL_BASED_OUTPATIENT_CLINIC_OR_DEPARTMENT_OTHER): Payer: BLUE CROSS/BLUE SHIELD

## 2015-04-11 ENCOUNTER — Other Ambulatory Visit: Payer: Self-pay | Admitting: Internal Medicine

## 2015-04-11 ENCOUNTER — Ambulatory Visit (HOSPITAL_BASED_OUTPATIENT_CLINIC_OR_DEPARTMENT_OTHER): Payer: BLUE CROSS/BLUE SHIELD | Admitting: Hematology & Oncology

## 2015-04-11 ENCOUNTER — Encounter: Payer: Self-pay | Admitting: Hematology & Oncology

## 2015-04-11 VITALS — BP 84/65 | HR 97 | Temp 98.2°F | Resp 16 | Ht 64.0 in | Wt 111.0 lb

## 2015-04-11 DIAGNOSIS — F32A Depression, unspecified: Secondary | ICD-10-CM

## 2015-04-11 DIAGNOSIS — Z85820 Personal history of malignant melanoma of skin: Secondary | ICD-10-CM

## 2015-04-11 DIAGNOSIS — C73 Malignant neoplasm of thyroid gland: Secondary | ICD-10-CM | POA: Diagnosis not present

## 2015-04-11 DIAGNOSIS — C4359 Malignant melanoma of other part of trunk: Secondary | ICD-10-CM

## 2015-04-11 DIAGNOSIS — D510 Vitamin B12 deficiency anemia due to intrinsic factor deficiency: Secondary | ICD-10-CM

## 2015-04-11 DIAGNOSIS — N611 Abscess of the breast and nipple: Secondary | ICD-10-CM

## 2015-04-11 DIAGNOSIS — F329 Major depressive disorder, single episode, unspecified: Secondary | ICD-10-CM

## 2015-04-11 DIAGNOSIS — C7800 Secondary malignant neoplasm of unspecified lung: Secondary | ICD-10-CM

## 2015-04-11 DIAGNOSIS — E538 Deficiency of other specified B group vitamins: Secondary | ICD-10-CM

## 2015-04-11 LAB — CBC WITH DIFFERENTIAL (CANCER CENTER ONLY)
BASO#: 0 10*3/uL (ref 0.0–0.2)
BASO%: 0.3 % (ref 0.0–2.0)
EOS%: 1.2 % (ref 0.0–7.0)
Eosinophils Absolute: 0.1 10*3/uL (ref 0.0–0.5)
HEMATOCRIT: 44.2 % (ref 34.8–46.6)
HGB: 15.4 g/dL (ref 11.6–15.9)
LYMPH#: 1.5 10*3/uL (ref 0.9–3.3)
LYMPH%: 17.2 % (ref 14.0–48.0)
MCH: 28.6 pg (ref 26.0–34.0)
MCHC: 34.8 g/dL (ref 32.0–36.0)
MCV: 82 fL (ref 81–101)
MONO#: 0.8 10*3/uL (ref 0.1–0.9)
MONO%: 8.5 % (ref 0.0–13.0)
NEUT#: 6.5 10*3/uL (ref 1.5–6.5)
NEUT%: 72.8 % (ref 39.6–80.0)
Platelets: 303 10*3/uL (ref 145–400)
RBC: 5.39 10*6/uL — ABNORMAL HIGH (ref 3.70–5.32)
RDW: 14.1 % (ref 11.1–15.7)
WBC: 9 10*3/uL (ref 3.9–10.0)

## 2015-04-11 LAB — CMP (CANCER CENTER ONLY)
ALBUMIN: 3.4 g/dL (ref 3.3–5.5)
ALT: 19 U/L (ref 10–47)
AST: 24 U/L (ref 11–38)
Alkaline Phosphatase: 71 U/L (ref 26–84)
BILIRUBIN TOTAL: 0.6 mg/dL (ref 0.20–1.60)
BUN, Bld: 13 mg/dL (ref 7–22)
CALCIUM: 9.5 mg/dL (ref 8.0–10.3)
CO2: 28 meq/L (ref 18–33)
Chloride: 96 mEq/L — ABNORMAL LOW (ref 98–108)
Creat: 1.3 mg/dl — ABNORMAL HIGH (ref 0.6–1.2)
Glucose, Bld: 98 mg/dL (ref 73–118)
Potassium: 3.9 mEq/L (ref 3.3–4.7)
Sodium: 135 mEq/L (ref 128–145)
TOTAL PROTEIN: 7 g/dL (ref 6.4–8.1)

## 2015-04-11 LAB — LACTATE DEHYDROGENASE: LDH: 226 U/L (ref 94–250)

## 2015-04-11 MED ORDER — CYANOCOBALAMIN 1000 MCG/ML IJ SOLN: 1000.0000 ug | INTRAMUSCULAR | Status: DC

## 2015-04-11 MED ORDER — PANCRELIPASE (LIP-PROT-AMYL) 12000-38000 UNITS PO CPEP: 12000.0000 [IU] | ORAL_CAPSULE | Freq: Three times a day (TID) | ORAL | Status: DC

## 2015-04-11 MED ORDER — DIPHENOXYLATE-ATROPINE 2.5-0.025 MG PO TABS: 1.0000 | ORAL_TABLET | Freq: Four times a day (QID) | ORAL | Status: DC | PRN

## 2015-04-11 NOTE — Telephone Encounter (Signed)
Ok to Rf in PCP's absence? Thanks! 

## 2015-04-11 NOTE — Telephone Encounter (Signed)
Printed Rx faxed to pharmacy.
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
## 2015-04-12 ENCOUNTER — Telehealth: Payer: Self-pay | Admitting: *Deleted

## 2015-04-12 LAB — T3 UPTAKE: T3 UPTAKE: 26 % (ref 22–35)

## 2015-04-12 LAB — TSH CHCC: TSH: 3.85 m[IU]/L (ref 0.308–3.960)

## 2015-04-12 LAB — T4: T4, Total: 13.3 ug/dL — ABNORMAL HIGH (ref 4.5–12.0)

## 2015-04-12 LAB — VITAMIN D 25 HYDROXY (VIT D DEFICIENCY, FRACTURES): Vit D, 25-Hydroxy: 15 ng/mL — ABNORMAL LOW (ref 30–100)

## 2015-04-12 NOTE — Telephone Encounter (Addendum)
Spoke with patient about results. She understands the need to start the vit D supplement.   ----- Message from Volanda Napoleon, MD sent at 04/12/2015  6:54 AM EDT ----- Call - her vit D level is very low!!! She needs to be taking 2000units a day. Thanks!!  Laurey Arrow  Please call and tell her that the thyroid levels are okay. Thanks

## 2015-04-25 ENCOUNTER — Telehealth: Payer: Self-pay | Admitting: Internal Medicine

## 2015-04-25 MED ORDER — DICLOFENAC SODIUM 1 % TD GEL: TRANSDERMAL | Status: DC

## 2015-04-25 NOTE — Telephone Encounter (Signed)
Needs Voltaren gel Rx

## 2015-05-04 ENCOUNTER — Telehealth: Payer: Self-pay | Admitting: *Deleted

## 2015-05-04 NOTE — Telephone Encounter (Signed)
Rec fax stating Voltaren PA is denied. It states Votaren is approved when it is NOT being used to treat hip, spine or shoulder pain. In this case, the patient is using Voltaren gel to treat one of the following. Please advise.

## 2015-05-04 NOTE — Telephone Encounter (Signed)
PA for Voltaren

## 2015-05-04 NOTE — Telephone Encounter (Signed)
Voltaren gel is indicated for knee OA Thx

## 2015-05-05 ENCOUNTER — Other Ambulatory Visit: Payer: Self-pay | Admitting: *Deleted

## 2015-05-05 DIAGNOSIS — N611 Abscess of the breast and nipple: Secondary | ICD-10-CM

## 2015-05-05 DIAGNOSIS — E538 Deficiency of other specified B group vitamins: Secondary | ICD-10-CM

## 2015-05-05 DIAGNOSIS — C7800 Secondary malignant neoplasm of unspecified lung: Secondary | ICD-10-CM

## 2015-05-05 DIAGNOSIS — F329 Major depressive disorder, single episode, unspecified: Secondary | ICD-10-CM

## 2015-05-05 DIAGNOSIS — C4359 Malignant melanoma of other part of trunk: Secondary | ICD-10-CM

## 2015-05-05 DIAGNOSIS — F32A Depression, unspecified: Secondary | ICD-10-CM

## 2015-05-05 MED ORDER — AMPHETAMINE-DEXTROAMPHETAMINE 10 MG PO TABS: 10.0000 mg | ORAL_TABLET | Freq: Every day | ORAL | Status: DC

## 2015-05-05 MED ORDER — OXYCODONE-ACETAMINOPHEN 10-325 MG PO TABS
1.0000 | ORAL_TABLET | ORAL | Status: DC | PRN
Start: 2015-05-05 — End: 2015-07-13

## 2015-05-09 ENCOUNTER — Other Ambulatory Visit: Payer: Self-pay | Admitting: Internal Medicine

## 2015-05-10 NOTE — Telephone Encounter (Signed)
Notified pharmacy spoke with Jim/pharmacist gave md approval.../lmb

## 2015-05-10 NOTE — Telephone Encounter (Signed)
sch OV 

## 2015-05-19 NOTE — Telephone Encounter (Signed)
Appeal letter printed/holding for MD signature. Will fax to [facility] of [place] appeals dept at [phone]

## 2015-05-26 ENCOUNTER — Other Ambulatory Visit: Payer: Self-pay | Admitting: *Deleted

## 2015-05-26 DIAGNOSIS — C439 Malignant melanoma of skin, unspecified: Secondary | ICD-10-CM

## 2015-05-26 MED ORDER — VEMURAFENIB 240 MG PO TABS: 960.0000 mg | ORAL_TABLET | Freq: Two times a day (BID) | ORAL | Status: DC

## 2015-05-30 ENCOUNTER — Telehealth: Payer: Self-pay | Admitting: *Deleted

## 2015-05-30 NOTE — Telephone Encounter (Signed)
Noted. Thx.

## 2015-05-30 NOTE — Telephone Encounter (Signed)
After faxing an appeal letter for the denial of Voltaren 1% gel I received a fax stating the appeal for coverage of Voltaren remains denied. Please advise.

## 2015-06-05 ENCOUNTER — Other Ambulatory Visit: Payer: Self-pay | Admitting: Hematology & Oncology

## 2015-06-15 ENCOUNTER — Telehealth: Payer: Self-pay | Admitting: *Deleted

## 2015-06-15 NOTE — Telephone Encounter (Signed)
Received notice from [facility] that they are unable to reach patient to arrange for delivery of her Zelboraf. They requested that the office make a contact attempt. Patient was called with no answer. Message was left on voice mail.

## 2015-07-05 ENCOUNTER — Other Ambulatory Visit: Payer: Self-pay | Admitting: Internal Medicine

## 2015-07-05 NOTE — Telephone Encounter (Signed)
#  30  1 tid prn only until PCP back

## 2015-07-05 NOTE — Telephone Encounter (Signed)
Ok to Rf in PCP's absence? Thanks! Fax to [phone]

## 2015-07-06 NOTE — Telephone Encounter (Signed)
Ok to Rf in PCP's absence? Thanks! 

## 2015-07-08 ENCOUNTER — Other Ambulatory Visit: Payer: Self-pay | Admitting: *Deleted

## 2015-07-08 DIAGNOSIS — C73 Malignant neoplasm of thyroid gland: Secondary | ICD-10-CM

## 2015-07-08 DIAGNOSIS — C4359 Malignant melanoma of other part of trunk: Secondary | ICD-10-CM

## 2015-07-11 ENCOUNTER — Ambulatory Visit: Payer: BLUE CROSS/BLUE SHIELD | Admitting: Hematology & Oncology

## 2015-07-11 ENCOUNTER — Other Ambulatory Visit: Payer: BLUE CROSS/BLUE SHIELD

## 2015-07-13 ENCOUNTER — Other Ambulatory Visit: Payer: Self-pay | Admitting: *Deleted

## 2015-07-13 DIAGNOSIS — N611 Abscess of the breast and nipple: Secondary | ICD-10-CM

## 2015-07-13 DIAGNOSIS — F329 Major depressive disorder, single episode, unspecified: Secondary | ICD-10-CM

## 2015-07-13 DIAGNOSIS — C4359 Malignant melanoma of other part of trunk: Secondary | ICD-10-CM

## 2015-07-13 DIAGNOSIS — E538 Deficiency of other specified B group vitamins: Secondary | ICD-10-CM

## 2015-07-13 DIAGNOSIS — C7800 Secondary malignant neoplasm of unspecified lung: Secondary | ICD-10-CM

## 2015-07-13 DIAGNOSIS — F32A Depression, unspecified: Secondary | ICD-10-CM

## 2015-07-13 MED ORDER — OXYCODONE-ACETAMINOPHEN 10-325 MG PO TABS
1.0000 | ORAL_TABLET | ORAL | Status: DC | PRN
Start: 2015-07-13 — End: 2015-08-31

## 2015-07-13 MED ORDER — AMPHETAMINE-DEXTROAMPHETAMINE 10 MG PO TABS: 10.0000 mg | ORAL_TABLET | Freq: Every day | ORAL | Status: DC

## 2015-08-03 ENCOUNTER — Other Ambulatory Visit: Payer: Self-pay

## 2015-08-03 NOTE — Telephone Encounter (Signed)
Ok to RF? 

## 2015-08-04 MED ORDER — CLONAZEPAM 1 MG PO TABS: ORAL_TABLET | ORAL | Status: DC

## 2015-08-04 NOTE — Telephone Encounter (Signed)
Needs OV.  

## 2015-08-04 NOTE — Telephone Encounter (Signed)
Pt called regarding this refill and are wanting to pick it up right away.

## 2015-08-15 ENCOUNTER — Ambulatory Visit: Payer: BLUE CROSS/BLUE SHIELD | Admitting: Hematology & Oncology

## 2015-08-15 ENCOUNTER — Other Ambulatory Visit: Payer: BLUE CROSS/BLUE SHIELD

## 2015-08-22 ENCOUNTER — Ambulatory Visit (INDEPENDENT_AMBULATORY_CARE_PROVIDER_SITE_OTHER): Payer: BLUE CROSS/BLUE SHIELD | Admitting: Internal Medicine

## 2015-08-22 ENCOUNTER — Other Ambulatory Visit (INDEPENDENT_AMBULATORY_CARE_PROVIDER_SITE_OTHER): Payer: BLUE CROSS/BLUE SHIELD

## 2015-08-22 ENCOUNTER — Encounter: Payer: Self-pay | Admitting: Internal Medicine

## 2015-08-22 VITALS — BP 108/60 | HR 83 | Ht 63.75 in | Wt 104.0 lb

## 2015-08-22 DIAGNOSIS — F32A Depression, unspecified: Secondary | ICD-10-CM

## 2015-08-22 DIAGNOSIS — E538 Deficiency of other specified B group vitamins: Secondary | ICD-10-CM

## 2015-08-22 DIAGNOSIS — F329 Major depressive disorder, single episode, unspecified: Secondary | ICD-10-CM

## 2015-08-22 DIAGNOSIS — M25512 Pain in left shoulder: Secondary | ICD-10-CM

## 2015-08-22 DIAGNOSIS — E890 Postprocedural hypothyroidism: Secondary | ICD-10-CM | POA: Diagnosis not present

## 2015-08-22 DIAGNOSIS — H6123 Impacted cerumen, bilateral: Secondary | ICD-10-CM

## 2015-08-22 DIAGNOSIS — E559 Vitamin D deficiency, unspecified: Secondary | ICD-10-CM

## 2015-08-22 DIAGNOSIS — Z23 Encounter for immunization: Secondary | ICD-10-CM

## 2015-08-22 LAB — CBC WITH DIFFERENTIAL/PLATELET
BASOS PCT: 0.4 % (ref 0.0–3.0)
Basophils Absolute: 0 10*3/uL (ref 0.0–0.1)
EOS ABS: 0.1 10*3/uL (ref 0.0–0.7)
EOS PCT: 1.6 % (ref 0.0–5.0)
HCT: 44.1 % (ref 36.0–46.0)
Hemoglobin: 14.6 g/dL (ref 12.0–15.0)
LYMPHS ABS: 1.7 10*3/uL (ref 0.7–4.0)
Lymphocytes Relative: 21.2 % (ref 12.0–46.0)
MCHC: 33.1 g/dL (ref 30.0–36.0)
MCV: 85.4 fl (ref 78.0–100.0)
MONO ABS: 0.6 10*3/uL (ref 0.1–1.0)
Monocytes Relative: 8 % (ref 3.0–12.0)
NEUTROS PCT: 68.8 % (ref 43.0–77.0)
Neutro Abs: 5.5 10*3/uL (ref 1.4–7.7)
PLATELETS: 243 10*3/uL (ref 150.0–400.0)
RBC: 5.17 Mil/uL — ABNORMAL HIGH (ref 3.87–5.11)
RDW: 14.8 % (ref 11.5–15.5)
WBC: 8 10*3/uL (ref 4.0–10.5)

## 2015-08-22 LAB — URINALYSIS
Bilirubin Urine: NEGATIVE
HGB URINE DIPSTICK: NEGATIVE
Ketones, ur: NEGATIVE
LEUKOCYTES UA: NEGATIVE
NITRITE: NEGATIVE
PH: 7 (ref 5.0–8.0)
Specific Gravity, Urine: 1.01 (ref 1.000–1.030)
TOTAL PROTEIN, URINE-UPE24: NEGATIVE
Urine Glucose: NEGATIVE
Urobilinogen, UA: 0.2 (ref 0.0–1.0)

## 2015-08-22 LAB — BASIC METABOLIC PANEL
BUN: 9 mg/dL (ref 6–23)
CALCIUM: 9.1 mg/dL (ref 8.4–10.5)
CO2: 28 mEq/L (ref 19–32)
Chloride: 96 mEq/L (ref 96–112)
Creatinine, Ser: 0.73 mg/dL (ref 0.40–1.20)
GFR: 84.63 mL/min (ref >60.00)
Glucose, Bld: 109 mg/dL — ABNORMAL HIGH (ref 70–99)
POTASSIUM: 4.3 meq/L (ref 3.5–5.1)
SODIUM: 130 meq/L — AB (ref 135–145)

## 2015-08-22 LAB — HEPATIC FUNCTION PANEL
ALK PHOS: 91 U/L (ref 39–117)
ALT: 10 U/L (ref 0–35)
AST: 15 U/L (ref 0–37)
Albumin: 4.1 g/dL (ref 3.5–5.2)
BILIRUBIN DIRECT: 0.1 mg/dL (ref 0.0–0.3)
BILIRUBIN TOTAL: 0.5 mg/dL (ref 0.2–1.2)
Total Protein: 6.9 g/dL (ref 6.0–8.3)

## 2015-08-22 LAB — LIPID PANEL
CHOLESTEROL: 301 mg/dL — AB (ref 0–200)
HDL: 66.4 mg/dL (ref >39.00)
LDL CALC: 213 mg/dL — AB (ref 0–99)
NonHDL: 234.55
TRIGLYCERIDES: 107 mg/dL (ref 0.0–149.0)
Total CHOL/HDL Ratio: 5
VLDL: 21.4 mg/dL (ref 0.0–40.0)

## 2015-08-22 MED ORDER — METHYLPREDNISOLONE ACETATE 40 MG/ML IJ SUSP
40.0000 mg | Freq: Once | INTRAMUSCULAR | Status: AC
  Administered 2015-08-23: 40 mg via INTRA_ARTICULAR

## 2015-08-22 MED ORDER — VORTIOXETINE HBR 10 MG PO TABS: ORAL_TABLET | ORAL | Status: DC

## 2015-08-22 NOTE — Patient Instructions (Signed)
Postprocedure instructions :    A Band-Aid should be left on for 12 hours. Injection therapy is not a cure itself. It is used in conjunction with other modalities. You can use nonsteroidal anti-inflammatories like ibuprofen , hot and cold compresses. Rest is recommended in the next 24 hours. You need to report immediately  if fever, chills or any signs of infection develop. 
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
## 2015-08-22 NOTE — Assessment & Plan Note (Signed)
Risks associated with treatment noncompliance were discussed. Compliance was encouraged. Labs  

## 2015-08-22 NOTE — Progress Notes (Signed)
Pre visit review using our clinic review tool, if applicable. No additional management support is needed unless otherwise documented below in the visit note. 

## 2015-08-22 NOTE — Assessment & Plan Note (Signed)
Off Vit D per [facility]

## 2015-08-22 NOTE — Assessment & Plan Note (Signed)
Labs

## 2015-08-22 NOTE — Assessment & Plan Note (Signed)
9/16 worse: d/c fluoxetine, start Trintellix

## 2015-08-22 NOTE — Assessment & Plan Note (Signed)
Will irrigate 

## 2015-08-22 NOTE — Assessment & Plan Note (Signed)
Will inject °

## 2015-08-23 LAB — T4, FREE: Free T4: 1.1 ng/dL (ref 0.60–1.60)

## 2015-08-23 LAB — VITAMIN D 25 HYDROXY (VIT D DEFICIENCY, FRACTURES)
VITD: 28.88 ng/mL — ABNORMAL LOW (ref 30.00–100.00)
Vit D, 25-Hydroxy: 34 ng/mL (ref 30–100)

## 2015-08-23 LAB — TSH: TSH: 1.62 u[IU]/mL (ref 0.35–4.50)

## 2015-08-23 LAB — CORTISOL: Cortisol, Plasma: 10.8 ug/dL

## 2015-08-23 LAB — VITAMIN B12: Vitamin B-12: 260 pg/mL (ref 211–911)

## 2015-08-24 ENCOUNTER — Other Ambulatory Visit: Payer: Self-pay | Admitting: Internal Medicine

## 2015-08-24 MED ORDER — VITAMIN D3 50 MCG (2000 UT) PO CAPS: 2000.0000 [IU] | ORAL_CAPSULE | Freq: Every day | ORAL | Status: DC

## 2015-08-25 ENCOUNTER — Telehealth: Payer: Self-pay | Admitting: Internal Medicine

## 2015-08-25 NOTE — Telephone Encounter (Signed)
Pt stated understanding and wants to know if you would like her to start the prescription vitamin D

## 2015-08-25 NOTE — Telephone Encounter (Signed)
Please give patient a call back with lab results

## 2015-08-26 NOTE — Telephone Encounter (Signed)
Yes, Vit D 2000 iu daily. Thx

## 2015-08-26 NOTE — Telephone Encounter (Signed)
Prescription vitamin d is 50000 u weekly. Do you want that sent in or do you want pt to take the daily 2000 u?

## 2015-08-27 NOTE — Telephone Encounter (Signed)
Pt told me that her cancer dr did not want her to take a Vit D Rx: take 2000 iu/d Thx

## 2015-08-31 ENCOUNTER — Ambulatory Visit (HOSPITAL_BASED_OUTPATIENT_CLINIC_OR_DEPARTMENT_OTHER): Payer: BLUE CROSS/BLUE SHIELD | Admitting: Hematology & Oncology

## 2015-08-31 ENCOUNTER — Encounter: Payer: Self-pay | Admitting: Hematology & Oncology

## 2015-08-31 ENCOUNTER — Other Ambulatory Visit (HOSPITAL_BASED_OUTPATIENT_CLINIC_OR_DEPARTMENT_OTHER): Payer: BLUE CROSS/BLUE SHIELD

## 2015-08-31 VITALS — BP 98/80 | HR 72 | Temp 97.6°F | Resp 16 | Ht 63.0 in | Wt 105.0 lb

## 2015-08-31 DIAGNOSIS — C4359 Malignant melanoma of other part of trunk: Secondary | ICD-10-CM

## 2015-08-31 DIAGNOSIS — F329 Major depressive disorder, single episode, unspecified: Secondary | ICD-10-CM

## 2015-08-31 DIAGNOSIS — C7800 Secondary malignant neoplasm of unspecified lung: Secondary | ICD-10-CM

## 2015-08-31 DIAGNOSIS — C73 Malignant neoplasm of thyroid gland: Secondary | ICD-10-CM | POA: Diagnosis not present

## 2015-08-31 DIAGNOSIS — N611 Abscess of the breast and nipple: Secondary | ICD-10-CM

## 2015-08-31 DIAGNOSIS — F32A Depression, unspecified: Secondary | ICD-10-CM

## 2015-08-31 DIAGNOSIS — C439 Malignant melanoma of skin, unspecified: Secondary | ICD-10-CM

## 2015-08-31 DIAGNOSIS — N61 Inflammatory disorders of breast: Secondary | ICD-10-CM | POA: Diagnosis not present

## 2015-08-31 DIAGNOSIS — E559 Vitamin D deficiency, unspecified: Secondary | ICD-10-CM

## 2015-08-31 DIAGNOSIS — E538 Deficiency of other specified B group vitamins: Secondary | ICD-10-CM

## 2015-08-31 LAB — LACTATE DEHYDROGENASE (CC13): LDH: 236 U/L (ref 125–245)

## 2015-08-31 LAB — CBC WITH DIFFERENTIAL (CANCER CENTER ONLY)
BASO#: 0 10*3/uL (ref 0.0–0.2)
BASO%: 0.6 % (ref 0.0–2.0)
EOS ABS: 0.1 10*3/uL (ref 0.0–0.5)
EOS%: 2.4 % (ref 0.0–7.0)
HCT: 40.8 % (ref 34.8–46.6)
HEMOGLOBIN: 13.9 g/dL (ref 11.6–15.9)
LYMPH#: 1.3 10*3/uL (ref 0.9–3.3)
LYMPH%: 23.6 % (ref 14.0–48.0)
MCH: 28 pg (ref 26.0–34.0)
MCHC: 34.1 g/dL (ref 32.0–36.0)
MCV: 82 fL (ref 81–101)
MONO#: 0.6 10*3/uL (ref 0.1–0.9)
MONO%: 11.6 % (ref 0.0–13.0)
NEUT#: 3.3 10*3/uL (ref 1.5–6.5)
NEUT%: 61.8 % (ref 39.6–80.0)
PLATELETS: 257 10*3/uL (ref 145–400)
RBC: 4.96 10*6/uL (ref 3.70–5.32)
RDW: 14.4 % (ref 11.1–15.7)
WBC: 5.3 10*3/uL (ref 3.9–10.0)

## 2015-08-31 LAB — COMPREHENSIVE METABOLIC PANEL (CC13)
ALT: 13 U/L (ref 0–55)
AST: 19 U/L (ref 5–34)
Albumin: 3.6 g/dL (ref 3.5–5.0)
Alkaline Phosphatase: 104 U/L (ref 40–150)
Anion Gap: 9 mEq/L (ref 3–11)
BUN: 9.2 mg/dL (ref 7.0–26.0)
CHLORIDE: 100 meq/L (ref 98–109)
CO2: 22 meq/L (ref 22–29)
CREATININE: 0.9 mg/dL (ref 0.6–1.1)
Calcium: 9 mg/dL (ref 8.4–10.4)
EGFR: 69 mL/min/{1.73_m2} — ABNORMAL LOW (ref >90)
GLUCOSE: 99 mg/dL (ref 70–140)
POTASSIUM: 4.4 meq/L (ref 3.5–5.1)
SODIUM: 131 meq/L — AB (ref 136–145)
Total Bilirubin: 0.4 mg/dL (ref 0.20–1.20)
Total Protein: 6.2 g/dL — ABNORMAL LOW (ref 6.4–8.3)

## 2015-08-31 MED ORDER — AMPHETAMINE-DEXTROAMPHETAMINE 10 MG PO TABS
10.0000 mg | ORAL_TABLET | Freq: Every day | ORAL | Status: DC
Start: 2015-08-31 — End: 2015-11-11

## 2015-08-31 MED ORDER — OXYCODONE-ACETAMINOPHEN 10-325 MG PO TABS: 1.0000 | ORAL_TABLET | ORAL | Status: DC | PRN
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
## 2015-09-01 LAB — VITAMIN D 25 HYDROXY (VIT D DEFICIENCY, FRACTURES): Vit D, 25-Hydroxy: 31 ng/mL (ref 30–100)

## 2015-09-02 ENCOUNTER — Telehealth: Payer: Self-pay | Admitting: *Deleted

## 2015-09-02 NOTE — Telephone Encounter (Addendum)
Left message for patient to call office back  ----- Message from Volanda Napoleon, MD sent at 09/01/2015  6:55 AM EDT ----- Call - vit D level is ok!!  Take 2000units a day of Vit D!!! pete

## 2015-09-06 ENCOUNTER — Telehealth: Payer: Self-pay | Admitting: *Deleted

## 2015-09-06 NOTE — Telephone Encounter (Signed)
-----   Message from Volanda Napoleon, MD sent at 09/01/2015  6:55 AM EDT ----- Call - vit D level is ok!!  Take 2000units a day of Vit D!!! pete

## 2015-09-07 ENCOUNTER — Encounter: Payer: Self-pay | Admitting: *Deleted

## 2015-09-09 ENCOUNTER — Other Ambulatory Visit: Payer: Self-pay | Admitting: Internal Medicine

## 2015-09-09 NOTE — Telephone Encounter (Signed)
Please advise, thanks.

## 2015-09-12 ENCOUNTER — Telehealth: Payer: Self-pay | Admitting: Internal Medicine

## 2015-09-12 NOTE — Telephone Encounter (Signed)
Patient has been out of clonazepam since Friday.  Husband states pharmacy never got phoned in script.  Husband is requesting script to be resent today by 2pm.

## 2015-09-12 NOTE — Telephone Encounter (Signed)
See 09/10/15 refill encounter. I gave verbal to pharmacist at [facility].

## 2015-09-13 MED ORDER — CLONAZEPAM 1 MG PO TABS: ORAL_TABLET | ORAL | Status: DC

## 2015-09-13 NOTE — Addendum Note (Signed)
Addended by: Earnstine Regal on: 09/13/2015 03:35 PM   Modules accepted: Orders

## 2015-09-13 NOTE — Telephone Encounter (Signed)
Printed fax fax to [facility].../lmb

## 2015-09-14 ENCOUNTER — Other Ambulatory Visit: Payer: Self-pay | Admitting: Adult Health

## 2015-09-14 ENCOUNTER — Other Ambulatory Visit: Payer: Self-pay | Admitting: Hematology & Oncology

## 2015-09-27 ENCOUNTER — Other Ambulatory Visit: Payer: Self-pay | Admitting: Hematology & Oncology

## 2015-10-03 ENCOUNTER — Other Ambulatory Visit: Payer: Self-pay | Admitting: Internal Medicine

## 2015-10-13 ENCOUNTER — Other Ambulatory Visit: Payer: Self-pay | Admitting: Family

## 2015-10-18 ENCOUNTER — Other Ambulatory Visit: Payer: Self-pay | Admitting: *Deleted

## 2015-10-18 DIAGNOSIS — C4359 Malignant melanoma of other part of trunk: Secondary | ICD-10-CM

## 2015-10-18 MED ORDER — VEMURAFENIB 240 MG PO TABS: 960.0000 mg | ORAL_TABLET | Freq: Two times a day (BID) | ORAL | Status: DC

## 2015-10-21 ENCOUNTER — Other Ambulatory Visit: Payer: Self-pay | Admitting: Internal Medicine

## 2015-10-21 ENCOUNTER — Other Ambulatory Visit: Payer: Self-pay | Admitting: *Deleted

## 2015-10-21 DIAGNOSIS — C4359 Malignant melanoma of other part of trunk: Secondary | ICD-10-CM

## 2015-10-21 MED ORDER — VEMURAFENIB 240 MG PO TABS: 960.0000 mg | ORAL_TABLET | Freq: Two times a day (BID) | ORAL | Status: DC

## 2015-10-21 NOTE — Telephone Encounter (Signed)
Pt also calling regarding this prescription and was hoping to get a response today

## 2015-11-02 ENCOUNTER — Ambulatory Visit (HOSPITAL_COMMUNITY): Payer: BLUE CROSS/BLUE SHIELD

## 2015-11-10 ENCOUNTER — Encounter (HOSPITAL_COMMUNITY)
Admission: RE | Admit: 2015-11-10 | Discharge: 2015-11-10 | Disposition: A | Discharge status: Home / Self Care | Payer: BLUE CROSS/BLUE SHIELD | Source: Ambulatory Visit | Attending: Hematology & Oncology | Admitting: Hematology & Oncology

## 2015-11-10 DIAGNOSIS — F32A Depression, unspecified: Secondary | ICD-10-CM

## 2015-11-10 DIAGNOSIS — C7800 Secondary malignant neoplasm of unspecified lung: Secondary | ICD-10-CM

## 2015-11-10 DIAGNOSIS — E538 Deficiency of other specified B group vitamins: Secondary | ICD-10-CM | POA: Insufficient documentation

## 2015-11-10 DIAGNOSIS — C4359 Malignant melanoma of other part of trunk: Secondary | ICD-10-CM | POA: Diagnosis not present

## 2015-11-10 DIAGNOSIS — N611 Abscess of the breast and nipple: Secondary | ICD-10-CM | POA: Diagnosis present

## 2015-11-10 DIAGNOSIS — F329 Major depressive disorder, single episode, unspecified: Secondary | ICD-10-CM | POA: Diagnosis present

## 2015-11-10 LAB — GLUCOSE, CAPILLARY: GLUCOSE-CAPILLARY: 110 mg/dL — AB (ref 65–99)

## 2015-11-10 MED ORDER — FLUDEOXYGLUCOSE F - 18 (FDG) INJECTION
5.4200 | Freq: Once | INTRAVENOUS | Status: AC | PRN
Start: 2015-11-10 — End: 2015-11-10
  Administered 2015-11-10: 5.42 via INTRAVENOUS

## 2015-11-11 ENCOUNTER — Other Ambulatory Visit: Payer: Self-pay | Admitting: *Deleted

## 2015-11-11 ENCOUNTER — Telehealth: Payer: Self-pay | Admitting: *Deleted

## 2015-11-11 DIAGNOSIS — C4359 Malignant melanoma of other part of trunk: Secondary | ICD-10-CM

## 2015-11-11 DIAGNOSIS — E538 Deficiency of other specified B group vitamins: Secondary | ICD-10-CM

## 2015-11-11 DIAGNOSIS — N611 Abscess of the breast and nipple: Secondary | ICD-10-CM

## 2015-11-11 DIAGNOSIS — F32A Depression, unspecified: Secondary | ICD-10-CM

## 2015-11-11 DIAGNOSIS — C7800 Secondary malignant neoplasm of unspecified lung: Secondary | ICD-10-CM

## 2015-11-11 DIAGNOSIS — F329 Major depressive disorder, single episode, unspecified: Secondary | ICD-10-CM

## 2015-11-11 MED ORDER — AMPHETAMINE-DEXTROAMPHETAMINE 10 MG PO TABS: 10.0000 mg | ORAL_TABLET | Freq: Every day | ORAL | Status: DC

## 2015-11-11 MED ORDER — OXYCODONE-ACETAMINOPHEN 10-325 MG PO TABS: 1.0000 | ORAL_TABLET | ORAL | Status: DC | PRN

## 2015-11-11 NOTE — Telephone Encounter (Addendum)
Patient aware of results  ----- Message from Volanda Napoleon, MD sent at 11/10/2015  6:15 PM EST ----- Call - Melanoma is in remission- still!!  Laurey Arrow

## 2015-11-14 ENCOUNTER — Other Ambulatory Visit: Payer: Self-pay | Admitting: *Deleted

## 2015-11-14 DIAGNOSIS — D510 Vitamin B12 deficiency anemia due to intrinsic factor deficiency: Secondary | ICD-10-CM

## 2015-11-14 DIAGNOSIS — C4359 Malignant melanoma of other part of trunk: Secondary | ICD-10-CM

## 2015-11-14 DIAGNOSIS — C73 Malignant neoplasm of thyroid gland: Secondary | ICD-10-CM

## 2015-11-14 MED ORDER — CYANOCOBALAMIN 1000 MCG/ML IJ SOLN: 1000.0000 ug | INTRAMUSCULAR | Status: DC

## 2015-11-14 MED ORDER — LEVOTHYROXINE SODIUM 100 MCG PO TABS: 100.0000 ug | ORAL_TABLET | Freq: Every day | ORAL | Status: DC

## 2015-11-22 ENCOUNTER — Ambulatory Visit: Payer: BLUE CROSS/BLUE SHIELD | Admitting: Internal Medicine

## 2015-12-20 ENCOUNTER — Other Ambulatory Visit: Payer: Self-pay | Admitting: *Deleted

## 2015-12-20 DIAGNOSIS — F329 Major depressive disorder, single episode, unspecified: Secondary | ICD-10-CM

## 2015-12-20 DIAGNOSIS — C4359 Malignant melanoma of other part of trunk: Secondary | ICD-10-CM

## 2015-12-20 DIAGNOSIS — C7800 Secondary malignant neoplasm of unspecified lung: Secondary | ICD-10-CM

## 2015-12-20 DIAGNOSIS — N611 Abscess of the breast and nipple: Secondary | ICD-10-CM

## 2015-12-20 DIAGNOSIS — E538 Deficiency of other specified B group vitamins: Secondary | ICD-10-CM

## 2015-12-20 DIAGNOSIS — F32A Depression, unspecified: Secondary | ICD-10-CM

## 2015-12-20 MED ORDER — AMPHETAMINE-DEXTROAMPHETAMINE 10 MG PO TABS: 10.0000 mg | ORAL_TABLET | Freq: Every day | ORAL | Status: DC

## 2015-12-23 ENCOUNTER — Other Ambulatory Visit: Payer: Self-pay | Admitting: *Deleted

## 2015-12-23 NOTE — Telephone Encounter (Signed)
Received call pt states md rx Trintellix for her but she is wanting to come off med. She states it causing real bad stomach pains. How does she need to wean herself off...Susan Mccarthy

## 2015-12-24 NOTE — Telephone Encounter (Signed)
pls go to 5 mg po qd x 1 month then stop if cont to have pain Thx

## 2015-12-26 ENCOUNTER — Other Ambulatory Visit: Payer: BLUE CROSS/BLUE SHIELD

## 2015-12-26 ENCOUNTER — Ambulatory Visit: Payer: BLUE CROSS/BLUE SHIELD | Admitting: Hematology & Oncology

## 2015-12-26 NOTE — Telephone Encounter (Signed)
Called pt again still no answer LMOM RTC.../lmb 

## 2015-12-26 NOTE — Telephone Encounter (Signed)
Called pt no answer LMOM RTC.../lmb 

## 2015-12-27 NOTE — Telephone Encounter (Signed)
Notified pt with md response. She stated that since she never heard back on Friday that she went ahead and stop taking the Trintellix she did not experience any withdrawals symptoms, but did want to let md know that she was very disappointed that she did not get an answer on Friday...Susan Mccarthy

## 2015-12-29 ENCOUNTER — Other Ambulatory Visit (HOSPITAL_BASED_OUTPATIENT_CLINIC_OR_DEPARTMENT_OTHER): Payer: BLUE CROSS/BLUE SHIELD

## 2015-12-29 ENCOUNTER — Ambulatory Visit (HOSPITAL_BASED_OUTPATIENT_CLINIC_OR_DEPARTMENT_OTHER): Payer: BLUE CROSS/BLUE SHIELD | Admitting: Family

## 2015-12-29 ENCOUNTER — Encounter: Payer: Self-pay | Admitting: Family

## 2015-12-29 VITALS — BP 109/72 | HR 78 | Temp 98.5°F | Resp 16 | Ht 63.0 in | Wt 103.0 lb

## 2015-12-29 DIAGNOSIS — C4359 Malignant melanoma of other part of trunk: Secondary | ICD-10-CM

## 2015-12-29 DIAGNOSIS — F32A Depression, unspecified: Secondary | ICD-10-CM

## 2015-12-29 DIAGNOSIS — F329 Major depressive disorder, single episode, unspecified: Secondary | ICD-10-CM

## 2015-12-29 DIAGNOSIS — R197 Diarrhea, unspecified: Secondary | ICD-10-CM | POA: Diagnosis not present

## 2015-12-29 DIAGNOSIS — N611 Abscess of the breast and nipple: Secondary | ICD-10-CM

## 2015-12-29 DIAGNOSIS — C73 Malignant neoplasm of thyroid gland: Secondary | ICD-10-CM | POA: Diagnosis not present

## 2015-12-29 DIAGNOSIS — E538 Deficiency of other specified B group vitamins: Secondary | ICD-10-CM

## 2015-12-29 DIAGNOSIS — C7800 Secondary malignant neoplasm of unspecified lung: Secondary | ICD-10-CM

## 2015-12-29 DIAGNOSIS — E559 Vitamin D deficiency, unspecified: Secondary | ICD-10-CM

## 2015-12-29 LAB — CBC WITH DIFFERENTIAL (CANCER CENTER ONLY)
BASO#: 0 10*3/uL (ref 0.0–0.2)
BASO%: 0.6 % (ref 0.0–2.0)
EOS%: 1.3 % (ref 0.0–7.0)
Eosinophils Absolute: 0.1 10*3/uL (ref 0.0–0.5)
HCT: 39 % (ref 34.8–46.6)
HEMOGLOBIN: 13.3 g/dL (ref 11.6–15.9)
LYMPH#: 1.1 10*3/uL (ref 0.9–3.3)
LYMPH%: 20.5 % (ref 14.0–48.0)
MCH: 28.4 pg (ref 26.0–34.0)
MCHC: 34.1 g/dL (ref 32.0–36.0)
MCV: 83 fL (ref 81–101)
MONO#: 0.6 10*3/uL (ref 0.1–0.9)
MONO%: 11.9 % (ref 0.0–13.0)
NEUT%: 65.7 % (ref 39.6–80.0)
NEUTROS ABS: 3.5 10*3/uL (ref 1.5–6.5)
PLATELETS: 273 10*3/uL (ref 145–400)
RBC: 4.68 10*6/uL (ref 3.70–5.32)
RDW: 13.6 % (ref 11.1–15.7)
WBC: 5.3 10*3/uL (ref 3.9–10.0)

## 2015-12-29 LAB — COMPREHENSIVE METABOLIC PANEL
ALT: 23 U/L (ref 0–55)
ANION GAP: 6 meq/L (ref 3–11)
AST: 27 U/L (ref 5–34)
Albumin: 3.7 g/dL (ref 3.5–5.0)
Alkaline Phosphatase: 136 U/L (ref 40–150)
BUN: 14 mg/dL (ref 7.0–26.0)
CHLORIDE: 99 meq/L (ref 98–109)
CO2: 26 meq/L (ref 22–29)
Calcium: 9.1 mg/dL (ref 8.4–10.4)
Creatinine: 0.8 mg/dL (ref 0.6–1.1)
EGFR: 77 mL/min/{1.73_m2} — AB (ref >90)
GLUCOSE: 98 mg/dL (ref 70–140)
POTASSIUM: 5.3 meq/L — AB (ref 3.5–5.1)
Sodium: 131 mEq/L — ABNORMAL LOW (ref 136–145)
Total Bilirubin: 0.45 mg/dL (ref 0.20–1.20)
Total Protein: 6.6 g/dL (ref 6.4–8.3)

## 2015-12-29 LAB — LACTATE DEHYDROGENASE: LDH: 261 U/L — AB (ref 125–245)

## 2015-12-30 LAB — VITAMIN D 25 HYDROXY (VIT D DEFICIENCY, FRACTURES): Vitamin D, 25-Hydroxy: 35 ng/mL (ref 30.0–100.0)

## 2015-12-30 NOTE — Progress Notes (Signed)
Hematology and Oncology Follow Up Visit  JAYSON MCNEASE [id number] 23-Apr-1951 65 y.o. 12/30/2015   Principle Diagnosis:  Metastatic melanoma Follicular carcinoma the thyroid  Current Therapy:   Zelboraf 960 mg by mouth twice a day    Interim History:  Ms. Casassa is is here today for a follow-up. She is doing remarkably well. She states that she is still having a little diarrhea with the Zelboraf but describes this as tolerable. She also has the occasional lesion appear from her medication. She currently has 2 lesions at the 11 o'clock position of the left breast and one on her right forearm. She continues to see Dr. Ubaldo Glassing with Dermatology on a regular basis. She has had no fever, chills, n/v, cough, rash, dizziness, SOB, chest pain, palpitations, abdominal pain or changes in bowel or bladder habits.  She is still having arthritic aches and pains. She states that this is unchanged. She has been doing exercised to try and improve her discomfort.  She has a good appetite and is staying hydrated. Her weight is down 2 lbs since her last visit.    Medications:    Medication List       This list is accurate as of: 12/29/15 11:59 PM.  Always use your most recent med list.               amphetamine-dextroamphetamine 10 MG tablet  Commonly known as:  ADDERALL  Take 1 tablet (10 mg total) by mouth daily.     celecoxib 100 MG capsule  Commonly known as:  CELEBREX  TAKE 1 CAPSULE (100 MG TOTAL) BY MOUTH 2 (TWO) TIMES DAILY. TAKE WITH FOOD.     clonazePAM 1 MG tablet  Commonly known as:  KLONOPIN  TAKE 1 TABLET BY MOUTH 3 TIMES A DAY AS NEEDED FOR ANXIETY     cyanocobalamin 1000 MCG/ML injection  Commonly known as:  (VITAMIN B-12)  Inject 1 mL (1,000 mcg total) into the muscle every 30 (thirty) days. On the 17th. 90 day supply     diclofenac sodium 1 % Gel  Commonly known as:  VOLTAREN  APPLY TO AFFECTED AREA 4 TIMES DAILY     diphenoxylate-atropine 2.5-0.025 MG tablet  Commonly  known as:  LOMOTIL  Take 1-2 tablets by mouth 4 (four) times daily as needed for diarrhea or loose stools.     levothyroxine 100 MCG tablet  Commonly known as:  SYNTHROID, LEVOTHROID  Take 1 tablet (100 mcg total) by mouth daily. 90 day supply.     lipase/protease/amylase 12000 units Cpep capsule  Commonly known as:  CREON  Take 1 capsule (12,000 Units total) by mouth 3 (three) times daily with meals.     neomycin-polymyxin b-dexamethasone 3.5-10000-0.1 Susp  Commonly known as:  MAXITROL  Place 1 drop into both eyes 3 (three) times daily.     oxyCODONE-acetaminophen 10-325 MG tablet  Commonly known as:  PERCOCET  Take 1 tablet by mouth every 4 (four) hours as needed for pain. 1 by mouth every 4 hours as needed, DO NOT EXCEED 3000 mg OF APAP FROM ALL SOURCES/24HOURS     promethazine 12.5 MG tablet  Commonly known as:  PHENERGAN  Take 1 tablet (12.5 mg total) by mouth every 6 (six) hours as needed. nausea     promethazine 12.5 MG tablet  Commonly known as:  PHENERGAN  TAKE 1 TABLET BY MOUTH EVERY 6 HOURS AS NEEDED FOR NAUSEA     ranitidine 300 MG tablet  Commonly known as:  ZANTAC  Take 1 tablet (300 mg total) by mouth at bedtime.     traZODone 50 MG tablet  Commonly known as:  DESYREL  TAKE 1 TO 2 TABLETS AT BEDTIME     vemurafenib 240 MG tablet  Commonly known as:  ZELBORAF  Take 4 tablets (960 mg total) by mouth every 12 (twelve) hours. Take with water.     Vitamin D3 2000 units capsule  Take 1 capsule (2,000 Units total) by mouth daily.        Allergies: No Known Allergies  Past Medical History, Surgical history, Social history, and Family History were reviewed and updated.  Review of Systems: All other 10 point review of systems is negative.   Physical Exam:  height is 5\' 3"  (1.6 m) and weight is 103 lb (46.72 kg). Her oral temperature is 98.5 F (36.9 C). Her blood pressure is 109/72 and her pulse is 78. Her respiration is 16.   Wt Readings from Last 3  Encounters:  12/29/15 103 lb (46.72 kg)  08/31/15 105 lb (47.628 kg)  08/22/15 104 lb (47.174 kg)    Ocular: Sclerae unicteric, pupils equal, round and reactive to light Ear-nose-throat: Oropharynx clear, dentition fair Lymphatic: No cervical supraclavicular or axillary adenopathy Lungs no rales or rhonchi, good excursion bilaterally Heart regular rate and rhythm, no murmur appreciated Abd soft, nontender, positive bowel sounds, no liver or spleen tip palpated on exam MSK no focal spinal tenderness, no joint edema Neuro: non-focal, well-oriented, appropriate affect Breasts: She has two skin lesions at the 11 o'clock position of the left breast from the Zelboraf. No discharge, odor or pain with these.   Lab Results  Component Value Date   WBC 5.3 12/29/2015   HGB 13.3 12/29/2015   HCT 39.0 12/29/2015   MCV 83 12/29/2015   PLT 273 12/29/2015   No results found for: FERRITIN, IRON, TIBC, UIBC, IRONPCTSAT Lab Results  Component Value Date   RBC 4.68 12/29/2015   No results found for: KPAFRELGTCHN, LAMBDASER, KAPLAMBRATIO No results found for: IGGSERUM, IGA, IGMSERUM No results found for: Odetta Pink, SPEI   Chemistry      Component Value Date/Time   NA 131* 12/29/2015 1108   NA 130* 08/22/2015 1654   NA 135 04/11/2015 1437   K 5.3* 12/29/2015 1108   K 4.3 08/22/2015 1654   K 3.9 04/11/2015 1437   CL 96 08/22/2015 1654   CL 96* 04/11/2015 1437   CO2 26 12/29/2015 1108   CO2 28 08/22/2015 1654   CO2 28 04/11/2015 1437   BUN 14.0 12/29/2015 1108   BUN 9 08/22/2015 1654   BUN 13 04/11/2015 1437   CREATININE 0.8 12/29/2015 1108   CREATININE 0.73 08/22/2015 1654   CREATININE 1.3* 04/11/2015 1437      Component Value Date/Time   CALCIUM 9.1 12/29/2015 1108   CALCIUM 9.1 08/22/2015 1654   CALCIUM 9.5 04/11/2015 1437   ALKPHOS 136 12/29/2015 1108   ALKPHOS 91 08/22/2015 1654   ALKPHOS 71 04/11/2015 1437   AST 27  12/29/2015 1108   AST 15 08/22/2015 1654   AST 24 04/11/2015 1437   ALT 23 12/29/2015 1108   ALT 10 08/22/2015 1654   ALT 19 04/11/2015 1437   BILITOT 0.45 12/29/2015 1108   BILITOT 0.5 08/22/2015 1654   BILITOT 0.60 04/11/2015 1437     Impression and Plan: Ms. Nagasawa is 65 yo white female with metastatic melanoma. She has done well on  Zelboraf for the last 4 years. Her PET scan in December showed no evidence of recurrent or metastatic disease.  She does have 2 lesions on her left breast from the medication. She continues to be followed by Dr. Kimberlee Nearing with Dermatology on a regular basis to have these checked.  She will continue on Zelboraf. Her diarrhea has improved and she describes it as tolerable.  We will plan to see her back in 4 months for labs and follow-up.   Eliezer Bottom, NP 1/27/20178:41 AM

## 2016-01-25 ENCOUNTER — Other Ambulatory Visit: Payer: Self-pay | Admitting: *Deleted

## 2016-01-25 DIAGNOSIS — F329 Major depressive disorder, single episode, unspecified: Secondary | ICD-10-CM

## 2016-01-25 DIAGNOSIS — E538 Deficiency of other specified B group vitamins: Secondary | ICD-10-CM

## 2016-01-25 DIAGNOSIS — N611 Abscess of the breast and nipple: Secondary | ICD-10-CM

## 2016-01-25 DIAGNOSIS — F32A Depression, unspecified: Secondary | ICD-10-CM

## 2016-01-25 DIAGNOSIS — C7800 Secondary malignant neoplasm of unspecified lung: Secondary | ICD-10-CM

## 2016-01-25 DIAGNOSIS — C4359 Malignant melanoma of other part of trunk: Secondary | ICD-10-CM

## 2016-01-25 MED ORDER — AMPHETAMINE-DEXTROAMPHETAMINE 10 MG PO TABS: 10.0000 mg | ORAL_TABLET | Freq: Every day | ORAL | Status: DC

## 2016-01-25 MED ORDER — OXYCODONE-ACETAMINOPHEN 10-325 MG PO TABS: 1.0000 | ORAL_TABLET | ORAL | Status: DC | PRN

## 2016-01-26 MED FILL — AMPHETAMINE SALTS 10 MG TAB: 10 | 30 days supply | Qty: 30 | Fill #0

## 2016-01-26 MED FILL — OXYCODONE-APAP 10-325 TAB: 10-325 | 30 days supply | Qty: 180 | Fill #0

## 2016-01-29 ENCOUNTER — Other Ambulatory Visit: Payer: Self-pay | Admitting: Internal Medicine

## 2016-03-06 ENCOUNTER — Emergency Department (HOSPITAL_COMMUNITY)
Admission: EM | Admit: 2016-03-06 | Discharge: 2016-03-07 | Disposition: A | Discharge status: Home / Self Care | Payer: BLUE CROSS/BLUE SHIELD | Attending: Emergency Medicine | Admitting: Emergency Medicine

## 2016-03-06 ENCOUNTER — Encounter (HOSPITAL_COMMUNITY): Payer: Self-pay

## 2016-03-06 DIAGNOSIS — Z8669 Personal history of other diseases of the nervous system and sense organs: Secondary | ICD-10-CM | POA: Insufficient documentation

## 2016-03-06 DIAGNOSIS — F329 Major depressive disorder, single episode, unspecified: Secondary | ICD-10-CM | POA: Insufficient documentation

## 2016-03-06 DIAGNOSIS — F419 Anxiety disorder, unspecified: Secondary | ICD-10-CM | POA: Diagnosis not present

## 2016-03-06 DIAGNOSIS — Z9221 Personal history of antineoplastic chemotherapy: Secondary | ICD-10-CM | POA: Insufficient documentation

## 2016-03-06 DIAGNOSIS — Z85850 Personal history of malignant neoplasm of thyroid: Secondary | ICD-10-CM | POA: Insufficient documentation

## 2016-03-06 DIAGNOSIS — Z85820 Personal history of malignant melanoma of skin: Secondary | ICD-10-CM | POA: Insufficient documentation

## 2016-03-06 DIAGNOSIS — Z87891 Personal history of nicotine dependence: Secondary | ICD-10-CM | POA: Diagnosis not present

## 2016-03-06 DIAGNOSIS — E079 Disorder of thyroid, unspecified: Secondary | ICD-10-CM | POA: Insufficient documentation

## 2016-03-06 DIAGNOSIS — Z79899 Other long term (current) drug therapy: Secondary | ICD-10-CM | POA: Insufficient documentation

## 2016-03-06 DIAGNOSIS — M1990 Unspecified osteoarthritis, unspecified site: Secondary | ICD-10-CM | POA: Diagnosis not present

## 2016-03-06 DIAGNOSIS — M2550 Pain in unspecified joint: Secondary | ICD-10-CM | POA: Insufficient documentation

## 2016-03-06 DIAGNOSIS — Z008 Encounter for other general examination: Secondary | ICD-10-CM | POA: Diagnosis present

## 2016-03-06 LAB — RAPID URINE DRUG SCREEN, HOSP PERFORMED
Amphetamines: NOT DETECTED
Barbiturates: NOT DETECTED
Benzodiazepines: NOT DETECTED
Cocaine: NOT DETECTED
Opiates: NOT DETECTED
Tetrahydrocannabinol: NOT DETECTED

## 2016-03-06 LAB — URINALYSIS, ROUTINE W REFLEX MICROSCOPIC
Bilirubin Urine: NEGATIVE
Glucose, UA: NEGATIVE mg/dL
HGB URINE DIPSTICK: NEGATIVE
Ketones, ur: NEGATIVE mg/dL
LEUKOCYTES UA: NEGATIVE
NITRITE: NEGATIVE
PROTEIN: NEGATIVE mg/dL
Specific Gravity, Urine: 1.008 (ref 1.005–1.030)
pH: 7 (ref 5.0–8.0)

## 2016-03-06 MED ORDER — OXYCODONE-ACETAMINOPHEN 10-325 MG PO TABS
1.0000 | ORAL_TABLET | ORAL | Status: DC | PRN
Start: 2016-03-06 — End: 2016-03-06

## 2016-03-06 MED ORDER — VEMURAFENIB 240 MG PO TABS: 960.0000 mg | ORAL_TABLET | Freq: Two times a day (BID) | ORAL | Status: DC

## 2016-03-06 MED ORDER — CLONAZEPAM 0.5 MG PO TABS
1.0000 mg | ORAL_TABLET | Freq: Three times a day (TID) | ORAL | Status: DC | PRN
  Administered 2016-03-07: 1 mg via ORAL
  Filled 2016-03-06 (×2): qty 2

## 2016-03-06 MED ORDER — OXYCODONE HCL 5 MG PO TABS: 5.0000 mg | ORAL_TABLET | ORAL | Status: DC | PRN

## 2016-03-06 MED ORDER — LEVOTHYROXINE SODIUM 100 MCG PO TABS
100.0000 ug | ORAL_TABLET | Freq: Every day | ORAL | Status: DC
  Administered 2016-03-07 (×2): 100 ug via ORAL
  Filled 2016-03-06 (×3): qty 1

## 2016-03-06 MED ORDER — OXYCODONE-ACETAMINOPHEN 5-325 MG PO TABS
1.0000 | ORAL_TABLET | ORAL | Status: DC | PRN
  Administered 2016-03-07: 1 via ORAL
  Filled 2016-03-06 (×2): qty 1

## 2016-03-06 MED ORDER — LORAZEPAM 1 MG PO TABS: 0.0000 mg | ORAL_TABLET | Freq: Two times a day (BID) | ORAL | Status: DC

## 2016-03-06 MED ORDER — PANCRELIPASE (LIP-PROT-AMYL) 12000-38000 UNITS PO CPEP
12000.0000 [IU] | ORAL_CAPSULE | Freq: Three times a day (TID) | ORAL | Status: DC
  Administered 2016-03-07: 12000 [IU] via ORAL
  Filled 2016-03-06 (×7): qty 1

## 2016-03-06 MED ORDER — LORAZEPAM 1 MG PO TABS: 0.0000 mg | ORAL_TABLET | Freq: Four times a day (QID) | ORAL | Status: DC

## 2016-03-06 MED ORDER — TRAZODONE HCL 50 MG PO TABS
50.0000 mg | ORAL_TABLET | Freq: Every day | ORAL | Status: DC
  Filled 2016-03-06: qty 1

## 2016-03-06 MED ORDER — LORAZEPAM 1 MG PO TABS
2.0000 mg | ORAL_TABLET | Freq: Once | ORAL | Status: AC
  Administered 2016-03-06: 2 mg via ORAL
  Filled 2016-03-06: qty 2

## 2016-03-06 NOTE — ED Notes (Signed)
Pt had last drink about 3pm, wine drink, and hasn't eaten
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
## 2016-03-06 NOTE — ED Notes (Signed)
Pt here for medical clearance with extreme anxiety, and wants to be in recovery for ETOH and drugs, pt needs inpatient treatement

## 2016-03-06 NOTE — BH Assessment (Signed)
Assessment started and patient states that she drank earlier today. Patients judgement appears to be somewhat impaired. Requested TTS consult be removed until patients BAL has resulted. UA requested.   Rosalin Hawking, LCSW Therapeutic Triage Specialist [place] 03/06/2016 11:50 PM

## 2016-03-06 NOTE — ED Notes (Signed)
Delay in labs, pt with TTS

## 2016-03-07 ENCOUNTER — Other Ambulatory Visit: Payer: Self-pay | Admitting: *Deleted

## 2016-03-07 DIAGNOSIS — C4359 Malignant melanoma of other part of trunk: Secondary | ICD-10-CM

## 2016-03-07 LAB — COMPREHENSIVE METABOLIC PANEL
ALK PHOS: 89 U/L (ref 38–126)
ALT: 30 U/L (ref 14–54)
AST: 47 U/L — AB (ref 15–41)
Albumin: 4 g/dL (ref 3.5–5.0)
Anion gap: 11 (ref 5–15)
BILIRUBIN TOTAL: 0.8 mg/dL (ref 0.3–1.2)
BUN: 10 mg/dL (ref 6–20)
CALCIUM: 8.7 mg/dL — AB (ref 8.9–10.3)
CHLORIDE: 90 mmol/L — AB (ref 101–111)
CO2: 22 mmol/L (ref 22–32)
CREATININE: 0.44 mg/dL (ref 0.44–1.00)
Glucose, Bld: 118 mg/dL — ABNORMAL HIGH (ref 65–99)
Potassium: 3.7 mmol/L (ref 3.5–5.1)
Sodium: 123 mmol/L — ABNORMAL LOW (ref 135–145)
Total Protein: 6.5 g/dL (ref 6.5–8.1)

## 2016-03-07 LAB — CBC
HEMATOCRIT: 36 % (ref 36.0–46.0)
Hemoglobin: 12.6 g/dL (ref 12.0–15.0)
MCH: 28.9 pg (ref 26.0–34.0)
MCHC: 35 g/dL (ref 30.0–36.0)
MCV: 82.6 fL (ref 78.0–100.0)
PLATELETS: 293 10*3/uL (ref 150–400)
RBC: 4.36 MIL/uL (ref 3.87–5.11)
RDW: 14.2 % (ref 11.5–15.5)
WBC: 10 10*3/uL (ref 4.0–10.5)

## 2016-03-07 LAB — ETHANOL

## 2016-03-07 MED ORDER — VEMURAFENIB 240 MG PO TABS: 960.0000 mg | ORAL_TABLET | Freq: Two times a day (BID) | ORAL | Status: DC
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
## 2016-03-07 NOTE — ED Notes (Signed)
Pt is waiting on evaluation by the physician this am, also Dr Marin Olp needs to be contacted to see if she still needs to be taking her chemo medication, last note in January said that she needs to continue it.

## 2016-03-07 NOTE — ED Notes (Signed)
Pt coming up to desk repeatedly.  Pt very anxious.  Meds given

## 2016-03-07 NOTE — Discharge Instructions (Signed)
For your ongoing behavioral health needs, you are advised to follow up with the Mental Health Intensive Outpatient Program (MH-IOP) at the [facility] at [place].  You are scheduled to start the program on Tuesday, 03/13/2016 at 8:30 am.  Contact Dellia Nims, MEd at the phone number indicated below if you have any questions:       [facility] at [place], [place] [address]      Contact person: Dellia Nims, [place]      [phone]
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
## 2016-03-07 NOTE — ED Provider Notes (Signed)
  Face-to-face evaluation   History: Patient presents for evaluation of nervousness and feeling like she needs "stabilization." She is having trouble with her "emotions". She is currently living in a hotel. She states she left her husband about a month ago. She is not currently taking antidepressants or seeing a therapist.  Physical exam: Alert, elderly female. She is mildly dysarthric. She is cooperative and interactive. No respiratory distress. Moves all extremities equally.  Medical screening examination/treatment/procedure(s) were conducted as a shared visit with non-physician practitioner(s) and myself.  I personally evaluated the patient during the encounter  Daleen Bo, MD 03/10/16 [phone]
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
## 2016-03-12 ENCOUNTER — Other Ambulatory Visit: Payer: Self-pay | Admitting: Nurse Practitioner

## 2016-03-12 DIAGNOSIS — E538 Deficiency of other specified B group vitamins: Secondary | ICD-10-CM

## 2016-03-12 DIAGNOSIS — C4359 Malignant melanoma of other part of trunk: Secondary | ICD-10-CM

## 2016-03-12 DIAGNOSIS — N611 Abscess of the breast and nipple: Secondary | ICD-10-CM

## 2016-03-12 DIAGNOSIS — C7800 Secondary malignant neoplasm of unspecified lung: Secondary | ICD-10-CM

## 2016-03-12 DIAGNOSIS — F32A Depression, unspecified: Secondary | ICD-10-CM

## 2016-03-12 DIAGNOSIS — F329 Major depressive disorder, single episode, unspecified: Secondary | ICD-10-CM

## 2016-03-12 MED ORDER — OXYCODONE-ACETAMINOPHEN 10-325 MG PO TABS: 1.0000 | ORAL_TABLET | ORAL | Status: DC | PRN

## 2016-04-06 ENCOUNTER — Other Ambulatory Visit: Payer: Self-pay | Admitting: Internal Medicine

## 2016-04-06 NOTE — Telephone Encounter (Signed)
Pt husband called in and said that pt is completely out of these 2 meds.  And she needs them asap.  She is hurting and can not make it though the weekend without them.

## 2016-04-09 ENCOUNTER — Other Ambulatory Visit: Payer: Self-pay | Admitting: Internal Medicine

## 2016-04-09 NOTE — Telephone Encounter (Signed)
Spouse is requesting script to be sent today because patient is out of this medication.  Spouse states he is picking up an additions script at 3pm.  States pharmacy is 65 miles from him.  He is requesting script to be at pharmacy before 3pm.

## 2016-04-09 NOTE — Telephone Encounter (Signed)
Sch OV pls

## 2016-04-10 NOTE — Telephone Encounter (Signed)
sch OV pls 

## 2016-04-10 NOTE — Telephone Encounter (Signed)
Signed rx faxed to pharmacy

## 2016-04-11 ENCOUNTER — Telehealth: Payer: Self-pay | Admitting: *Deleted

## 2016-04-11 NOTE — Telephone Encounter (Signed)
Patient and [facility] called on 3 way conversation allowing [name] to speak to [place].  Patient getting ready to enter inpatient rehab center and Josephina Gip wanted to know if it was ok for patient to wait to have her 6 month PET scan until mid July.  [place] with Dr. Marin Olp

## 2016-04-27 ENCOUNTER — Ambulatory Visit: Payer: BLUE CROSS/BLUE SHIELD | Admitting: Family

## 2016-04-27 ENCOUNTER — Other Ambulatory Visit: Payer: BLUE CROSS/BLUE SHIELD

## 2016-05-10 ENCOUNTER — Other Ambulatory Visit: Payer: Self-pay | Admitting: Internal Medicine

## 2016-05-11 NOTE — Telephone Encounter (Signed)
Marchia Bond called to advise that the patient is totally out of this med. Advised that it is currently pending with dr Alain Marion

## 2016-05-12 ENCOUNTER — Other Ambulatory Visit: Payer: Self-pay | Admitting: Internal Medicine

## 2016-05-14 NOTE — Telephone Encounter (Signed)
Called refill into [facility] had to leave on pharmacy vm.../lmb 

## 2016-05-16 ENCOUNTER — Ambulatory Visit: Payer: BLUE CROSS/BLUE SHIELD | Admitting: Internal Medicine

## 2016-05-18 ENCOUNTER — Ambulatory Visit: Payer: BLUE CROSS/BLUE SHIELD | Admitting: Internal Medicine

## 2016-05-26 ENCOUNTER — Encounter (HOSPITAL_COMMUNITY): Payer: Self-pay | Admitting: Emergency Medicine

## 2016-05-26 ENCOUNTER — Emergency Department (HOSPITAL_COMMUNITY)
Admission: EM | Admit: 2016-05-26 | Discharge: 2016-05-26 | Disposition: A | Discharge status: Home / Self Care | Payer: BLUE CROSS/BLUE SHIELD | Attending: Emergency Medicine | Admitting: Emergency Medicine

## 2016-05-26 DIAGNOSIS — Z79899 Other long term (current) drug therapy: Secondary | ICD-10-CM | POA: Insufficient documentation

## 2016-05-26 DIAGNOSIS — F329 Major depressive disorder, single episode, unspecified: Secondary | ICD-10-CM | POA: Insufficient documentation

## 2016-05-26 DIAGNOSIS — Z85820 Personal history of malignant melanoma of skin: Secondary | ICD-10-CM | POA: Insufficient documentation

## 2016-05-26 DIAGNOSIS — Z791 Long term (current) use of non-steroidal anti-inflammatories (NSAID): Secondary | ICD-10-CM | POA: Insufficient documentation

## 2016-05-26 DIAGNOSIS — Z85850 Personal history of malignant neoplasm of thyroid: Secondary | ICD-10-CM | POA: Diagnosis not present

## 2016-05-26 DIAGNOSIS — M1990 Unspecified osteoarthritis, unspecified site: Secondary | ICD-10-CM | POA: Insufficient documentation

## 2016-05-26 DIAGNOSIS — F419 Anxiety disorder, unspecified: Secondary | ICD-10-CM

## 2016-05-26 DIAGNOSIS — Z87891 Personal history of nicotine dependence: Secondary | ICD-10-CM | POA: Insufficient documentation

## 2016-05-26 MED ORDER — HYDROXYZINE HCL 10 MG PO TABS: 10.0000 mg | ORAL_TABLET | Freq: Four times a day (QID) | ORAL | Status: DC | PRN

## 2016-05-26 MED ORDER — HYDROXYZINE HCL 25 MG PO TABS
25.0000 mg | ORAL_TABLET | Freq: Once | ORAL | Status: AC
  Administered 2016-05-26: 25 mg via ORAL
  Filled 2016-05-26: qty 1

## 2016-05-26 NOTE — ED Notes (Signed)
PT DISCHARGED. INSTRUCTIONS AND PRESCRIPTION GIVEN. AAOX4. PT IN NO APPARENT DISTRESS OR PAIN. THE OPPORTUNITY TO ASK QUESTIONS WAS PROVIDED. 
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
## 2016-05-26 NOTE — ED Notes (Signed)
Bed: [facility] Expected date:  Expected time:  Means of arrival:  Comments: 

## 2016-05-26 NOTE — Discharge Instructions (Signed)
Generalized Anxiety Disorder Generalized anxiety disorder (GAD) is a mental disorder. It interferes with life functions, including relationships, work, and school. GAD is different from normal anxiety, which everyone experiences at some point in their lives in response to specific life events and activities. Normal anxiety actually helps [place] prepare for and get through these life events and activities. Normal anxiety goes away after the event or activity is over.  GAD causes anxiety that is not necessarily related to specific events or activities. It also causes excess anxiety in proportion to specific events or activities. The anxiety associated with GAD is also difficult to control. GAD can vary from mild to severe. People with severe GAD can have intense waves of anxiety with physical symptoms (panic attacks).  SYMPTOMS The anxiety and worry associated with GAD are difficult to control. This anxiety and worry are related to many life events and activities and also occur more days than not for 6 months or longer. People with GAD also have three or more of the following symptoms (one or more in children):  Restlessness.   Fatigue.  Difficulty concentrating.   Irritability.  Muscle tension.  Difficulty sleeping or unsatisfying sleep. DIAGNOSIS GAD is diagnosed through an assessment by your health care provider. Your health care provider will ask you questions aboutyour mood,physical symptoms, and events in your life. Your health care provider may ask you about your medical history and use of alcohol or drugs, including prescription medicines. Your health care provider may also do a physical exam and blood tests. Certain medical conditions and the use of certain substances can cause symptoms similar to those associated with GAD. Your health care provider may refer you to a mental health specialist for further evaluation. TREATMENT The following therapies are usually used to treat GAD:    Medication. Antidepressant medication usually is prescribed for long-term daily control. Antianxiety medicines may be added in severe cases, especially when panic attacks occur.   Talk therapy (psychotherapy). Certain types of talk therapy can be helpful in treating GAD by providing support, education, and guidance. A form of talk therapy called cognitive behavioral therapy can teach you healthy ways to think about and react to daily life events and activities.  Stress managementtechniques. These include yoga, meditation, and exercise and can be very helpful when they are practiced regularly. A mental health specialist can help determine which treatment is best for you. Some people see improvement with one therapy. However, other people require a combination of therapies.   This information is not intended to replace advice given to you by your health care provider. Make sure you discuss any questions you have with your health care provider.   Document Released: 03/16/2013 Document Revised: 12/10/2014 Document Reviewed: 03/16/2013 Elsevier Interactive Patient Education 2016 [place] heal resources:  Substance Abuse Treatment Programs  Intensive Outpatient Programs [facility]     [address], [place]       [facility] [place] [address] [place], [place] Outpatient     (Inpatient and outpatient)     [address]           [place] [phone] (Suboxone and Methadone)  [place], [place] [address]  [place] [phone] [place], [place]  [facility] [place] [facility] (Outpatient/Inpatient, Chemical)    (insurance only) [phone]             [facility] ([place]) [place], [place]     [facility]     [address]     [place], [place]       [facility] (for caregivers and family) [phone] [address] [place]een Mans. [place], [place]      Residential Treatment Programs [facility]      [address] [place], [place] [address]  [phone]       [facility] [address], [place], [place] [address] [phone]  [facility] [place]        [phone]       [facility] [place] [facility] [phone]  [facility] ([place]             [place], [place] or [place] of [place], [address] [phone]  [facility]    [address]      [place], [place]       [facility] [address] [place], [place]  [place]   [address] [place], [place] [address]     [phone]      Admissions: 8am-3pm M-F  [facility] (RTS) [address] [place], [place]  [facility]ATS Program: Residential Program [phone] Days)   [place], [place] or [phone]     ADATC: [facility] [place], [place] (Walk in Hours over the weekend or by referral)  [facility] [place], [place], [place] [address] [phone]  Crisis Mobile: [facility]:  [phone] (for crisis response 24 hours a day) [facility] Hotline:      [phone] Outpatient Psychiatry and Counseling  [facility]: Mobile Crisis Management 24 hours:  [phone]  [facility] of the [employer] sliding scale fee and walk in schedule: M-F 8am-12pm/1pm-3pm [place], [place] [address] [place], [place]  [address] [phone]  [facility] (Formerly known as [facility] [employer])- new patient walk-in appointments available Monday - Friday 8am -3pm.          [address] [place], [place] [address] [phone] or crisis line- [place] [facility]/ Intensive Outpatient Therapy Program [place], [place] [address] [place]  [id number]      [address], [place] [address]                 [place]   [facility] [phone]. [place], [place] [address]   [employer] of Care          [address] Johnette Abraham  [place], [place] [address]       [phone]  [facility] [address], [place], [place] [address] [phone]  [facility]    [address] [place], [place] [address]     [place], [place] Joycelyn Man     [place] [address]     [phone]       [facility] [place] [address]  [facility]     [address], [place], [place], [place] [address] [place]     [address]     [place], [place] [address]     [phone]       [facility] [address] [place], [place] [address] [phone] Resources for Temporary Residential Assistance/Crisis [place] [facility]) M-F 8am-3pm   [address], [place] [address]   [phone] Services include: laundry, barbering, support groups, case management, phone  & computer access, showers, AA/NA mtgs, mental health/substance abuse nurse, job skills class,  disability information, VA assistance, spiritual classes, etc.   [facility]   [address] [place]     [place]              [employer] (women and children)       [place]. [place], [place] [address] [phone] [email or web address] for application and process Application Required  [facility]   [address]    [place] [address]     [phone]                    [place], [place] [address] [id number] application appt.) Application Required  [facility] (women only)    [address]     [place], [place] [address]     [phone]      Intake starts 6pm daily Need valid ID, SSC, & Police report [employer] [address] [place], [place] [id number] Application Required  [employer] (men only)     [place].      [place], [place]       [place] (Pregnant women only) [address] [place], [place]  [facility]      [place] Dani [address].      [place], [place] [address]     [phone]             [facility] [address] [place], [place] 90 day commitment/SA/Application process  [facility](men only)     [address]     [place], [place]       Check-in at [facility] of [facility] [address] [place], [place] [address] [phone] Men/Women/Women and Children must be there by 7 pm  [place], [place]
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
## 2016-05-26 NOTE — ED Provider Notes (Signed)
CSN: [id number]     Arrival date & time 05/26/16  1651 History   First MD Initiated Contact with Patient 05/26/16 1730     Chief Complaint  Patient presents with  . Anxiety    Susan Mccarthy is a 65 y.o. female who presents to the ED complaining of worsening anxiety over the past several weeks. The patient tells me she went to a rehab facility to get off of her narcotic pain medicines and also gave up her Klonopin. She reports she should not have done this and she has experienced worsening anxiety since. She has a primary care provider in town but has not followed up with him. She reports increasing anxiety related to caring for her husband and taking care of her house. She denies SI or HI. She denies VH or AH. She is not followed by a therapist or a psychiatrist. She denies physical complaints. She denies fevers, chest pain, SOB, abdominal pain, nausea, vomiting or diarrhea.  Patient is a 65 y.o. female presenting with anxiety. The history is provided by the patient. No language interpreter was used.  Anxiety Pertinent negatives include no abdominal pain, chest pain, coughing, fever, headaches, nausea, neck pain, rash, sore throat or vomiting.    Past Medical History  Diagnosis Date  . History of chemotherapy     INTERFERON  . Anxiety   . Depression   . Thyroid disease     85 % BENIGN  . Insomnia   . Arthritis   . Melanoma ([place]) 1980    RIGHT BACK  . 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 Excision of melanoma  1980  . Axillary node dissection  08/02/11    right axillary,  . Rash      ON FACE AND HANDS S/P CHEMOTHERAPY  . Thyroidectomy  1/14    cancer  . Sacro-iliac pinning Bilateral 08/27/2013    Procedure: SACRO-ILIAC PINNING;  Surgeon: Rozanna Box, MD;  Location: [place];  Service: Orthopedics;  Laterality: Bilateral;  Handy Bed, OIC Cannulated Screws  . Percutaneous pinning Left 08/27/2013    Procedure: PERCUTANEOUS PINNING EXTREMITY;   Surgeon: Rozanna Box, MD;  Location: [place];  Service: Orthopedics;  Laterality: Left;   Family History  Problem Relation Age of Onset  . ALS Father    Social History  Substance Use Topics  . Smoking status: Former Smoker -- 0.25 packs/day for 5 years    Types: Cigarettes    Start date: 01/11/1974    Quit date: 06/07/1979  . Smokeless tobacco: Never Used     Comment: quit 35 years  . Alcohol Use: 0.0 oz/week    0 Standard drinks or equivalent per week     Comment: RARE OCCASIONAL    OB History    Gravida Para Term Preterm AB TAB SAB Ectopic Multiple Living   0               Obstetric Comments   MENARCHE TENNAGER 10, NO HRT, BIRTH CONTROL PILLS 2 YEARS     Review of Systems  Constitutional: Negative for fever.  HENT: Negative for sore throat.   Eyes: Negative for visual disturbance.  Respiratory: Negative for cough and shortness of breath.   Cardiovascular: Negative for chest pain.  Gastrointestinal: Negative for nausea, vomiting, abdominal pain and diarrhea.  Genitourinary: Negative for dysuria.  Musculoskeletal: Negative for neck pain.  Skin: Negative for rash.  Neurological: Negative for headaches.  Psychiatric/Behavioral:  Negative for hallucinations, sleep disturbance, self-injury, dysphoric mood, decreased concentration and agitation. The patient is nervous/anxious.       Allergies  Review of patient's allergies indicates no known allergies.  Home Medications   Prior to Admission medications   Medication Sig Start Date End Date Taking? Authorizing Provider  amphetamine-dextroamphetamine (ADDERALL) 10 MG tablet Take 1 tablet (10 mg total) by mouth daily. 01/25/16   Volanda Napoleon, MD  celecoxib (CELEBREX) 100 MG capsule TAKE 1 CAPSULE (100 MG TOTAL) BY MOUTH 2 (TWO) TIMES DAILY. TAKE WITH FOOD. Patient not taking: Reported on 03/06/2016 03/10/15   Volanda Napoleon, MD  cetirizine (ZYRTEC) 10 MG tablet Take 10 mg by mouth daily as needed for allergies.     Historical Provider, MD  Cholecalciferol (VITAMIN D3) 2000 UNITS capsule Take 1 capsule (2,000 Units total) by mouth daily. Patient not taking: Reported on 03/06/2016 08/24/15   Evie Lacks Plotnikov, MD  clonazePAM (KLONOPIN) 1 MG tablet TAKE 1 TABLET BY MOUTH 3 TIMES DAILY AS NEEDED FOR ANXIETY 05/11/16   Cassandria Anger, MD  cyanocobalamin (,VITAMIN B-12,) 1000 MCG/ML injection Inject 1 mL (1,000 mcg total) into the muscle every 30 (thirty) days. On the 17th. 90 day supply Patient not taking: Reported on 03/06/2016 11/14/15   Volanda Napoleon, MD  diclofenac sodium (VOLTAREN) 1 % GEL APPLY TO AFFECTED AREA 4 TIMES DAILY 04/09/16   Cassandria Anger, MD  diphenoxylate-atropine (LOMOTIL) 2.5-0.025 MG per tablet Take 1-2 tablets by mouth 4 (four) times daily as needed for diarrhea or loose stools. Patient not taking: Reported on 03/06/2016 04/11/15   Volanda Napoleon, MD  hydrOXYzine (ATARAX/VISTARIL) 10 MG tablet Take 1 tablet (10 mg total) by mouth every 6 (six) hours as needed for anxiety. 05/26/16   Waynetta Pean, PA-C  levothyroxine (SYNTHROID, LEVOTHROID) 100 MCG tablet Take 1 tablet (100 mcg total) by mouth daily. 90 day supply. 11/14/15   Volanda Napoleon, MD  lipase/protease/amylase (CREON) 12000 UNITS CPEP capsule Take 1 capsule (12,000 Units total) by mouth 3 (three) times daily with meals. Patient not taking: Reported on 03/06/2016 04/11/15   Volanda Napoleon, MD  oxyCODONE-acetaminophen (PERCOCET) 10-325 MG tablet Take 1 tablet by mouth every 4 (four) hours as needed for pain. 1 by mouth every 4 hours as needed, DO NOT EXCEED 3000 mg OF APAP FROM ALL SOURCES/24HOURS 03/12/16   Volanda Napoleon, MD  ranitidine (ZANTAC) 300 MG tablet Take 1 tablet (300 mg total) by mouth at bedtime. Patient not taking: Reported on 03/06/2016 01/12/13   Cassandria Anger, MD  traZODone (DESYREL) 50 MG tablet TAKE 1 TO 2 TABLETS AT BEDTIME Patient taking differently: TAKE 50-100 MG BY MOUTH AT BEDTIME AS NEEDED FOR SLEEP  10/04/15   Cassandria Anger, MD  vemurafenib (ZELBORAF) 240 MG tablet Take 4 tablets (960 mg total) by mouth every 12 (twelve) hours. Take with water. 03/07/16   Volanda Napoleon, MD   BP 170/95 mmHg  Pulse 79  Temp(Src) 98 F (36.7 C) (Oral)  Resp 18  SpO2 100%  LMP 10/24/2011 Physical Exam  Constitutional: She is oriented to person, place, and time. She appears well-developed and well-nourished. No distress.  Nontoxic appearing.  HENT:  Head: Normocephalic and atraumatic.  Eyes: Conjunctivae are normal. Pupils are equal, round, and reactive to light. Right eye exhibits no discharge. Left eye exhibits no discharge.  Neck: Neck supple.  Cardiovascular: Normal rate, regular rhythm, normal heart sounds and intact distal pulses.  Pulmonary/Chest: Effort normal and breath sounds normal. No respiratory distress.  Abdominal: Soft. There is no tenderness.  Lymphadenopathy:    She has no cervical adenopathy.  Neurological: She is alert and oriented to person, place, and time. Coordination normal.  Skin: Skin is warm and dry. No rash noted. She is not diaphoretic.  Psychiatric: Her behavior is normal. Her mood appears anxious. Her affect is not angry. Her speech is not rapid and/or pressured. She is not hyperactive and not actively hallucinating. She does not exhibit a depressed mood. She expresses no homicidal and no suicidal ideation.  Patient appears anxious. She endorses anxiety. She does not appear depressed. Her speech is clear and coherent. She denies suicidal or homicidal ideations. She does not appear to be responding to internal stimuli. She denies visual or auditory hallucinations.  Nursing note and vitals reviewed.   ED Course  Procedures (including critical care time) Labs Review Labs Reviewed - No data to display  Imaging Review No results found.    EKG Interpretation None      Filed Vitals:   05/26/16 1700  BP: 170/95  Pulse: 79  Temp: 98 F (36.7 C)  TempSrc:  Oral  Resp: 18  SpO2: 100%     MDM   Meds given in ED:  Medications  hydrOXYzine (ATARAX/VISTARIL) tablet 25 mg (not administered)    New Prescriptions   HYDROXYZINE (ATARAX/VISTARIL) 10 MG TABLET    Take 1 tablet (10 mg total) by mouth every 6 (six) hours as needed for anxiety.    Final diagnoses:  Anxiety   This is a 65 y.o. female who presents to the ED complaining of worsening anxiety over the past several weeks. The patient tells me she went to a rehab facility to get off of her narcotic pain medicines and also gave up her Klonopin. She reports she should not have done this and she has experienced worsening anxiety since. She has a primary care provider in town but has not followed up with him. She reports increasing anxiety related to caring for her husband and taking care of her house. She denies SI or HI. She denies VH or AH. She is not followed by a therapist or a psychiatrist. She denies physical complaints.  On exam the patient is afebrile nontoxic appearing. She appears anxious. She endorses anxiety. She denies suicidal or homicidal ideations. Her speech is clear and coherent. She does not appear to be responding to internal stimuli. Will provide the patient with Atarax and discharge with some Atarax. I encouraged her to follow-up with her primary care provider and provided her with outpatient resources for follow-up with therapy and psychiatry. I discussed return precautions. I advised the patient to follow-up with their primary care provider this week. I advised the patient to return to the emergency department with new or worsening symptoms or new concerns. The patient verbalized understanding and agreement with plan.    This patient was discussed with Dr. Laneta Simmers who agrees with assessment and plan.   Waynetta Pean, PA-C 05/26/16 1805  Leo Grosser, MD 05/28/16 [phone]

## 2016-05-26 NOTE — ED Notes (Signed)
Bed: [facility] Expected date:  Expected time:  Means of arrival:  Comments: 15 F ETOH

## 2016-06-13 ENCOUNTER — Encounter (HOSPITAL_COMMUNITY): Payer: Self-pay | Admitting: Emergency Medicine

## 2016-06-13 ENCOUNTER — Emergency Department (HOSPITAL_COMMUNITY): Payer: BLUE CROSS/BLUE SHIELD

## 2016-06-13 ENCOUNTER — Emergency Department (HOSPITAL_COMMUNITY)
Admission: EM | Admit: 2016-06-13 | Discharge: 2016-06-13 | Disposition: A | Discharge status: Home / Self Care | Payer: BLUE CROSS/BLUE SHIELD | Attending: Emergency Medicine | Admitting: Emergency Medicine

## 2016-06-13 DIAGNOSIS — S8012XA Contusion of left lower leg, initial encounter: Secondary | ICD-10-CM | POA: Insufficient documentation

## 2016-06-13 DIAGNOSIS — Y939 Activity, unspecified: Secondary | ICD-10-CM | POA: Diagnosis not present

## 2016-06-13 DIAGNOSIS — Z791 Long term (current) use of non-steroidal anti-inflammatories (NSAID): Secondary | ICD-10-CM | POA: Diagnosis not present

## 2016-06-13 DIAGNOSIS — Z85850 Personal history of malignant neoplasm of thyroid: Secondary | ICD-10-CM | POA: Insufficient documentation

## 2016-06-13 DIAGNOSIS — T07XXXA Unspecified multiple injuries, initial encounter: Secondary | ICD-10-CM

## 2016-06-13 DIAGNOSIS — S5001XA Contusion of right elbow, initial encounter: Secondary | ICD-10-CM | POA: Diagnosis not present

## 2016-06-13 DIAGNOSIS — Y929 Unspecified place or not applicable: Secondary | ICD-10-CM | POA: Insufficient documentation

## 2016-06-13 DIAGNOSIS — F329 Major depressive disorder, single episode, unspecified: Secondary | ICD-10-CM | POA: Insufficient documentation

## 2016-06-13 DIAGNOSIS — Z87891 Personal history of nicotine dependence: Secondary | ICD-10-CM | POA: Insufficient documentation

## 2016-06-13 DIAGNOSIS — Z79899 Other long term (current) drug therapy: Secondary | ICD-10-CM | POA: Diagnosis not present

## 2016-06-13 DIAGNOSIS — S40022A Contusion of left upper arm, initial encounter: Secondary | ICD-10-CM | POA: Insufficient documentation

## 2016-06-13 DIAGNOSIS — S7011XA Contusion of right thigh, initial encounter: Secondary | ICD-10-CM | POA: Diagnosis not present

## 2016-06-13 DIAGNOSIS — M1990 Unspecified osteoarthritis, unspecified site: Secondary | ICD-10-CM | POA: Insufficient documentation

## 2016-06-13 DIAGNOSIS — T7411XA Adult physical abuse, confirmed, initial encounter: Secondary | ICD-10-CM | POA: Insufficient documentation

## 2016-06-13 DIAGNOSIS — F410 Panic disorder [episodic paroxysmal anxiety] without agoraphobia: Secondary | ICD-10-CM

## 2016-06-13 DIAGNOSIS — Y999 Unspecified external cause status: Secondary | ICD-10-CM | POA: Diagnosis not present

## 2016-06-13 DIAGNOSIS — T7491XA Unspecified adult maltreatment, confirmed, initial encounter: Secondary | ICD-10-CM

## 2016-06-13 DIAGNOSIS — S0083XA Contusion of other part of head, initial encounter: Secondary | ICD-10-CM | POA: Insufficient documentation

## 2016-06-13 DIAGNOSIS — S20229A Contusion of unspecified back wall of thorax, initial encounter: Secondary | ICD-10-CM | POA: Diagnosis not present

## 2016-06-13 LAB — COMPREHENSIVE METABOLIC PANEL
ALBUMIN: 4.5 g/dL (ref 3.5–5.0)
ALT: 76 U/L — ABNORMAL HIGH (ref 14–54)
AST: 140 U/L — AB (ref 15–41)
Alkaline Phosphatase: 120 U/L (ref 38–126)
Anion gap: 12 (ref 5–15)
BILIRUBIN TOTAL: 0.9 mg/dL (ref 0.3–1.2)
BUN: 9 mg/dL (ref 6–20)
CO2: 25 mmol/L (ref 22–32)
Calcium: 8.4 mg/dL — ABNORMAL LOW (ref 8.9–10.3)
Chloride: 87 mmol/L — ABNORMAL LOW (ref 101–111)
Creatinine, Ser: 0.54 mg/dL (ref 0.44–1.00)
GFR calc Af Amer: >60 mL/min (ref >60)
GFR calc non Af Amer: >60 mL/min (ref >60)
GLUCOSE: 159 mg/dL — AB (ref 65–99)
POTASSIUM: 3.5 mmol/L (ref 3.5–5.1)
Sodium: 124 mmol/L — ABNORMAL LOW (ref 135–145)
TOTAL PROTEIN: 7.3 g/dL (ref 6.5–8.1)

## 2016-06-13 LAB — TSH: TSH: 58.635 u[IU]/mL — AB (ref 0.350–4.500)

## 2016-06-13 LAB — SALICYLATE LEVEL: Salicylate Lvl: <4 mg/dL (ref 2.8–30.0)

## 2016-06-13 LAB — CBC WITH DIFFERENTIAL/PLATELET
Basophils Absolute: 0 10*3/uL (ref 0.0–0.1)
Basophils Relative: 0 %
Eosinophils Absolute: 0 10*3/uL (ref 0.0–0.7)
Eosinophils Relative: 0 %
HEMATOCRIT: 35 % — AB (ref 36.0–46.0)
HEMOGLOBIN: 12.3 g/dL (ref 12.0–15.0)
LYMPHS ABS: 0.5 10*3/uL — AB (ref 0.7–4.0)
LYMPHS PCT: 5 %
MCH: 29.5 pg (ref 26.0–34.0)
MCHC: 35.1 g/dL (ref 30.0–36.0)
MCV: 83.9 fL (ref 78.0–100.0)
MONO ABS: 0.6 10*3/uL (ref 0.1–1.0)
MONOS PCT: 6 %
NEUTROS ABS: 8.9 10*3/uL — AB (ref 1.7–7.7)
NEUTROS PCT: 89 %
Platelets: 202 10*3/uL (ref 150–400)
RBC: 4.17 MIL/uL (ref 3.87–5.11)
RDW: 15.2 % (ref 11.5–15.5)
WBC: 10.1 10*3/uL (ref 4.0–10.5)

## 2016-06-13 LAB — RAPID URINE DRUG SCREEN, HOSP PERFORMED
Amphetamines: NOT DETECTED
Barbiturates: NOT DETECTED
Benzodiazepines: NOT DETECTED
COCAINE: NOT DETECTED
OPIATES: NOT DETECTED
Tetrahydrocannabinol: NOT DETECTED

## 2016-06-13 LAB — ETHANOL: Alcohol, Ethyl (B): <5 mg/dL (ref <5)

## 2016-06-13 LAB — ACETAMINOPHEN LEVEL: Acetaminophen (Tylenol), Serum: <10 ug/mL — ABNORMAL LOW (ref 10–30)

## 2016-06-13 MED ORDER — TRAMADOL HCL 50 MG PO TABS: 50.0000 mg | ORAL_TABLET | Freq: Four times a day (QID) | ORAL | Status: DC | PRN

## 2016-06-13 NOTE — ED Notes (Signed)
Pt placed in conference room & is to be picked up per Marzetta Board, Agricultural consultant
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
## 2016-06-13 NOTE — Progress Notes (Signed)
EDCM approached by EDRN asking if was familiar with patient.  Patient seen earlier in shift by am SW.  EDRN reports patient states no one has seen her today.  EDCM consulted pm SW to see patient.

## 2016-06-13 NOTE — ED Notes (Signed)
Patient transported to Radiology 

## 2016-06-13 NOTE — ED Notes (Signed)
Spoke with Dr. Kathrynn Humble.  Per Dr. Tammy Sours, pt is good to go home.
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
## 2016-06-13 NOTE — Discharge Instructions (Signed)
Domestic Violence Information WHAT IS DOMESTIC VIOLENCE?  Domestic violence, also called intimate partner violence, can involve physical, emotional, psychological, sexual, and economic abuse by a current or former intimate partner. Stalking is also considered a type of domestic violence. Domestic violence can happen between people who are or were:   Married.  Dating.  Living together. Abusers repeatedly act to maintain control and power over their partner. Physical abuse can include:  Slapping.   Hitting.   Kicking.   Punching.  Choking.  Pulling the victim's hair.  Damaging the victim's property.  Threatening or hurting the victim with weapons.  Trapping the victim in his or her home.  Forcing the victim to use drugs or alcohol. Emotional and psychological abuse can include:  Threats.  Insults.   Isolation.   Humiliation.  Jealousy and possessiveness.  Blame.  Withholding affection.   Intimidation.   Manipulation.   Limiting contact with friends and family.  Sexual abuse can include:  Forcing sex.   Forcing sexual touching.   Hurting the victim during sex.  Forcing the victim to have sex with other people.  Giving the victim a sexually transmitted disease (STD) on purpose. Economic abuse can include:  Controlling resources, such as money, food, transportation, a phone, or computer.  Stealing money from the victim or his or her family or friends.  Forbidding the victim to work.  Refusing to work or to contribute to the household. Stalking can include:  Making repeated, unwanted phone calls, emails, or text messages.  Leaving cards, letters, flowers or other items the victim does not want.  Watching or following the victim from a distance.  Going to places where the victim does not want the abuser.  Entering the victim's home or car.  Damaging the victim's personal property. WHAT ARE SOME WARNING SIGNS OF DOMESTIC  VIOLENCE?  Physical signs  Bruises.   Broken bones.   Burns or cuts.   Physical pain.   Head injury. Emotional and psychological signs  Crying.   Depression.   Hopelessness.   Desperation.   Trouble sleeping.   Fear of partner.   Anxiety.  Suicidal behavior.  Antisocial behavior.  Low self-esteem.  Fear of intimacy.  Flashbacks. Sexual signs  Bruising, swelling, or bleeding of the genital or rectal area.   Signs of a sexually transmitted infection, such as genital sores, warts, or discharge coming from the genital area.   Pain in the genital area.   Unintended pregnancy.  Problems with pregnancy, including delayed prenatal care and prematurity. Economic signs  Having little money or food.   Homelessness.  Asking for or borrowing money. WHAT ARE COMMON BEHAVIORS OF THOSE AFFECTED BY DOMESTIC VIOLENCE? Those affected by domestic violence may:   Be late to work or other events.   Not show up to places as promised.   Have to let their partner know where they are and who they are with.   Be isolated or kept from seeing friends or family.   Make comments about their partner's temper or behavior.   Make excuses for their partner.   Engage in high-risk sexual behaviors.  Use drugs or alcohol.  Have unhealthy diet-related behaviors. WHAT ARE COMMON FEELINGS OF THOSE AFFECTED BY DOMESTIC VIOLENCE?  Those affected by domestic violence may feel that:   They have to be careful not to say or do things that trigger their partner's anger.   They cannot do anything right.   They deserve to be treated badly.  They are overreacting to their partner's behavior or temper.   They cannot trust their own feelings.   They cannot trust other people.   They are trapped.   Their partner would take away their children.   They are emotionally drained or numb.   Their life is in danger.   They might have to kill  their partner to survive.  WHERE CAN YOU GET HELP?  Domestic violence hotlines and websites If you do not feel safe searching for help online at home, use a computer at [facility] to access [employer]. Call 911 if you are in immediate danger or need medical help.   The [employer].  The 24-hour phone hotline is 270-843-1512 or 775-659-2357 (TTY).   The videophone is available Monday through Friday, 9 a.m. to 5 p.m. Call 978-800-5728.  The website is http://thehotline.org  The National Sexual Assault Hotline.  The 24-hour phone hotline is 214-437-3172.  You can access the online hotline at http://www.dixon.info/ Shelters for victims of domestic violence  If you are a victim of domestic violence, there are resources to help you find a temporary place for you and your children to live (shelter). The specific address of these shelters is often not known to the public.  Police Report assaults, threats, and stalking to the police.  Counselors and counseling centers People who have been victims of domestic violence can benefit from counseling. Counseling can help you cope with difficult emotions and empower you to plan for your future safety. The topics you discuss with a counselor are private and confidential. Children of domestic violence victims also might need counseling to manage stress and anxiety.  The court system You can work with a Chief Executive Officer or an advocate to get legal protection against an abuser. Protection includes restraining orders and private addresses. Crimes against you, such as assault, can also be prosecuted through the courts. Laws vary by state.   This information is not intended to replace advice given to you by your health care provider. Make sure you discuss any questions you have with your health care provider.   Document Released: 02/09/2004 Document Revised: 12/10/2014 Document Reviewed: 08/06/2014 Elsevier Interactive  Patient Education 2016 [place] A contusion is a deep bruise. Contusions are the result of a blunt injury to tissues and muscle fibers under the skin. The injury causes bleeding under the skin. The skin overlying the contusion may turn blue, purple, or yellow. Minor injuries will give you a painless contusion, but more severe contusions may stay painful and swollen for a few weeks.  CAUSES  This condition is usually caused by a blow, trauma, or direct force to an area of the body. SYMPTOMS  Symptoms of this condition include:  Swelling of the injured area.  Pain and tenderness in the injured area.  Discoloration. The area may have redness and then turn blue, purple, or yellow. DIAGNOSIS  This condition is diagnosed based on a physical exam and medical history. An X-ray, CT scan, or MRI may be needed to determine if there are any associated injuries, such as broken bones (fractures). TREATMENT  Specific treatment for this condition depends on what area of the body was injured. In general, the best treatment for a contusion is resting, icing, applying pressure to (compression), and elevating the injured area. This is often called the RICE strategy. Over-the-counter anti-inflammatory medicines may also be recommended for pain control.  HOME CARE INSTRUCTIONS   Rest the injured area.  If  directed, apply ice to the injured area:  Put ice in a plastic bag.  Place a towel between your skin and the bag.  Leave the ice on for 20 minutes, 2-3 times per day.  If directed, apply light compression to the injured area using an elastic bandage. Make sure the bandage is not wrapped too tightly. Remove and reapply the bandage as directed by your health care provider.  If possible, raise (elevate) the injured area above the level of your heart while you are sitting or lying down.  Take over-the-counter and prescription medicines only as told by your health care provider. SEEK MEDICAL  CARE IF:  Your symptoms do not improve after several days of treatment.  Your symptoms get worse.  You have difficulty moving the injured area. SEEK IMMEDIATE MEDICAL CARE IF:   You have severe pain.  You have numbness in a hand or foot.  Your hand or foot turns pale or cold.   This information is not intended to replace advice given to you by your health care provider. Make sure you discuss any questions you have with your health care provider.   Document Released: 08/29/2005 Document Revised: 08/10/2015 Document Reviewed: 04/06/2015 Elsevier Interactive Patient Education [employer].

## 2016-06-13 NOTE — ED Notes (Signed)
Susan Mccarthy [phone] (husband)

## 2016-06-19 ENCOUNTER — Telehealth: Payer: Self-pay | Admitting: Internal Medicine

## 2016-06-19 MED ORDER — CLONAZEPAM 1 MG PO TABS: ORAL_TABLET | ORAL | Status: DC
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
## 2016-06-19 NOTE — Telephone Encounter (Signed)
Patient called to ask for an immediate refill of clonazePAM (KLONOPIN) 1 MG tablet [id number]. I advised that I would get the info to you now, but we do ask for 24-48 hours for prescription refills. She advised me that if you do not fill it immediatly she will consult an attny. She then stated that she cant wait 24 hours, that she will loose faith in doctors again, and that she will seek care elsewhere. I told her that I will still send this in the meantime, so you get it. She refused an appt and stated that she will no longer needyour services because you cant take proper care of her.   Just FYI

## 2016-06-19 NOTE — Addendum Note (Signed)
Addended by: Cassandria Anger on: 06/19/2016 05:15 PM   Modules accepted: Orders

## 2016-06-20 ENCOUNTER — Ambulatory Visit: Payer: BLUE CROSS/BLUE SHIELD | Admitting: Internal Medicine

## 2016-06-20 NOTE — Telephone Encounter (Signed)
Called patient, and it went to vm (which is full).

## 2016-06-21 ENCOUNTER — Emergency Department (HOSPITAL_COMMUNITY)
Admission: EM | Admit: 2016-06-21 | Discharge: 2016-06-22 | Payer: BLUE CROSS/BLUE SHIELD | Attending: Emergency Medicine | Admitting: Emergency Medicine

## 2016-06-21 ENCOUNTER — Encounter (HOSPITAL_COMMUNITY): Payer: Self-pay

## 2016-06-21 DIAGNOSIS — Z79899 Other long term (current) drug therapy: Secondary | ICD-10-CM | POA: Insufficient documentation

## 2016-06-21 DIAGNOSIS — Z85850 Personal history of malignant neoplasm of thyroid: Secondary | ICD-10-CM | POA: Insufficient documentation

## 2016-06-21 DIAGNOSIS — F17210 Nicotine dependence, cigarettes, uncomplicated: Secondary | ICD-10-CM | POA: Insufficient documentation

## 2016-06-21 DIAGNOSIS — Z7982 Long term (current) use of aspirin: Secondary | ICD-10-CM | POA: Diagnosis not present

## 2016-06-21 DIAGNOSIS — Z0000 Encounter for general adult medical examination without abnormal findings: Secondary | ICD-10-CM

## 2016-06-21 DIAGNOSIS — F332 Major depressive disorder, recurrent severe without psychotic features: Secondary | ICD-10-CM | POA: Diagnosis not present

## 2016-06-21 DIAGNOSIS — F419 Anxiety disorder, unspecified: Secondary | ICD-10-CM | POA: Diagnosis present

## 2016-06-21 DIAGNOSIS — F1010 Alcohol abuse, uncomplicated: Secondary | ICD-10-CM | POA: Diagnosis not present

## 2016-06-21 LAB — COMPREHENSIVE METABOLIC PANEL
ALK PHOS: 113 U/L (ref 38–126)
ALT: 33 U/L (ref 14–54)
AST: 37 U/L (ref 15–41)
Albumin: 4.4 g/dL (ref 3.5–5.0)
Anion gap: 9 (ref 5–15)
BUN: 13 mg/dL (ref 6–20)
CALCIUM: 8.9 mg/dL (ref 8.9–10.3)
CHLORIDE: 100 mmol/L — AB (ref 101–111)
CO2: 25 mmol/L (ref 22–32)
CREATININE: 0.66 mg/dL (ref 0.44–1.00)
Glucose, Bld: 142 mg/dL — ABNORMAL HIGH (ref 65–99)
Potassium: 3.5 mmol/L (ref 3.5–5.1)
Sodium: 134 mmol/L — ABNORMAL LOW (ref 135–145)
Total Bilirubin: 0.3 mg/dL (ref 0.3–1.2)
Total Protein: 7 g/dL (ref 6.5–8.1)

## 2016-06-21 LAB — CBC WITH DIFFERENTIAL/PLATELET
BASOS ABS: 0.1 10*3/uL (ref 0.0–0.1)
Basophils Relative: 1 %
Eosinophils Absolute: 0.1 10*3/uL (ref 0.0–0.7)
Eosinophils Relative: 2 %
HEMATOCRIT: 37.8 % (ref 36.0–46.0)
HEMOGLOBIN: 13 g/dL (ref 12.0–15.0)
LYMPHS ABS: 1.8 10*3/uL (ref 0.7–4.0)
LYMPHS PCT: 29 %
MCH: 29.9 pg (ref 26.0–34.0)
MCHC: 34.4 g/dL (ref 30.0–36.0)
MCV: 86.9 fL (ref 78.0–100.0)
Monocytes Absolute: 0.7 10*3/uL (ref 0.1–1.0)
Monocytes Relative: 11 %
NEUTROS ABS: 3.6 10*3/uL (ref 1.7–7.7)
NEUTROS PCT: 57 %
PLATELETS: 284 10*3/uL (ref 150–400)
RBC: 4.35 MIL/uL (ref 3.87–5.11)
RDW: 15.4 % (ref 11.5–15.5)
WBC: 6.3 10*3/uL (ref 4.0–10.5)

## 2016-06-21 LAB — RAPID URINE DRUG SCREEN, HOSP PERFORMED
AMPHETAMINES: NOT DETECTED
Barbiturates: NOT DETECTED
Benzodiazepines: NOT DETECTED
Cocaine: NOT DETECTED
OPIATES: NOT DETECTED
TETRAHYDROCANNABINOL: NOT DETECTED

## 2016-06-21 LAB — ETHANOL: ALCOHOL ETHYL (B): 55 mg/dL — AB (ref <5)

## 2016-06-21 MED ORDER — LEVOTHYROXINE SODIUM 100 MCG PO TABS: 100.0000 ug | ORAL_TABLET | Freq: Every day | ORAL | Status: DC

## 2016-06-21 MED ORDER — LEVOTHYROXINE SODIUM 100 MCG PO TABS
100.0000 ug | ORAL_TABLET | Freq: Every day | ORAL | Status: DC
  Administered 2016-06-22: 100 ug via ORAL
  Filled 2016-06-21 (×2): qty 1

## 2016-06-21 MED ORDER — ONDANSETRON HCL 4 MG PO TABS: 4.0000 mg | ORAL_TABLET | Freq: Three times a day (TID) | ORAL | Status: DC | PRN

## 2016-06-21 MED ORDER — ASPIRIN 325 MG PO TABS
650.0000 mg | ORAL_TABLET | ORAL | Status: DC | PRN
  Administered 2016-06-22: 650 mg via ORAL
  Filled 2016-06-21: qty 2

## 2016-06-21 MED ORDER — TRAMADOL HCL 50 MG PO TABS
50.0000 mg | ORAL_TABLET | Freq: Four times a day (QID) | ORAL | Status: DC | PRN
  Administered 2016-06-21 – 2016-06-22 (×3): 50 mg via ORAL
  Filled 2016-06-21 (×3): qty 1

## 2016-06-21 MED ORDER — NICOTINE 21 MG/24HR TD PT24
21.0000 mg | MEDICATED_PATCH | Freq: Every day | TRANSDERMAL | Status: DC
  Filled 2016-06-21 (×2): qty 1

## 2016-06-21 MED ORDER — ALUM & MAG HYDROXIDE-SIMETH 200-200-20 MG/5ML PO SUSP: 30.0000 mL | ORAL | Status: DC | PRN

## 2016-06-21 MED ORDER — HYDROXYZINE HCL 10 MG PO TABS
10.0000 mg | ORAL_TABLET | Freq: Four times a day (QID) | ORAL | Status: DC | PRN
  Filled 2016-06-21: qty 1

## 2016-06-21 MED ORDER — LORAZEPAM 1 MG PO TABS: 0.0000 mg | ORAL_TABLET | Freq: Two times a day (BID) | ORAL | Status: DC

## 2016-06-21 MED ORDER — LORAZEPAM 1 MG PO TABS
0.0000 mg | ORAL_TABLET | Freq: Four times a day (QID) | ORAL | Status: DC
  Administered 2016-06-21: 2 mg via ORAL
  Administered 2016-06-22: 1 mg via ORAL
  Administered 2016-06-22: 2 mg via ORAL
  Administered 2016-06-22: 1 mg via ORAL
  Filled 2016-06-21: qty 1
  Filled 2016-06-21 (×2): qty 2
  Filled 2016-06-21: qty 1

## 2016-06-21 NOTE — ED Notes (Signed)
Patient refused labs. Triage RN made aware.

## 2016-06-21 NOTE — ED Provider Notes (Signed)
CSN: [id number]     Arrival date & time 06/21/16  1426 History   First MD Initiated Contact with Patient 06/21/16 1455     Chief Complaint  Patient presents with  . Anxiety  . Detox      (Consider location/radiation/quality/duration/timing/severity/associated sxs/prior Treatment) HPI Comments: 65 year old female presents complaining of increased depression with anxiety as well as requesting detox from alcohol. Denies any suicidal or homicidal ideations. No abdominal or chest pain. Denies responding to internal simulation. Patient's last use of alcohol was today. She is also use benzodiazepines in the past. Denies any tremors. Denies any hallucinations. No treatment use prior to arrival  Patient is a 65 y.o. female presenting with anxiety. The history is provided by the patient.  Anxiety    Past Medical History  Diagnosis Date  . History of chemotherapy     INTERFERON  . Anxiety   . Depression   . Thyroid disease     85 % BENIGN  . Insomnia   . Arthritis   . Melanoma ([place]) 1980    RIGHT BACK  . Thyroid ca [facility])    Past Surgical History  Procedure Laterality Date  . Excision of melanoma  1980  . Axillary node dissection  08/02/11    right axillary,  . Rash      ON FACE AND HANDS S/P CHEMOTHERAPY  . Thyroidectomy  1/14    cancer  . Sacro-iliac pinning Bilateral 08/27/2013    Procedure: SACRO-ILIAC PINNING;  Surgeon: Rozanna Box, MD;  Location: [place];  Service: Orthopedics;  Laterality: Bilateral;  Handy Bed, OIC Cannulated Screws  . Percutaneous pinning Left 08/27/2013    Procedure: PERCUTANEOUS PINNING EXTREMITY;  Surgeon: Rozanna Box, MD;  Location: [place];  Service: Orthopedics;  Laterality: Left;   Family History  Problem Relation Age of Onset  . ALS Father    Social History  Substance Use Topics  . Smoking status: Former Smoker -- 0.25 packs/day for 5 years    Types: Cigarettes    Start date: 01/11/1974    Quit date: 06/07/1979  . Smokeless tobacco: Never  Used     Comment: quit 35 years  . Alcohol Use: 0.0 oz/week    0 Standard drinks or equivalent per week     Comment: RARE OCCASIONAL    OB History    Gravida Para Term Preterm AB TAB SAB Ectopic Multiple Living   0               Obstetric Comments   MENARCHE TENNAGER 10, NO HRT, BIRTH CONTROL PILLS 2 YEARS     Review of Systems  All other systems reviewed and are negative.     Allergies  Review of patient's allergies indicates no known allergies.  Home Medications   Prior to Admission medications   Medication Sig Start Date End Date Taking? Authorizing Provider  amphetamine-dextroamphetamine (ADDERALL) 10 MG tablet Take 1 tablet (10 mg total) by mouth daily. Patient not taking: Reported on 06/13/2016 01/25/16   Volanda Napoleon, MD  aspirin 325 MG tablet Take 650 mg by mouth every 4 (four) hours as needed for mild pain, moderate pain, fever or headache.     Historical Provider, MD  celecoxib (CELEBREX) 100 MG capsule TAKE 1 CAPSULE (100 MG TOTAL) BY MOUTH 2 (TWO) TIMES DAILY. TAKE WITH FOOD. Patient not taking: Reported on 03/06/2016 03/10/15   Volanda Napoleon, MD  Cholecalciferol (VITAMIN D3) 2000 UNITS capsule Take 1 capsule (2,000 Units total) by  mouth daily. Patient not taking: Reported on 03/06/2016 08/24/15   Evie Lacks Plotnikov, MD  clonazePAM (KLONOPIN) 1 MG tablet TAKE 1 TABLET BY MOUTH 3 TIMES DAILY AS NEEDED FOR ANXIETY 06/19/16   Cassandria Anger, MD  cyanocobalamin (,VITAMIN B-12,) 1000 MCG/ML injection Inject 1 mL (1,000 mcg total) into the muscle every 30 (thirty) days. On the 17th. 90 day supply Patient not taking: Reported on 03/06/2016 11/14/15   Volanda Napoleon, MD  diclofenac sodium (VOLTAREN) 1 % GEL APPLY TO AFFECTED AREA 4 TIMES DAILY Patient not taking: Reported on 06/13/2016 04/09/16   Cassandria Anger, MD  diphenoxylate-atropine (LOMOTIL) 2.5-0.025 MG per tablet Take 1-2 tablets by mouth 4 (four) times daily as needed for diarrhea or loose stools. Patient not  taking: Reported on 03/06/2016 04/11/15   Volanda Napoleon, MD  hydrOXYzine (ATARAX/VISTARIL) 10 MG tablet Take 1 tablet (10 mg total) by mouth every 6 (six) hours as needed for anxiety. 05/26/16   Waynetta Pean, PA-C  levothyroxine (SYNTHROID, LEVOTHROID) 100 MCG tablet Take 1 tablet (100 mcg total) by mouth daily. 90 day supply. 11/14/15   Volanda Napoleon, MD  lipase/protease/amylase (CREON) 12000 UNITS CPEP capsule Take 1 capsule (12,000 Units total) by mouth 3 (three) times daily with meals. Patient not taking: Reported on 03/06/2016 04/11/15   Volanda Napoleon, MD  oxyCODONE-acetaminophen (PERCOCET) 10-325 MG tablet Take 1 tablet by mouth every 4 (four) hours as needed for pain. 1 by mouth every 4 hours as needed, DO NOT EXCEED 3000 mg OF APAP FROM ALL SOURCES/24HOURS 03/12/16   Volanda Napoleon, MD  ranitidine (ZANTAC) 300 MG tablet Take 1 tablet (300 mg total) by mouth at bedtime. Patient not taking: Reported on 03/06/2016 01/12/13   Evie Lacks Plotnikov, MD  traMADol (ULTRAM) 50 MG tablet Take 1 tablet (50 mg total) by mouth every 6 (six) hours as needed for moderate pain. 06/13/16   Domenic Moras, PA-C  traZODone (DESYREL) 50 MG tablet TAKE 1 TO 2 TABLETS AT BEDTIME 10/04/15   Cassandria Anger, MD  vemurafenib (ZELBORAF) 240 MG tablet Take 4 tablets (960 mg total) by mouth every 12 (twelve) hours. Take with water. Patient not taking: Reported on 06/13/2016 03/07/16   Volanda Napoleon, MD   BP 124/86 mmHg  Pulse 84  Temp(Src) 98.5 F (36.9 C) (Oral)  Resp 18  SpO2 96%  LMP 10/24/2011 Physical Exam  Constitutional: She is oriented to person, place, and time. She appears well-developed and well-nourished.  Non-toxic appearance. No distress.  HENT:  Head: Normocephalic and atraumatic.  Eyes: Conjunctivae, EOM and lids are normal. Pupils are equal, round, and reactive to light.  Neck: Normal range of motion. Neck supple. No tracheal deviation present. No thyroid mass present.  Cardiovascular: Normal rate,  regular rhythm and normal heart sounds.  Exam reveals no gallop.   No murmur heard. Pulmonary/Chest: Effort normal and breath sounds normal. No stridor. No respiratory distress. She has no decreased breath sounds. She has no wheezes. She has no rhonchi. She has no rales.  Abdominal: Soft. Normal appearance and bowel sounds are normal. She exhibits no distension. There is no tenderness. There is no rebound and no CVA tenderness.  Musculoskeletal: Normal range of motion. She exhibits no edema or tenderness.  Neurological: She is alert and oriented to person, place, and time. She has normal strength. No cranial nerve deficit or sensory deficit. GCS eye subscore is 4. GCS verbal subscore is 5. GCS motor subscore is 6.  Skin: Skin is warm and dry. No abrasion and no rash noted.  Psychiatric: She has a normal mood and affect. Her speech is normal and behavior is normal. She expresses no homicidal and no suicidal ideation.  Nursing note and vitals reviewed.   ED Course  Procedures (including critical care time) Labs Review Labs Reviewed - No data to display  Imaging Review No results found. I have personally reviewed and evaluated these images and lab results as part of my medical decision-making.   EKG Interpretation None      MDM   Final diagnoses:  None    Patient is not hypertensive or tachycardic at this time. No evidence of acute withdrawal at this time requiring inpatient hospitalization To a medical floor. States that her depression and anxiety has been severe which is causing her to consume more alcohol. Will consult TTS for placement    Lacretia Leigh, MD 06/21/16 1528
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
## 2016-06-21 NOTE — ED Notes (Addendum)
Pt presents via EMS from home with c/o anxiety and detox. Pt reports that she is here for detox from alcohol. EMS vitals CBG 94, 150/90, HR 88, R20. Pt reports she has been drinking white wine but unsure of how much. Pt denies any thoughts of SI/HI.
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
## 2016-06-21 NOTE — ED Notes (Signed)
Spoke with Dr. Zenia Resides about patients desire to leave and he said she could leave AMA.

## 2016-06-21 NOTE — ED Notes (Signed)
Patient wanded by security. 
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
## 2016-06-21 NOTE — ED Notes (Signed)
Pt reported that she wanted to leave but has now changed her mind and reported that she is going to stay. [facility]

## 2016-06-22 ENCOUNTER — Emergency Department (HOSPITAL_COMMUNITY): Payer: BLUE CROSS/BLUE SHIELD

## 2016-06-22 ENCOUNTER — Inpatient Hospital Stay
Admission: RE | Admit: 2016-06-22 | Discharge: 2016-06-29 | DRG: 885 | Disposition: A | Discharge status: Home / Self Care | Payer: BLUE CROSS/BLUE SHIELD | Source: Intra-hospital | Attending: Psychiatry | Admitting: Psychiatry

## 2016-06-22 DIAGNOSIS — E890 Postprocedural hypothyroidism: Secondary | ICD-10-CM | POA: Diagnosis present

## 2016-06-22 DIAGNOSIS — Z91410 Personal history of adult physical and sexual abuse: Secondary | ICD-10-CM

## 2016-06-22 DIAGNOSIS — C439 Malignant melanoma of skin, unspecified: Secondary | ICD-10-CM | POA: Diagnosis present

## 2016-06-22 DIAGNOSIS — F064 Anxiety disorder due to known physiological condition: Secondary | ICD-10-CM | POA: Diagnosis present

## 2016-06-22 DIAGNOSIS — F17200 Nicotine dependence, unspecified, uncomplicated: Secondary | ICD-10-CM | POA: Diagnosis present

## 2016-06-22 DIAGNOSIS — F10239 Alcohol dependence with withdrawal, unspecified: Secondary | ICD-10-CM

## 2016-06-22 DIAGNOSIS — F502 Bulimia nervosa, unspecified: Secondary | ICD-10-CM

## 2016-06-22 DIAGNOSIS — C799 Secondary malignant neoplasm of unspecified site: Secondary | ICD-10-CM | POA: Diagnosis present

## 2016-06-22 DIAGNOSIS — Z0000 Encounter for general adult medical examination without abnormal findings: Secondary | ICD-10-CM | POA: Diagnosis not present

## 2016-06-22 DIAGNOSIS — Z8659 Personal history of other mental and behavioral disorders: Secondary | ICD-10-CM | POA: Diagnosis not present

## 2016-06-22 DIAGNOSIS — M1990 Unspecified osteoarthritis, unspecified site: Secondary | ICD-10-CM | POA: Diagnosis present

## 2016-06-22 DIAGNOSIS — F322 Major depressive disorder, single episode, severe without psychotic features: Secondary | ICD-10-CM | POA: Diagnosis not present

## 2016-06-22 DIAGNOSIS — Z9889 Other specified postprocedural states: Secondary | ICD-10-CM | POA: Diagnosis not present

## 2016-06-22 DIAGNOSIS — F319 Bipolar disorder, unspecified: Principal | ICD-10-CM | POA: Diagnosis present

## 2016-06-22 DIAGNOSIS — F1020 Alcohol dependence, uncomplicated: Secondary | ICD-10-CM | POA: Diagnosis not present

## 2016-06-22 DIAGNOSIS — K219 Gastro-esophageal reflux disease without esophagitis: Secondary | ICD-10-CM | POA: Diagnosis present

## 2016-06-22 DIAGNOSIS — Z85850 Personal history of malignant neoplasm of thyroid: Secondary | ICD-10-CM | POA: Diagnosis not present

## 2016-06-22 DIAGNOSIS — F332 Major depressive disorder, recurrent severe without psychotic features: Secondary | ICD-10-CM | POA: Diagnosis present

## 2016-06-22 DIAGNOSIS — F1120 Opioid dependence, uncomplicated: Secondary | ICD-10-CM

## 2016-06-22 DIAGNOSIS — F3110 Bipolar disorder, current episode manic without psychotic features, unspecified: Secondary | ICD-10-CM | POA: Diagnosis not present

## 2016-06-22 DIAGNOSIS — G4700 Insomnia, unspecified: Secondary | ICD-10-CM | POA: Diagnosis present

## 2016-06-22 DIAGNOSIS — F10230 Alcohol dependence with withdrawal, uncomplicated: Secondary | ICD-10-CM | POA: Diagnosis not present

## 2016-06-22 DIAGNOSIS — Z85820 Personal history of malignant melanoma of skin: Secondary | ICD-10-CM | POA: Diagnosis not present

## 2016-06-22 DIAGNOSIS — C73 Malignant neoplasm of thyroid gland: Secondary | ICD-10-CM | POA: Diagnosis present

## 2016-06-22 DIAGNOSIS — F1010 Alcohol abuse, uncomplicated: Secondary | ICD-10-CM

## 2016-06-22 DIAGNOSIS — Z9221 Personal history of antineoplastic chemotherapy: Secondary | ICD-10-CM

## 2016-06-22 DIAGNOSIS — Z818 Family history of other mental and behavioral disorders: Secondary | ICD-10-CM

## 2016-06-22 DIAGNOSIS — F10939 Alcohol use, unspecified with withdrawal, unspecified: Secondary | ICD-10-CM

## 2016-06-22 DIAGNOSIS — F3160 Bipolar disorder, current episode mixed, unspecified: Secondary | ICD-10-CM

## 2016-06-22 LAB — URINALYSIS W MICROSCOPIC (NOT AT ARMC)
BILIRUBIN URINE: NEGATIVE
Glucose, UA: NEGATIVE mg/dL
Hgb urine dipstick: NEGATIVE
KETONES UR: NEGATIVE mg/dL
Nitrite: POSITIVE — AB
PH: 6.5 (ref 5.0–8.0)
Protein, ur: NEGATIVE mg/dL
Specific Gravity, Urine: 1.017 (ref 1.005–1.030)

## 2016-06-22 MED ORDER — NICOTINE 21 MG/24HR TD PT24
21.0000 mg | MEDICATED_PATCH | Freq: Every day | TRANSDERMAL | Status: DC
  Administered 2016-06-28 – 2016-06-29 (×2): 21 mg via TRANSDERMAL
  Filled 2016-06-22 (×5): qty 1

## 2016-06-22 MED ORDER — CHLORDIAZEPOXIDE HCL 25 MG PO CAPS
25.0000 mg | ORAL_CAPSULE | Freq: Four times a day (QID) | ORAL | Status: DC
  Administered 2016-06-22 – 2016-06-24 (×6): 25 mg via ORAL
  Filled 2016-06-22 (×6): qty 1

## 2016-06-22 MED ORDER — ACETAMINOPHEN 500 MG PO TABS
1000.0000 mg | ORAL_TABLET | Freq: Four times a day (QID) | ORAL | Status: DC | PRN
  Administered 2016-06-23 – 2016-06-29 (×8): 1000 mg via ORAL
  Filled 2016-06-22 (×8): qty 2

## 2016-06-22 MED ORDER — CELECOXIB 100 MG PO CAPS
100.0000 mg | ORAL_CAPSULE | Freq: Two times a day (BID) | ORAL | Status: DC | PRN
  Administered 2016-06-22: 100 mg via ORAL
  Filled 2016-06-22 (×2): qty 1

## 2016-06-22 MED ORDER — ALUM & MAG HYDROXIDE-SIMETH 200-200-20 MG/5ML PO SUSP: 30.0000 mL | ORAL | Status: DC | PRN

## 2016-06-22 MED ORDER — TRAZODONE HCL 50 MG PO TABS
50.0000 mg | ORAL_TABLET | Freq: Every day | ORAL | Status: DC
  Administered 2016-06-22 – 2016-06-23 (×2): 50 mg via ORAL
  Filled 2016-06-22 (×2): qty 1

## 2016-06-22 MED ORDER — ONDANSETRON HCL 4 MG PO TABS: 4.0000 mg | ORAL_TABLET | Freq: Three times a day (TID) | ORAL | Status: DC | PRN

## 2016-06-22 MED ORDER — DICLOFENAC SODIUM 1 % TD GEL
4.0000 g | Freq: Three times a day (TID) | TRANSDERMAL | Status: DC | PRN
  Administered 2016-06-23 – 2016-06-28 (×7): 4 g via TOPICAL
  Filled 2016-06-22: qty 100

## 2016-06-22 MED ORDER — DICLOFENAC SODIUM 1 % TD GEL
4.0000 g | Freq: Three times a day (TID) | TRANSDERMAL | Status: DC | PRN
Start: 2016-06-22 — End: 2016-06-22
  Filled 2016-06-22: qty 100

## 2016-06-22 MED ORDER — PNEUMOCOCCAL VAC POLYVALENT 25 MCG/0.5ML IJ INJ
0.5000 mL | INJECTION | INTRAMUSCULAR | Status: AC
  Administered 2016-06-23: 0.5 mL via INTRAMUSCULAR
  Filled 2016-06-22: qty 0.5

## 2016-06-22 MED ORDER — TRAMADOL HCL 50 MG PO TABS
50.0000 mg | ORAL_TABLET | Freq: Three times a day (TID) | ORAL | Status: DC | PRN
  Administered 2016-06-23 – 2016-06-25 (×5): 50 mg via ORAL
  Filled 2016-06-22 (×5): qty 1

## 2016-06-22 MED ORDER — SULFAMETHOXAZOLE-TRIMETHOPRIM 800-160 MG PO TABS: 1.0000 | ORAL_TABLET | Freq: Two times a day (BID) | ORAL | Status: DC

## 2016-06-22 MED ORDER — TRAZODONE HCL 50 MG PO TABS: 50.0000 mg | ORAL_TABLET | Freq: Every day | ORAL | Status: DC

## 2016-06-22 MED ORDER — CELECOXIB 100 MG PO CAPS
100.0000 mg | ORAL_CAPSULE | Freq: Two times a day (BID) | ORAL | Status: DC | PRN
  Administered 2016-06-23 – 2016-06-28 (×8): 100 mg via ORAL
  Filled 2016-06-22 (×12): qty 1

## 2016-06-22 MED ORDER — MAGNESIUM HYDROXIDE 400 MG/5ML PO SUSP: 30.0000 mL | Freq: Every day | ORAL | Status: DC | PRN

## 2016-06-22 MED ORDER — LORAZEPAM 2 MG PO TABS: 2.0000 mg | ORAL_TABLET | Freq: Three times a day (TID) | ORAL | Status: DC | PRN

## 2016-06-22 MED ORDER — LEVOTHYROXINE SODIUM 100 MCG PO TABS
100.0000 ug | ORAL_TABLET | Freq: Every day | ORAL | Status: DC
  Administered 2016-06-23 – 2016-06-29 (×7): 100 ug via ORAL
  Filled 2016-06-22 (×7): qty 1
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
## 2016-06-22 NOTE — BH Assessment (Signed)
[place] Assessment Progress Note  Per Corena Pilgrim, MD, this pt requires psychiatric hospitalization at this time.  The following facilities have been contacted to seek placement for this pt, with results as noted:  Beds available, information sent, decision pending:  [facility]   At capacity:  [facility], [place] Triage Specialist [phone]
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
## 2016-06-22 NOTE — ED Notes (Signed)
Report received from [place]. Patient alert and oriented, warm and dry, in no acute distress. Patient denies SI, HI, AVH and pain. Patient made aware of Q15 minute rounds and security cameras for their safety. Patient instructed to come to me with needs or concerns.
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
## 2016-06-22 NOTE — Progress Notes (Signed)
CSW spoke with Kerry Dory of [facility] who confirms that the patient has been accepted and is welcomed to come to their facility after 8:30pm. CSW made patient aware. Patient signed voluntary paperwork, and CSW faxed it to [facility] and placed hard copy in the chart.  Willette Brace [id number] ED CSW 06/22/2016 5:51 PM

## 2016-06-22 NOTE — BH Assessment (Signed)
Patient has been accepted to [facility].  Accepting physician is Dr. Jerilee Hoh.  Attending Physician will be Dr. Jerilee Hoh.  Patient has been assigned to room 301, by [place] Charge Nurse Jake Church.  Call report to [phone].  Representative/Transfer Coordinator is Barbie Haggis ER Staff (Brittiney, Education officer, museum) made aware of acceptance.  Patient bed will be available after 8:30pm. Patient can arrive anytime after 8:30pm

## 2016-06-23 DIAGNOSIS — F322 Major depressive disorder, single episode, severe without psychotic features: Secondary | ICD-10-CM

## 2016-06-23 NOTE — Progress Notes (Signed)
Pt has been pleasant and cooperative. Pt denies SI and A/V hallucinations. Pt has been attending some unit activities. Pt has been med compliant. Pt needs some redirecting. At times.

## 2016-06-23 NOTE — BHH Group Notes (Signed)
[place] LCSW Group Therapy  06/23/2016 3:21 PM  Type of Therapy:  Group Therapy  Participation Level:  Minimal  Participation Quality:  Inattentive  Affect:  Labile  Cognitive:  Alert  Insight:  Limited  Engagement in Therapy:  Limited  Modes of Intervention:  Discussion, Education, Socialization and Support  Summary of Progress/Problems: Feelings around Relapse. Group members discussed the meaning of relapse and shared personal stories of relapse, how it affected them and others, and how they perceived themselves during this time. Group members were encouraged to identify triggers, warning signs and coping skills used when facing the possibility of relapse. Social supports were discussed and explored in detail. Pt attended group and stayed the entire time. She sat quietly and listened to other group members share. In the beginning of group she reported she felt wonderful and happy to be in group. At the end of group, pt started to cry very briefly. CSW asked if she was ok, pt replied "orthopedic pain" but appeared to euphoric again.   [place] MSW, [place]  06/23/2016, 3:21 PM
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
## 2016-06-23 NOTE — Plan of Care (Signed)
Problem: Safety: Goal: Periods of time without injury will increase Outcome: Progressing Pt remains free from harm.  Problem: Self-Concept: Goal: Level of anxiety will decrease Outcome: Not Progressing Pt rates anxiety 10/10 at this time.
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
## 2016-06-24 MED ORDER — LORAZEPAM 2 MG/ML IJ SOLN
1.0000 mg | Freq: Four times a day (QID) | INTRAMUSCULAR | Status: DC | PRN
Start: 2016-06-24 — End: 2016-06-25

## 2016-06-24 MED ORDER — LORAZEPAM 1 MG PO TABS
1.0000 mg | ORAL_TABLET | ORAL | Status: DC | PRN
  Administered 2016-06-25: 1 mg via ORAL
  Filled 2016-06-24: qty 1

## 2016-06-24 MED ORDER — TRAZODONE HCL 100 MG PO TABS
100.0000 mg | ORAL_TABLET | Freq: Every day | ORAL | Status: DC
  Administered 2016-06-25 (×2): 100 mg via ORAL
  Filled 2016-06-24 (×3): qty 1

## 2016-06-24 MED ORDER — CHLORDIAZEPOXIDE HCL 25 MG PO CAPS
25.0000 mg | ORAL_CAPSULE | Freq: Three times a day (TID) | ORAL | Status: DC
  Administered 2016-06-24 – 2016-06-26 (×9): 25 mg via ORAL
  Filled 2016-06-24 (×10): qty 1
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
## 2016-06-24 NOTE — Progress Notes (Signed)
Pt has been pleasant and cooperative. Pt denies SI and A/V hallucinations. Pt has been attending some unit activities. Pt has been med compliant. Pt needs some redirecting. At times. Some med seeking behaviors.

## 2016-06-24 NOTE — BHH Group Notes (Signed)
[place] Group Notes:  (Nursing/MHT/Case Management/Adjunct)  Date:  06/24/2016  Time:  11:08 PM  Type of Therapy:  Group Therapy  Participation Level:  Active  Participation Quality:  Attentive and Redirectable  Affect:  Defensive  Cognitive:  Alert  Insight:  Lacking  Engagement in Group:  Defensive and Off Topic  Modes of Intervention:  Discussion  Summary of Progress/Problems: Pt became very defensive when asked about goal. Pt stated that she was in pain due to not having her clothes. Staff suggested she contact a family member to see if they could provide her with more clothes. Pt begin to get irritable and switched topics numerous times which made her thoughts hard to follow. Staff was able to redirect.   Jenetta Downer Teofilo Lupinacci 06/24/2016, 11:08 PM

## 2016-06-24 NOTE — Progress Notes (Signed)
D: Pt affect is labile this evening. She approaches writer at beginning of the shift stating "I'm just excited about everything right now. I'm grateful to be here." During medication pass, pt becomes tearful due to chronic pain rated 10/10. Pt rates anxiety 10/10. Denies SI/HI/AVH at this time. A: Emotional support and encouragement provided. Writer discusses with pt coping skills for anxiety. Medications administered with education. Pt requires some redirection but is pleasant. q15 minute safety checks maintained. R: Pt remains free from harm. Will continue to monitor.

## 2016-06-24 NOTE — BHH Group Notes (Signed)
[place] LCSW Group Therapy  06/24/2016 2:20 PM  Type of Therapy:  Group Therapy  Participation Level:  Minimal  Participation Quality:  Attentive  Affect:  Tearful  Cognitive:  Oriented  Insight:  Limited  Engagement in Therapy:  Limited  Modes of Intervention:  Discussion, Education and Support  Summary of Progress/Problems: Patients defined and discussed healthy coping skills. Patients identified healthy coping skills they would like to try during hospitalization and after discharge. CSW offered insight to varying coping skills that may have been new to patients such as practicing mindfulness. Pt minimally participated in group and became tearful when discussing working through grief she was experiencing. CSW provided support to pt and offered coping skills that pt could try to help with processing her feelings.   Charlsey Moragne G. [place], [place] 06/24/2016, 2:20 PM

## 2016-06-25 ENCOUNTER — Telehealth: Payer: Self-pay | Admitting: Internal Medicine

## 2016-06-25 DIAGNOSIS — F1020 Alcohol dependence, uncomplicated: Secondary | ICD-10-CM

## 2016-06-25 DIAGNOSIS — F10239 Alcohol dependence with withdrawal, unspecified: Secondary | ICD-10-CM

## 2016-06-25 DIAGNOSIS — F10939 Alcohol use, unspecified with withdrawal, unspecified: Secondary | ICD-10-CM

## 2016-06-25 DIAGNOSIS — F1120 Opioid dependence, uncomplicated: Secondary | ICD-10-CM

## 2016-06-25 DIAGNOSIS — F3110 Bipolar disorder, current episode manic without psychotic features, unspecified: Secondary | ICD-10-CM

## 2016-06-25 DIAGNOSIS — F502 Bulimia nervosa: Secondary | ICD-10-CM

## 2016-06-25 DIAGNOSIS — F3160 Bipolar disorder, current episode mixed, unspecified: Secondary | ICD-10-CM

## 2016-06-25 LAB — LIPID PANEL
CHOLESTEROL: 173 mg/dL (ref 0–200)
HDL: 71 mg/dL (ref >40)
LDL Cholesterol: 81 mg/dL (ref 0–99)
TRIGLYCERIDES: 107 mg/dL (ref <150)
Total CHOL/HDL Ratio: 2.4 RATIO
VLDL: 21 mg/dL (ref 0–40)

## 2016-06-25 LAB — VITAMIN B12: VITAMIN B 12: 423 pg/mL (ref 180–914)

## 2016-06-25 LAB — HEMOGLOBIN A1C: Hgb A1c MFr Bld: 5.2 % (ref 4.0–6.0)

## 2016-06-25 MED ORDER — ARIPIPRAZOLE 10 MG PO TABS
10.0000 mg | ORAL_TABLET | Freq: Every day | ORAL | Status: DC
  Administered 2016-06-25 – 2016-06-26 (×2): 10 mg via ORAL
  Filled 2016-06-25 (×2): qty 1

## 2016-06-25 MED ORDER — FLUOXETINE HCL 10 MG PO CAPS: 10.0000 mg | ORAL_CAPSULE | Freq: Every day | ORAL | Status: DC

## 2016-06-25 NOTE — Plan of Care (Signed)
Problem: Education: Goal: Emotional status will improve Outcome: Progressing Pt reports improved mood. She rates depression and anxiety 0/10.  Problem: Education: Goal: Ability to state activities that reduce stress will improve Outcome: Progressing Pt reports to writer that she listens to music to reduce her anxiety and get her mind off of her pain.

## 2016-06-25 NOTE — BHH Group Notes (Signed)
[place] Group Notes:  (Nursing/MHT/Case Management/Adjunct)  Date:  06/25/2016  Time:  11:06 PM  Type of Therapy:  Evening Wrap-up Group  Participation Level:  Active  Participation Quality:  Appropriate and Attentive  Affect: Bright and Appropriate  Cognitive:  Alert and Appropriate  Insight:  Appropriate, Good and Improving  Engagement in Group:  Developing/Improving and Engaged  Modes of Intervention:  Activity  Summary of Progress/Problems: Patient very active during outside activity for evening wrap-up group. Myrella Fahs Nanta Leianne Callins 06/25/2016, 11:06 PM

## 2016-06-25 NOTE — BHH Group Notes (Signed)
[place] LCSW Group Therapy   06/25/2016 9:30am Type of Therapy: Group Therapy   Participation Level: Active   Participation Quality: Attentive, Sharing and Supportive   Affect: Depressed and Flat   Cognitive: Alert and Oriented   Insight: Developing/Improving and Engaged   Engagement in Therapy: Developing/Improving and Engaged   Modes of Intervention: Clarification, Confrontation, Discussion, Education, Exploration,  Limit-setting, Orientation, Problem-solving, Rapport Building, Art therapist, Socialization and Support   Summary of Progress/Problems: Pt identified obstacles faced currently and processed barriers involved in overcoming these obstacles. Pt identified steps necessary for overcoming these obstacles and explored motivation (internal and external) for facing these difficulties head on. Pt further identified one area of concern in their lives and chose a goal to focus on for today. Patient identifies "living healthy" as the best way in managing obstacles. She stated that not having access to resources is an obstacle that she faces and causes her symptoms to become unmanageable.  Emilie Rutter, LCSWA

## 2016-06-25 NOTE — Progress Notes (Signed)
D: Patient is pleasant and cooperative. Interacts appropriately with staff and peers. Pt frequents nurses station often with requests and questions. Denies SI, HI, AVH.  A: Encouragement and support offered. Q 15 min safety checks maintained. Medications administered as prescribed. R: Pt receptive, med and group compliant. Remains safe on unit.

## 2016-06-25 NOTE — Progress Notes (Signed)
D: Pt is pleasant and cooperative this evening. She is seen in the milieu interacting appropriately with staff and peers. Pt rates anxiety and depression 0/10. She verbalizes a desire to go home so she can find a facility to send her husband to. She denies SI/HI/AVH at this time. Pt initially refuses evening dose of trazodone, stating "I want to try and do without it." Pt later requests to receive dose due to inability to fall asleep. A: Emotional support and encouragement provided. Medications administered with education. q15 minute safety checks maintained. R: Pt remains free from harm. Will continue to monitor.

## 2016-06-25 NOTE — Plan of Care (Signed)
Problem: Education: Goal: Emotional status will improve Outcome: Progressing Pt reports feeling better today

## 2016-06-25 NOTE — Telephone Encounter (Signed)
Patient dismissed from [facility] by Lew Dawes MD , effective June 19, 2016. Dismissal letter sent out by certified / registered mail.  DAJ

## 2016-06-25 NOTE — BHH Suicide Risk Assessment (Signed)
[place] INPATIENT:  Family/Significant Other Suicide Prevention Education  Suicide Prevention Education:  Patient Refusal for Family/Significant Other Suicide Prevention Education: The patient Susan Mccarthy has refused to provide written consent for family/significant other to be provided Family/Significant Other Suicide Prevention Education during admission and/or prior to discharge.  Physician notified.  Emilie Rutter, MSW, LCSW-A 06/25/2016, 3:44 PM
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
## 2016-06-26 ENCOUNTER — Ambulatory Visit: Payer: BLUE CROSS/BLUE SHIELD | Admitting: Internal Medicine

## 2016-06-26 DIAGNOSIS — F10230 Alcohol dependence with withdrawal, uncomplicated: Secondary | ICD-10-CM

## 2016-06-26 DIAGNOSIS — F1020 Alcohol dependence, uncomplicated: Secondary | ICD-10-CM

## 2016-06-26 LAB — THYROID PANEL WITH TSH
Free Thyroxine Index: 1.3 (ref 1.2–4.9)
T3 Uptake Ratio: 25 % (ref 24–39)
T4, Total: 5.3 ug/dL (ref 4.5–12.0)
TSH: 65.93 u[IU]/mL — ABNORMAL HIGH (ref 0.450–4.500)

## 2016-06-26 LAB — PROLACTIN: PROLACTIN: 32.8 ng/mL — AB (ref 4.8–23.3)

## 2016-06-26 MED ORDER — TRAZODONE HCL 50 MG PO TABS
150.0000 mg | ORAL_TABLET | Freq: Every day | ORAL | Status: DC
  Administered 2016-06-26 – 2016-06-27 (×2): 150 mg via ORAL
  Filled 2016-06-26 (×2): qty 1

## 2016-06-26 MED ORDER — CHLORDIAZEPOXIDE HCL 25 MG PO CAPS
25.0000 mg | ORAL_CAPSULE | Freq: Once | ORAL | Status: AC
  Administered 2016-06-26: 25 mg via ORAL
  Filled 2016-06-26: qty 1

## 2016-06-26 MED ORDER — ARIPIPRAZOLE 5 MG PO TABS
15.0000 mg | ORAL_TABLET | Freq: Every day | ORAL | Status: DC
  Administered 2016-06-27: 15 mg via ORAL
  Filled 2016-06-26: qty 1

## 2016-06-26 NOTE — Plan of Care (Signed)
Problem: Safety: Goal: Ability to remain free from injury will improve Outcome: Progressing Patient has remained free from harm during this shift.
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
## 2016-06-26 NOTE — Progress Notes (Signed)
D: Patient appears hyperverbal and tangential. She denies SI/HI/AVH. States pain at a 10. She has been visible in the milieu interacting with patients and attended wrap up group.  A: Medication given with education. Encouragement provided. PRN Voltaren and PRN Celebrex given for pain.  R: Patient has been compliant with medication. She has remained calm and cooperative. Safety maintained with 15 min checks.
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
## 2016-06-27 MED ORDER — HYDROXYZINE HCL 50 MG PO TABS
50.0000 mg | ORAL_TABLET | Freq: Once | ORAL | Status: AC
  Administered 2016-06-27: 50 mg via ORAL
  Filled 2016-06-27: qty 1

## 2016-06-27 MED ORDER — HYDROXYZINE HCL 50 MG PO TABS
50.0000 mg | ORAL_TABLET | Freq: Every evening | ORAL | Status: DC | PRN
  Administered 2016-06-27: 50 mg via ORAL
  Filled 2016-06-27 (×2): qty 1

## 2016-06-27 MED ORDER — ARIPIPRAZOLE 10 MG PO TABS
20.0000 mg | ORAL_TABLET | Freq: Every day | ORAL | Status: DC
  Administered 2016-06-28 – 2016-06-29 (×2): 20 mg via ORAL
  Filled 2016-06-27 (×2): qty 2

## 2016-06-27 MED ORDER — LORATADINE 10 MG PO TABS
10.0000 mg | ORAL_TABLET | Freq: Every day | ORAL | Status: DC
  Administered 2016-06-28 – 2016-06-29 (×2): 10 mg via ORAL
  Filled 2016-06-27 (×2): qty 1

## 2016-06-27 NOTE — Plan of Care (Signed)
Problem: Safety: Goal: Periods of time without injury will increase Outcome: Progressing Patient has remained free from injury this shift.  Patient remains compliant with falls precautions set in place.  Will continue to monitor.

## 2016-06-27 NOTE — Progress Notes (Signed)
4:16 AM      [] Hide copied text [] Hover for attribution information D: Patient appeared positive for majority of the day. She repeatedly stated how happy she was to be here and that she was glad to be receiving help. She denies SI/HI/AVH. Rates her pain at a 10. A: Medication given with education. Encouragement provided. PRN Celebrex given for complaints of body pain. R: Patient has been compliant with medication. She has remained calm and cooperative. Safety maintained with 15 min checks.
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
## 2016-06-27 NOTE — Tx Team (Signed)
Interdisciplinary Treatment Plan Update (Adult)         Date: 06/27/2016   Time Reviewed: 10:30 AM   Progress in Treatment: Improving Attending groups: Yes  Participating in groups: Yes  Taking medication as prescribed: Yes  Tolerating medication: Yes  Family/Significant other contact made: Pt refused family contact Patient understands diagnosis: Yes  Discussing patient identified problems/goals with staff: Yes  Medical problems stabilized or resolved: Yes  Denies suicidal/homicidal ideation: Yes  Issues/concerns per patient self-inventory: Yes  Other:   New problem(s) identified: N/A   Discharge Plan or Barriers: see below   Reason for Continuation of Hospitalization:   Depression   Anxiety   Medication Stabilization   Comments: N/A   Estimated length of stay: 2 additional days     Patient is a 65 year old female admitted for major depressive disorder, severe ad suicidal ideation. Patient lives in [place], [place]. Patient will benefit from crisis stabilization, medication evaluation, group therapy, and psycho education in addition to case management for discharge planning. Patient and CSW reviewed pt's identified goals and treatment plan. Pt verbalized understanding and agreed to treatment plan.    Review of initial/current patient goals per problem list:  1. Goal(s): Patient will participate in aftercare plan   Met: Yes  Target date: 3-5 days post admission date   As evidenced by: Patient will participate within aftercare plan AEB aftercare provider and housing plan at discharge being identified.   Patient will follow-up with [facility]  2. Goal (s): Patient will exhibit decreased depressive symptoms and suicidal ideations.   Met: Goal Progressing   Target date: 3-5 days post admission date   As evidenced by: Patient will utilize self-rating of depression at 3 or below and demonstrate decreased signs of depression or be deemed  stable for discharge by MD.   Patient denies suicidal ideation. Patient reports a depression score of 5 at this time.  3. Goal(s): Patient will demonstrate decreased signs and symptoms of anxiety.   Met: Goal progressing  Target date: 3-5 days post admission date   As evidenced by: Patient will utilize self-rating of anxiety at 3 or below and demonstrated decreased signs of anxiety, or be deemed stable for discharge by MD   Patient reports an anxiety of 4 at this time.   4. Goal(s): Patient will demonstrate decreased signs of withdrawal due to substance abuse   Met: Yes  Target date: 3-5 days post admission date   As evidenced by: Patient will produce a CIWA/COWS score of 0, have stable vitals signs, and no symptoms of withdrawal   Patient produced a CIWA/COWS score of 0, has stable vitals signs, and no symptoms of withdrawal      Attendees:  Patient:  Family:  Physician: Dr. Merlyn Albert , MD     06/27/2016 10:30AM  Nursing: Polly Cobia, RN       06/27/2016 10:30AM  Clinical Social Worker: Emilie Rutter, [place]  06/27/2016 10:30AM

## 2016-06-27 NOTE — BHH Group Notes (Signed)
[place] Group Notes:  (Nursing/MHT/Case Management/Adjunct)  Date:  06/27/2016  Time:  6:50 AM  Type of Therapy:  Group Therapy  Participation Level:  Active  Participation Quality:  Appropriate  Affect:  Appropriate  Cognitive:  Alert and Appropriate  Insight:  Appropriate  Engagement in Group:  Engaged  Modes of Intervention:  Activity  Summary of Progress/Problems:  Susan Mccarthy 06/27/2016, 6:50 AM

## 2016-06-27 NOTE — BHH Group Notes (Signed)
[facility] LCSW Group Therapy   06/27/2016  9:30 am   Type of Therapy: Group Therapy   Participation Level: Active   Participation Quality: Attentive, Sharing and Supportive   Affect: Appropriate  Cognitive: Alert and Oriented   Insight: Developing/Improving and Engaged   Engagement in Therapy: Developing/Improving and Engaged   Modes of Intervention: Clarification, Confrontation, Discussion, Education, Exploration, Limit-setting, Orientation, Problem-solving, Rapport Building, Art therapist, Socialization and Support   Summary of Progress/Problems: The topic for group today was emotional regulation. This group focused on both positive and negative emotion identification and allowed  group members to process ways to identify feelings, regulate negative emotions, and find healthy ways to manage internal/external emotions. Group members were asked to reflect on a time when their reaction to an emotion led to a negative outcome and explored how alternative responses using emotion regulation would have benefited them. Group members were also asked to discuss a time when emotion regulation was utilized when a negative emotion was experienced. Pt reported two healthy coping mechanism the pt uses to cope in a crisis.  Pt reported listening to music that the pt can relate to at the time of the pt's crisis and choreographing this music into dance in her head.  The pt reported that using the pt's primary coping mechanism assists the pt in feeling "at one with others", despite the pt's problems.  Pt reported that the pt's primary goal is to feel more in control and more powerful in regards to regulating her emotions.  Pt was polite and cooperative with the CSW and other group members and focused and attentive to the topics discussed and the sharing of others. Pt reports she also enjoys swimming as a means of regulating her emotions.       Alphonse Guild. Shontel Santee, MSW, LCSWA, LCAS

## 2016-06-27 NOTE — Plan of Care (Signed)
Problem: Coping: Goal: Ability to verbalize frustrations and anger appropriately will improve Outcome: Not Met (add Reason) Patient remains anxious and irritable. Easily agitated. Demanding and needy. Vistaril 50 mg po given PRN for agitation. tol po bedtime medications well. S/e and adverse reactions given. q 15 min checks maintained for safety. Will continue to monitor behavior.

## 2016-06-27 NOTE — Progress Notes (Signed)
D: Patient appears very positive this evening. Tearfully stated she's glad she's finally getting help and that she's thankful to everybody on the unit. She denies SI/HI/AVH. Rates her pain at a 10. C/o stress.  A: Medication given with education. Encouragement provided. PRN celbrex, tylenol, and visteril given.  R: Patient has been compliant with medication. She has remained calm and cooperative. Safety maintained with 15 min checks.

## 2016-06-28 MED ORDER — CELECOXIB 100 MG PO CAPS: 100.0000 mg | ORAL_CAPSULE | Freq: Two times a day (BID) | ORAL | 0 refills | Status: DC | PRN

## 2016-06-28 MED ORDER — CLONAZEPAM 0.5 MG PO TABS
0.5000 mg | ORAL_TABLET | Freq: Three times a day (TID) | ORAL | Status: DC | PRN
  Administered 2016-06-28: 0.5 mg via ORAL
  Filled 2016-06-28: qty 1

## 2016-06-28 MED ORDER — ONDANSETRON HCL 4 MG PO TABS: 4.0000 mg | ORAL_TABLET | Freq: Three times a day (TID) | ORAL | Status: DC | PRN

## 2016-06-28 MED ORDER — MIRTAZAPINE 15 MG PO TABS
15.0000 mg | ORAL_TABLET | Freq: Every day | ORAL | Status: DC
  Administered 2016-06-28: 15 mg via ORAL
  Filled 2016-06-28: qty 1

## 2016-06-28 MED ORDER — ARIPIPRAZOLE 20 MG PO TABS: 20.0000 mg | ORAL_TABLET | Freq: Every day | ORAL | 0 refills | Status: DC

## 2016-06-28 MED ORDER — HYDROXYZINE HCL 50 MG PO TABS
50.0000 mg | ORAL_TABLET | Freq: Once | ORAL | Status: AC
  Administered 2016-06-28: 50 mg via ORAL
  Filled 2016-06-28: qty 1

## 2016-06-28 MED ORDER — HYDROXYZINE HCL 50 MG PO TABS: 50.0000 mg | ORAL_TABLET | Freq: Three times a day (TID) | ORAL | Status: DC | PRN

## 2016-06-28 MED ORDER — MIRTAZAPINE 15 MG PO TABS: 15.0000 mg | ORAL_TABLET | Freq: Every day | ORAL | 0 refills | Status: DC

## 2016-06-28 MED ORDER — DICLOFENAC SODIUM 1 % TD GEL: 4.0000 g | Freq: Three times a day (TID) | TRANSDERMAL | 0 refills | Status: DC | PRN

## 2016-06-28 MED ORDER — LEVOTHYROXINE SODIUM 100 MCG PO TABS: 100.0000 ug | ORAL_TABLET | Freq: Every day | ORAL | 0 refills | Status: DC

## 2016-06-28 NOTE — Plan of Care (Signed)
Problem: Activity: Goal: Interest or engagement in activities will improve Outcome: Progressing Patient attenting  Unit activities  And verbalize feeling.
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
## 2016-06-28 NOTE — Progress Notes (Signed)
Called by [facility], states patient in room throwing up. When approached, pt states she's dry heaving. Pt found gagging and coughing up scanty amount of white clear phlegm. Pt states she coughed up pain medication. No vomit noted.  Ginger ale and crackers offered and accepted. Pt lying in bed with eyes open. No s/s of acute distress noted. Will continue to monitor behavior.

## 2016-06-28 NOTE — Progress Notes (Signed)
Pt remains irritable and demanding. Pt said "call my doctor I need something for my stress". Dr. Weber Cooks notified. N.O. Vistaril 50 mg po once. Will continue to monitor.
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
## 2016-06-28 NOTE — [facility] Group Notes (Signed)
Goals Group Date/Time: 06/28/2016 9:00 AM Type of Therapy and Topic: Group Therapy: Goals Group: SMART Goals   Participation Level: Moderate  Description of Group:    The purpose of a daily goals group is to assist and guide patients in setting recovery/wellness-related goals. The objective is to set goals as they relate to the crisis in which they were admitted. Patients will be using SMART goal modalities to set measurable goals. Characteristics of realistic goals will be discussed and patients will be assisted in setting and processing how one will reach their goal. Facilitator will also assist patients in applying interventions and coping skills learned in psycho-education groups to the SMART goal and process how one will achieve defined goal.   Therapeutic Goals:   -Patients will develop and document one goal related to or their crisis in which brought them into treatment.  -Patients will be guided by LCSW using SMART goal setting modality in how to set a measurable, attainable, realistic and time sensitive goal.  -Patients will process barriers in reaching goal.  -Patients will process interventions in how to overcome and successful in reaching goal.   Patient's Goal: Pt reported her goal is to "get help for her husband", but then became tearful and placed her face in her hands and began crying.  Pt reported her husband had "gotten cancer", but could not elaborate.  Pt presented as alternatively cheerful and tearful throughout group, but was always polite and cooperative with the CSW and other group members and focused and attentive to the topics discussed and the sharing of others.    Therapeutic Modalities:  Motivational Interviewing  Art gallery manager  SMART goals setting   Alphonse Guild. Angelissa Supan, LCSWA, LCAS
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
## 2016-06-28 NOTE — [facility] Group Notes (Signed)
[facility] Group Notes:  (Nursing/MHT/Case Management/Adjunct)  Date:  06/28/2016  Time:  2:25 AM  Type of Therapy:  Group Therapy  Participation Level:  Active  Participation Quality:  Appropriate  Affect:  Flat and Irritable  Cognitive:  Alert  Insight:  Limited  Engagement in Group:  Engaged  Modes of Intervention:  Discussion  Summary of Progress/Problems:When staff asked pt about her goal. Pt stated that all she was concerned about was the pain she was in. She seemed irritable and her responses were short.   Jenetta Downer Othel Hoogendoorn 06/28/2016, 2:25 AM

## 2016-06-28 NOTE — Progress Notes (Signed)
  [facility] Adult Case Management Discharge Plan :  Will you be returning to the same living situation after discharge:  Yes,  home At discharge, do you have transportation home?: Yes,  [place] transportation Do you have the ability to pay for your medications: Yes,  insurance coverage  Release of information consent forms completed and in the chart;  Patient's signature needed at discharge.  Patient to Follow up at: Follow-up Information    [facility]. Go on 07/02/2016.   Why:  Please arrive to [facility] to see your therapist, Charolotte Eke at 1 o'clock PM . Please arrive 15 minutes early for prompt services and to complete needed paperwork.  Contact information: [address], [place],  [address] Phone: [phone]          Next level of care provider has access to [facility] and Suicide Prevention discussed: Yes,  SPE reviewed with patient  Have you used any form of tobacco in the last 30 days? (Cigarettes, Smokeless Tobacco, Cigars, and/or Pipes): Yes  Has patient been referred to the Quitline?: N/A patient is not a smoker  Patient has been referred for addiction treatment: Pt. refused referral  Emilie Rutter, MSW, LCSW-A 06/29/16  9:18AM

## 2016-06-28 NOTE — Progress Notes (Signed)
Patient c/o leg pain. Celebrex 100 mg po given PRN for pain. tol po med well. No s/s of distress noted.
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
## 2016-06-29 ENCOUNTER — Telehealth: Payer: Self-pay | Admitting: *Deleted

## 2016-06-29 MED ORDER — PROMETHAZINE HCL 12.5 MG PO TABS: 12.5000 mg | ORAL_TABLET | Freq: Four times a day (QID) | ORAL | 0 refills | Status: DC | PRN

## 2016-06-29 MED ORDER — ARIPIPRAZOLE 15 MG PO TABS: 15.0000 mg | ORAL_TABLET | Freq: Every day | ORAL | 0 refills | Status: DC

## 2016-06-29 MED ORDER — ARIPIPRAZOLE 5 MG PO TABS: 15.0000 mg | ORAL_TABLET | Freq: Every day | ORAL | Status: DC

## 2016-06-29 MED ORDER — RANITIDINE HCL 150 MG PO TABS: 150.0000 mg | ORAL_TABLET | Freq: Every day | ORAL | 0 refills | Status: DC

## 2016-06-29 NOTE — Progress Notes (Signed)
Patient discharged home. DC instructions provided and explained. Medications reviewed. Rx given, all questions answered. Pt stable at discharge. Denies SI, HI, AVH. Belongings returned, pt left via [place].

## 2016-06-29 NOTE — Plan of Care (Signed)
Problem: Education: Goal: Ability to state activities that reduce stress will improve Outcome: Progressing Pt identifies listening to music as a way to reduce stress.  Problem: Self-Concept: Goal: Level of anxiety will decrease Outcome: Progressing Pt rates anxiety 0/10.

## 2016-06-29 NOTE — Progress Notes (Signed)
Pt observed in room vomiting. Pt given ginger ale and crackers upon request, stating that they have helped her previously.

## 2016-06-29 NOTE — Progress Notes (Signed)
D: Pt is pleasant and cooperative this evening. She is observed in the milieu interacting appropriately with staff and peers. Pt continues to c/o chronic pain and requests PRN medication regularly. Pt rates anxiety and depression 0/10. Denies SI/HI/AVH at this time. Pt reports to Probation officer that she enjoys listening and dancing to music as a way of coping with stress. A: Emotional support and encouragement provided. Medications administered with education. q15 minute safety checks maintained. R: Pt remains free from harm. Will continue to monitor.

## 2016-06-29 NOTE — Tx Team (Signed)
Interdisciplinary Treatment Plan Update (Adult)         Date: 06/29/2016   Time Reviewed: 10:30 AM   Progress in Treatment: Improving Attending groups: Yes  Participating in groups: Yes  Taking medication as prescribed: Yes  Tolerating medication: Yes  Family/Significant other contact made: Pt refused family contact Patient understands diagnosis: Yes  Discussing patient identified problems/goals with staff: Yes  Medical problems stabilized or resolved: Yes  Denies suicidal/homicidal ideation: Yes  Issues/concerns per patient self-inventory: Yes  Other:   New problem(s) identified: N/A   Discharge Plan or Barriers: see below   Reason for Continuation of Hospitalization:   Depression   Anxiety   Medication Stabilization   Comments: N/A   Discharge date: 06/29/16    Patient is a 65 year old female admitted for major depressive disorder, severe ad suicidal ideation. Patient lives in [place], [place]. Patient will benefit from crisis stabilization, medication evaluation, group therapy, and psycho education in addition to case management for discharge planning. Patient and CSW reviewed pt's identified goals and treatment plan. Pt verbalized understanding and agreed to treatment plan.    Review of initial/current patient goals per problem list:  1. Goal(s): Patient will participate in aftercare plan   Met: Yes  Target date: 3-5 days post admission date   As evidenced by: Patient will participate within aftercare plan AEB aftercare provider and housing plan at discharge being identified.   Patient will follow-up with [facility]  2. Goal (s): Patient will exhibit decreased depressive symptoms and suicidal ideations.   Met: Yes  Target date: 3-5 days post admission date   As evidenced by: Patient will utilize self-rating of depression at 3 or below and demonstrate decreased signs of depression or be deemed stable for discharge by MD.    Patient denies suicidal ideation. Patient reports a depression score of 2 at this time. Adequate for discharge per MD.   3. Goal(s): Patient will demonstrate decreased signs and symptoms of anxiety.   Met: Yes  Target date: 3-5 days post admission date   As evidenced by: Patient will utilize self-rating of anxiety at 3 or below and demonstrated decreased signs of anxiety, or be deemed stable for discharge by MD   Patient reports an anxiety of 2 at this time. Adequate for discharge per MD.        Attendees:  Patient:  Family:  Physician: Dr. Merlyn Albert , MD     06/29/2016 10:30AM  Nursing: Polly Cobia, RN       06/29/2016 10:30AM  Clinical Social Worker: Emilie Rutter, [place]  06/29/2016 10:30AM

## 2016-06-29 NOTE — Telephone Encounter (Signed)
Pt was on TCM list she was admitted for Mania. {t d/c 7/28, and it stated pt will be f/u w/behavorial health psychiatrist beth McKenzie 7/31...Johny Chess

## 2016-07-02 ENCOUNTER — Ambulatory Visit (HOSPITAL_COMMUNITY): Payer: Self-pay | Admitting: Licensed Clinical Social Worker

## 2016-07-03 NOTE — Telephone Encounter (Signed)
Received signed domestic return receipt verifying delivery of certified letter on June 29, 2016. Article number [id number] [place] [address]

## 2016-07-03 NOTE — Telephone Encounter (Signed)
Noted. Thx.

## 2016-08-05 ENCOUNTER — Encounter (HOSPITAL_COMMUNITY): Payer: Self-pay | Admitting: Emergency Medicine

## 2016-08-05 ENCOUNTER — Ambulatory Visit (HOSPITAL_COMMUNITY)
Admission: RE | Admit: 2016-08-05 | Discharge: 2016-08-05 | Disposition: A | Discharge status: Home / Self Care | Payer: BLUE CROSS/BLUE SHIELD | Source: Home / Self Care | Attending: Psychiatry | Admitting: Psychiatry

## 2016-08-05 ENCOUNTER — Emergency Department (HOSPITAL_COMMUNITY)
Admission: EM | Admit: 2016-08-05 | Discharge: 2016-08-06 | Disposition: A | Discharge status: Home / Self Care | Payer: BLUE CROSS/BLUE SHIELD | Attending: Emergency Medicine | Admitting: Emergency Medicine

## 2016-08-05 DIAGNOSIS — Z79899 Other long term (current) drug therapy: Secondary | ICD-10-CM | POA: Diagnosis not present

## 2016-08-05 DIAGNOSIS — E039 Hypothyroidism, unspecified: Secondary | ICD-10-CM | POA: Diagnosis not present

## 2016-08-05 DIAGNOSIS — F1020 Alcohol dependence, uncomplicated: Secondary | ICD-10-CM | POA: Insufficient documentation

## 2016-08-05 DIAGNOSIS — Z85828 Personal history of other malignant neoplasm of skin: Secondary | ICD-10-CM

## 2016-08-05 DIAGNOSIS — F411 Generalized anxiety disorder: Secondary | ICD-10-CM | POA: Insufficient documentation

## 2016-08-05 DIAGNOSIS — Z85850 Personal history of malignant neoplasm of thyroid: Secondary | ICD-10-CM

## 2016-08-05 DIAGNOSIS — F332 Major depressive disorder, recurrent severe without psychotic features: Secondary | ICD-10-CM | POA: Insufficient documentation

## 2016-08-05 DIAGNOSIS — F410 Panic disorder [episodic paroxysmal anxiety] without agoraphobia: Secondary | ICD-10-CM | POA: Diagnosis not present

## 2016-08-05 DIAGNOSIS — F1010 Alcohol abuse, uncomplicated: Secondary | ICD-10-CM | POA: Diagnosis not present

## 2016-08-05 DIAGNOSIS — F419 Anxiety disorder, unspecified: Secondary | ICD-10-CM

## 2016-08-05 DIAGNOSIS — Z9221 Personal history of antineoplastic chemotherapy: Secondary | ICD-10-CM | POA: Insufficient documentation

## 2016-08-05 LAB — RAPID URINE DRUG SCREEN, HOSP PERFORMED
Amphetamines: NOT DETECTED
BENZODIAZEPINES: NOT DETECTED
Barbiturates: NOT DETECTED
COCAINE: NOT DETECTED
Opiates: NOT DETECTED
Tetrahydrocannabinol: NOT DETECTED

## 2016-08-05 LAB — COMPREHENSIVE METABOLIC PANEL
ALT: 35 U/L (ref 14–54)
AST: 44 U/L — ABNORMAL HIGH (ref 15–41)
Albumin: 4.3 g/dL (ref 3.5–5.0)
Alkaline Phosphatase: 128 U/L — ABNORMAL HIGH (ref 38–126)
Anion gap: 10 (ref 5–15)
BUN: 13 mg/dL (ref 6–20)
CALCIUM: 9.1 mg/dL (ref 8.9–10.3)
CHLORIDE: 93 mmol/L — AB (ref 101–111)
CO2: 23 mmol/L (ref 22–32)
CREATININE: 0.87 mg/dL (ref 0.44–1.00)
Glucose, Bld: 100 mg/dL — ABNORMAL HIGH (ref 65–99)
Potassium: 3.8 mmol/L (ref 3.5–5.1)
Sodium: 126 mmol/L — ABNORMAL LOW (ref 135–145)
Total Bilirubin: 0.3 mg/dL (ref 0.3–1.2)
Total Protein: 7.8 g/dL (ref 6.5–8.1)

## 2016-08-05 LAB — ETHANOL

## 2016-08-05 LAB — CBC
HEMATOCRIT: 40.8 % (ref 36.0–46.0)
Hemoglobin: 13.9 g/dL (ref 12.0–15.0)
MCH: 29.7 pg (ref 26.0–34.0)
MCHC: 34.1 g/dL (ref 30.0–36.0)
MCV: 87.2 fL (ref 78.0–100.0)
PLATELETS: 240 10*3/uL (ref 150–400)
RBC: 4.68 MIL/uL (ref 3.87–5.11)
RDW: 14.7 % (ref 11.5–15.5)
WBC: 7.4 10*3/uL (ref 4.0–10.5)

## 2016-08-05 MED ORDER — LORAZEPAM 1 MG PO TABS: 0.0000 mg | ORAL_TABLET | Freq: Two times a day (BID) | ORAL | Status: DC

## 2016-08-05 MED ORDER — LORAZEPAM 1 MG PO TABS
0.0000 mg | ORAL_TABLET | Freq: Four times a day (QID) | ORAL | Status: DC
  Administered 2016-08-05 – 2016-08-06 (×3): 1 mg via ORAL
  Filled 2016-08-05 (×3): qty 1

## 2016-08-05 MED ORDER — LORATADINE 10 MG PO TABS
10.0000 mg | ORAL_TABLET | Freq: Once | ORAL | Status: AC
  Administered 2016-08-05: 10 mg via ORAL
  Filled 2016-08-05 (×2): qty 1
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
## 2016-08-05 NOTE — ED Notes (Signed)
Bed: WBH40 Expected date:  Expected time:  Means of arrival:  Comments: Triage 4 

## 2016-08-05 NOTE — ED Notes (Signed)
Patient dressed out and wanded by secuirty. Belongings placed at nurses station.
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
## 2016-08-05 NOTE — Progress Notes (Signed)
Disposition CSW completed patient referrals to the following inpatient psych facilities:  [facility]  CSW will continue to follow patient for placement needs.  [facility] Disposition CSW [phone]

## 2016-08-06 DIAGNOSIS — F410 Panic disorder [episodic paroxysmal anxiety] without agoraphobia: Secondary | ICD-10-CM | POA: Diagnosis not present

## 2016-08-06 DIAGNOSIS — F411 Generalized anxiety disorder: Secondary | ICD-10-CM | POA: Diagnosis present

## 2016-08-06 MED ORDER — CITALOPRAM HYDROBROMIDE 10 MG PO TABS: 10.0000 mg | ORAL_TABLET | Freq: Every day | ORAL | 0 refills | Status: DC

## 2016-08-06 MED ORDER — ONDANSETRON HCL 4 MG PO TABS: 4.0000 mg | ORAL_TABLET | Freq: Three times a day (TID) | ORAL | Status: DC | PRN

## 2016-08-06 MED ORDER — IBUPROFEN 200 MG PO TABS
600.0000 mg | ORAL_TABLET | Freq: Three times a day (TID) | ORAL | Status: DC | PRN
  Administered 2016-08-06: 600 mg via ORAL
  Filled 2016-08-06: qty 3

## 2016-08-06 MED ORDER — ACETAMINOPHEN 325 MG PO TABS: 650.0000 mg | ORAL_TABLET | ORAL | Status: DC | PRN

## 2016-08-06 MED ORDER — CITALOPRAM HYDROBROMIDE 10 MG PO TABS
10.0000 mg | ORAL_TABLET | Freq: Every day | ORAL | Status: DC
  Administered 2016-08-06: 10 mg via ORAL
  Filled 2016-08-06: qty 1

## 2016-08-06 MED ORDER — ALUM & MAG HYDROXIDE-SIMETH 200-200-20 MG/5ML PO SUSP: 30.0000 mL | ORAL | Status: DC | PRN

## 2016-08-06 MED ORDER — GABAPENTIN 300 MG PO CAPS: 300.0000 mg | ORAL_CAPSULE | Freq: Three times a day (TID) | ORAL | 0 refills | Status: DC

## 2016-08-06 MED ORDER — GABAPENTIN 300 MG PO CAPS
300.0000 mg | ORAL_CAPSULE | Freq: Three times a day (TID) | ORAL | Status: DC
  Administered 2016-08-06 (×2): 300 mg via ORAL
  Filled 2016-08-06 (×2): qty 1

## 2016-08-06 NOTE — Discharge Instructions (Signed)
For your ongoing behavioral health needs, you are advised to follow up with an outpatient psychiatrist.  Contact one of the following providers at your earliest opportunity to schedule and intake appointment:       [facility] [place], [place] [address]      [phone]       [facility]      [address], [place] [address]      [place], [place] [address]      [phone]       [place]      [place] [address] [place], [place] [address]      [phone]
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
## 2016-08-06 NOTE — ED Notes (Signed)
Pt discharged home. Discharge summary reviewed with pt. RX provided. Pt verbalizes understanding of discharge summary and outpatient resources provided.Pt denies SI/HI. Denies AVH. Pt signed for personal property and property returned. Pt signed e-signature. Bus pass provided per pt request. Pt ambulatory off unit with [facility].

## 2016-08-06 NOTE — Progress Notes (Signed)
ED Cm provided pt a list of [facility] pcps and psychiatrists
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
## 2016-08-06 NOTE — BH Assessment (Signed)
[place] Assessment Progress Note  Per Corena Pilgrim, MD, this pt does not require psychiatric hospitalization at this time.  Pt is to be discharged from [facility] with recommendation to follow up with an outpatient psychiatrist.  Discharge instructions include referral information for the [facility], for [place], and for the [place].  Pt's nurse, [name], has been notified.  Jalene Mullet, [place] Triage Specialist [phone]
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
## 2016-08-06 NOTE — ED Notes (Addendum)
Charted in error.

## 2016-08-06 NOTE — ED Notes (Signed)
Pt compliant with medication regimen. Pt denies SI/HI. Denies AVH. Special checks q 15 mins in place for safety. Video monitoring in place.

## 2016-08-06 NOTE — Progress Notes (Signed)
Pt confirms with ED CM she has no pcp Pt listed with [employer] coverage

## 2016-08-06 NOTE — Progress Notes (Signed)
08/06/16 1354:  Pt was sitting in her room when LRT stopped by to introduce self.  Pt was preparing for discharge and was not interested in activities.  Victorino Sparrow, LRT/CTRS
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
## 2016-08-29 ENCOUNTER — Telehealth: Payer: Self-pay | Admitting: Internal Medicine

## 2016-08-29 NOTE — Telephone Encounter (Signed)
Pt called want to see if Dr.plot can give her some anxiety med due to stressful situation she is facing right now. Pt didn't give me any chance to speak or explain the dismissal letter from Dr. Camila Li and hung up (jus said she is in room 303). Not sure what else we can do since the letter went out 06/19/16.

## 2016-08-29 NOTE — Telephone Encounter (Signed)
Pts spouse called back again. He asked that somebody call his wife. I explained she has been dismissed from the practice. He asked if anyone has explained why to her. I informed him when we did patient wouldn't let us speak and would hang up.

## 2016-08-29 NOTE — Telephone Encounter (Signed)
Patients spouse called back requesting our office to call patient.  Stating patient was interested in scheduling appt.  I tried to contact patient and MB was full.  Patient has been dismissed from the office.
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
## 2016-09-12 ENCOUNTER — Emergency Department (HOSPITAL_COMMUNITY)
Admission: EM | Admit: 2016-09-12 | Discharge: 2016-09-12 | Disposition: A | Discharge status: Home / Self Care | Payer: BLUE CROSS/BLUE SHIELD | Attending: Dermatology | Admitting: Dermatology

## 2016-09-12 ENCOUNTER — Encounter (HOSPITAL_COMMUNITY): Payer: Self-pay | Admitting: *Deleted

## 2016-09-12 DIAGNOSIS — Z5321 Procedure and treatment not carried out due to patient leaving prior to being seen by health care provider: Secondary | ICD-10-CM | POA: Diagnosis not present

## 2016-09-12 DIAGNOSIS — F10120 Alcohol abuse with intoxication, uncomplicated: Secondary | ICD-10-CM | POA: Insufficient documentation
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
## 2016-09-12 NOTE — ED Notes (Signed)
Bed: WTR6 Expected date:  Expected time:  Means of arrival:  Comments: 

## 2016-09-13 ENCOUNTER — Encounter (HOSPITAL_COMMUNITY): Payer: Self-pay | Admitting: *Deleted

## 2016-09-13 ENCOUNTER — Emergency Department (HOSPITAL_COMMUNITY)
Admission: EM | Admit: 2016-09-13 | Discharge: 2016-09-13 | Disposition: A | Discharge status: Home / Self Care | Payer: BLUE CROSS/BLUE SHIELD | Attending: Emergency Medicine | Admitting: Emergency Medicine

## 2016-09-13 DIAGNOSIS — E039 Hypothyroidism, unspecified: Secondary | ICD-10-CM | POA: Insufficient documentation

## 2016-09-13 DIAGNOSIS — Z79899 Other long term (current) drug therapy: Secondary | ICD-10-CM | POA: Insufficient documentation

## 2016-09-13 DIAGNOSIS — F419 Anxiety disorder, unspecified: Secondary | ICD-10-CM | POA: Diagnosis not present

## 2016-09-13 DIAGNOSIS — F17210 Nicotine dependence, cigarettes, uncomplicated: Secondary | ICD-10-CM | POA: Diagnosis not present

## 2016-09-13 DIAGNOSIS — F410 Panic disorder [episodic paroxysmal anxiety] without agoraphobia: Secondary | ICD-10-CM
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
## 2016-09-13 NOTE — ED Notes (Signed)
Bed: WTR5 Expected date:  Expected time:  Means of arrival:  Comments: 
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
## 2016-09-21 ENCOUNTER — Emergency Department (HOSPITAL_COMMUNITY)
Admission: EM | Admit: 2016-09-21 | Discharge: 2016-09-21 | Disposition: A | Discharge status: Home / Self Care | Payer: BLUE CROSS/BLUE SHIELD | Attending: Emergency Medicine | Admitting: Emergency Medicine

## 2016-09-21 ENCOUNTER — Encounter (HOSPITAL_COMMUNITY): Payer: Self-pay | Admitting: Emergency Medicine

## 2016-09-21 DIAGNOSIS — F1010 Alcohol abuse, uncomplicated: Secondary | ICD-10-CM

## 2016-09-21 DIAGNOSIS — Y999 Unspecified external cause status: Secondary | ICD-10-CM | POA: Diagnosis not present

## 2016-09-21 DIAGNOSIS — R791 Abnormal coagulation profile: Secondary | ICD-10-CM | POA: Insufficient documentation

## 2016-09-21 DIAGNOSIS — F17210 Nicotine dependence, cigarettes, uncomplicated: Secondary | ICD-10-CM | POA: Diagnosis not present

## 2016-09-21 DIAGNOSIS — E039 Hypothyroidism, unspecified: Secondary | ICD-10-CM | POA: Diagnosis not present

## 2016-09-21 DIAGNOSIS — Z85850 Personal history of malignant neoplasm of thyroid: Secondary | ICD-10-CM | POA: Insufficient documentation

## 2016-09-21 DIAGNOSIS — Y929 Unspecified place or not applicable: Secondary | ICD-10-CM | POA: Diagnosis not present

## 2016-09-21 DIAGNOSIS — Y939 Activity, unspecified: Secondary | ICD-10-CM | POA: Diagnosis not present

## 2016-09-21 DIAGNOSIS — Z85820 Personal history of malignant melanoma of skin: Secondary | ICD-10-CM | POA: Diagnosis not present

## 2016-09-21 DIAGNOSIS — S61219A Laceration without foreign body of unspecified finger without damage to nail, initial encounter: Secondary | ICD-10-CM | POA: Diagnosis present

## 2016-09-21 DIAGNOSIS — F419 Anxiety disorder, unspecified: Secondary | ICD-10-CM | POA: Diagnosis not present

## 2016-09-21 DIAGNOSIS — S61212A Laceration without foreign body of right middle finger without damage to nail, initial encounter: Secondary | ICD-10-CM | POA: Diagnosis not present

## 2016-09-21 DIAGNOSIS — Z79899 Other long term (current) drug therapy: Secondary | ICD-10-CM | POA: Insufficient documentation

## 2016-09-21 LAB — RAPID URINE DRUG SCREEN, HOSP PERFORMED
Amphetamines: NOT DETECTED
Barbiturates: NOT DETECTED
Benzodiazepines: POSITIVE — AB
Cocaine: NOT DETECTED
Opiates: NOT DETECTED
Tetrahydrocannabinol: NOT DETECTED

## 2016-09-21 LAB — CBC WITH DIFFERENTIAL/PLATELET
BASOS ABS: 0.1 10*3/uL (ref 0.0–0.1)
Basophils Relative: 1 %
Eosinophils Absolute: 0.2 10*3/uL (ref 0.0–0.7)
Eosinophils Relative: 3 %
HEMATOCRIT: 42.1 % (ref 36.0–46.0)
Hemoglobin: 14.8 g/dL (ref 12.0–15.0)
LYMPHS PCT: 26 %
Lymphs Abs: 1.8 10*3/uL (ref 0.7–4.0)
MCH: 31 pg (ref 26.0–34.0)
MCHC: 35.2 g/dL (ref 30.0–36.0)
MCV: 88.1 fL (ref 78.0–100.0)
Monocytes Absolute: 0.4 10*3/uL (ref 0.1–1.0)
Monocytes Relative: 5 %
NEUTROS ABS: 4.6 10*3/uL (ref 1.7–7.7)
Neutrophils Relative %: 65 %
Platelets: 240 10*3/uL (ref 150–400)
RBC: 4.78 MIL/uL (ref 3.87–5.11)
RDW: 14.4 % (ref 11.5–15.5)
WBC: 7 10*3/uL (ref 4.0–10.5)

## 2016-09-21 LAB — SALICYLATE LEVEL: Salicylate Lvl: <7 mg/dL (ref 2.8–30.0)

## 2016-09-21 LAB — URINE MICROSCOPIC-ADD ON: RBC / HPF: NONE SEEN RBC/hpf (ref 0–5)

## 2016-09-21 LAB — COMPREHENSIVE METABOLIC PANEL
ALK PHOS: 116 U/L (ref 38–126)
ALT: 35 U/L (ref 14–54)
ANION GAP: 11 (ref 5–15)
AST: 57 U/L — ABNORMAL HIGH (ref 15–41)
Albumin: 4.7 g/dL (ref 3.5–5.0)
BUN: 7 mg/dL (ref 6–20)
CALCIUM: 8.8 mg/dL — AB (ref 8.9–10.3)
CO2: 25 mmol/L (ref 22–32)
Chloride: 91 mmol/L — ABNORMAL LOW (ref 101–111)
Creatinine, Ser: 0.71 mg/dL (ref 0.44–1.00)
GFR calc non Af Amer: >60 mL/min (ref >60)
Glucose, Bld: 135 mg/dL — ABNORMAL HIGH (ref 65–99)
Potassium: 3.4 mmol/L — ABNORMAL LOW (ref 3.5–5.1)
SODIUM: 127 mmol/L — AB (ref 135–145)
Total Bilirubin: 0.4 mg/dL (ref 0.3–1.2)
Total Protein: 7.9 g/dL (ref 6.5–8.1)

## 2016-09-21 LAB — URINALYSIS, ROUTINE W REFLEX MICROSCOPIC
BILIRUBIN URINE: NEGATIVE
Glucose, UA: NEGATIVE mg/dL
Hgb urine dipstick: NEGATIVE
Ketones, ur: NEGATIVE mg/dL
NITRITE: NEGATIVE
Protein, ur: 30 mg/dL — AB
SPECIFIC GRAVITY, URINE: 1.011 (ref 1.005–1.030)
pH: 6 (ref 5.0–8.0)

## 2016-09-21 LAB — PROTIME-INR
INR: 0.87
Prothrombin Time: 11.8 s (ref 11.4–15.2)

## 2016-09-21 LAB — ACETAMINOPHEN LEVEL: Acetaminophen (Tylenol), Serum: <10 ug/mL — ABNORMAL LOW (ref 10–30)

## 2016-09-21 LAB — ETHANOL: Alcohol, Ethyl (B): 58 mg/dL — ABNORMAL HIGH

## 2016-09-21 MED ORDER — LIDOCAINE HCL 1 % IJ SOLN
INTRAMUSCULAR | Status: AC
  Administered 2016-09-21: 20 mL
  Filled 2016-09-21: qty 20

## 2016-09-21 MED ORDER — CLONAZEPAM 0.5 MG PO TABS: 0.5000 mg | ORAL_TABLET | Freq: Two times a day (BID) | ORAL | 0 refills | Status: DC | PRN

## 2016-09-21 MED ORDER — LORAZEPAM 1 MG PO TABS
1.0000 mg | ORAL_TABLET | Freq: Once | ORAL | Status: AC
  Administered 2016-09-21: 1 mg via ORAL
  Filled 2016-09-21: qty 1
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
## 2016-09-21 NOTE — Progress Notes (Addendum)
Pt without a pcp and given a list of [employer] cross and blue shield providers  And pt interested in enrolling in medicare Cm offered her the medicare contact information for enrollment and discussed  Medicare Open Enrollment runs from Oct. 15 - Dec. 7 2017  Pt with 7 ED visits Not [facility] eligibility at this time Pt to follow pcp

## 2016-09-21 NOTE — ED Notes (Signed)
Pt currently eating calmly.  Earlier pt states she was anxious and need someone to talk to.  Pt was re-oriented and escorted back to her hallway bed from the nurse's station.

## 2016-09-21 NOTE — ED Notes (Signed)
Pt has been told a urine sample is needed from her, states that she will try to give one after she has something to drink.

## 2016-09-21 NOTE — ED Notes (Signed)
Bed: [facility] Expected date:  Expected time:  Means of arrival:  Comments: 

## 2016-09-21 NOTE — ED Triage Notes (Signed)
Per EMS pt presents with complaint of right middle finger laceration. Pt appears intoxicated but a/o x4. Pt injury 1 cm and superficial in nature.
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
## 2016-11-27 ENCOUNTER — Emergency Department (HOSPITAL_COMMUNITY)
Admission: EM | Admit: 2016-11-27 | Discharge: 2016-11-28 | Disposition: A | Discharge status: Home / Self Care | Payer: BLUE CROSS/BLUE SHIELD | Attending: Emergency Medicine | Admitting: Emergency Medicine

## 2016-11-27 ENCOUNTER — Encounter (HOSPITAL_COMMUNITY): Payer: Self-pay | Admitting: Vascular Surgery

## 2016-11-27 DIAGNOSIS — F419 Anxiety disorder, unspecified: Secondary | ICD-10-CM | POA: Diagnosis not present

## 2016-11-27 DIAGNOSIS — F1010 Alcohol abuse, uncomplicated: Secondary | ICD-10-CM | POA: Diagnosis not present

## 2016-11-27 DIAGNOSIS — Z85850 Personal history of malignant neoplasm of thyroid: Secondary | ICD-10-CM | POA: Insufficient documentation

## 2016-11-27 DIAGNOSIS — Z79899 Other long term (current) drug therapy: Secondary | ICD-10-CM | POA: Diagnosis not present

## 2016-11-27 DIAGNOSIS — F17210 Nicotine dependence, cigarettes, uncomplicated: Secondary | ICD-10-CM | POA: Diagnosis not present

## 2016-11-27 DIAGNOSIS — E871 Hypo-osmolality and hyponatremia: Secondary | ICD-10-CM | POA: Insufficient documentation

## 2016-11-27 DIAGNOSIS — F1510 Other stimulant abuse, uncomplicated: Secondary | ICD-10-CM | POA: Insufficient documentation

## 2016-11-27 DIAGNOSIS — Z720 Tobacco use: Secondary | ICD-10-CM

## 2016-11-27 DIAGNOSIS — F1590 Other stimulant use, unspecified, uncomplicated: Secondary | ICD-10-CM

## 2016-11-27 LAB — COMPREHENSIVE METABOLIC PANEL
ALT: 44 U/L (ref 14–54)
ANION GAP: 12 (ref 5–15)
AST: 104 U/L — AB (ref 15–41)
Albumin: 4.5 g/dL (ref 3.5–5.0)
Alkaline Phosphatase: 81 U/L (ref 38–126)
BILIRUBIN TOTAL: 0.8 mg/dL (ref 0.3–1.2)
BUN: 12 mg/dL (ref 6–20)
CO2: 22 mmol/L (ref 22–32)
Calcium: 8.9 mg/dL (ref 8.9–10.3)
Chloride: 94 mmol/L — ABNORMAL LOW (ref 101–111)
Creatinine, Ser: 0.8 mg/dL (ref 0.44–1.00)
Glucose, Bld: 88 mg/dL (ref 65–99)
POTASSIUM: 3.6 mmol/L (ref 3.5–5.1)
Sodium: 128 mmol/L — ABNORMAL LOW (ref 135–145)
TOTAL PROTEIN: 7.2 g/dL (ref 6.5–8.1)

## 2016-11-27 LAB — URINALYSIS, ROUTINE W REFLEX MICROSCOPIC
BILIRUBIN URINE: NEGATIVE
GLUCOSE, UA: NEGATIVE mg/dL
HGB URINE DIPSTICK: NEGATIVE
KETONES UR: NEGATIVE mg/dL
NITRITE: NEGATIVE
PH: 5 (ref 5.0–8.0)
Protein, ur: 100 mg/dL — AB
Specific Gravity, Urine: 1.019 (ref 1.005–1.030)

## 2016-11-27 LAB — CBC
HCT: 37 % (ref 36.0–46.0)
HEMOGLOBIN: 12.9 g/dL (ref 12.0–15.0)
MCH: 31.3 pg (ref 26.0–34.0)
MCHC: 34.9 g/dL (ref 30.0–36.0)
MCV: 89.8 fL (ref 78.0–100.0)
Platelets: 235 10*3/uL (ref 150–400)
RBC: 4.12 MIL/uL (ref 3.87–5.11)
RDW: 16 % — AB (ref 11.5–15.5)
WBC: 6.1 10*3/uL (ref 4.0–10.5)

## 2016-11-27 LAB — SALICYLATE LEVEL: Salicylate Lvl: <7 mg/dL (ref 2.8–30.0)

## 2016-11-27 LAB — ETHANOL: ALCOHOL ETHYL (B): 44 mg/dL — AB (ref <5)

## 2016-11-27 LAB — RAPID URINE DRUG SCREEN, HOSP PERFORMED
Amphetamines: POSITIVE — AB
Barbiturates: NOT DETECTED
Benzodiazepines: NOT DETECTED
COCAINE: NOT DETECTED
OPIATES: NOT DETECTED
TETRAHYDROCANNABINOL: NOT DETECTED

## 2016-11-27 LAB — ACETAMINOPHEN LEVEL: Acetaminophen (Tylenol), Serum: <10 ug/mL — ABNORMAL LOW (ref 10–30)

## 2016-11-27 MED ORDER — ZOLPIDEM TARTRATE 5 MG PO TABS: 5.0000 mg | ORAL_TABLET | Freq: Every evening | ORAL | Status: DC | PRN

## 2016-11-27 MED ORDER — ACETAMINOPHEN 325 MG PO TABS: 650.0000 mg | ORAL_TABLET | ORAL | Status: DC | PRN

## 2016-11-27 MED ORDER — THIAMINE HCL 100 MG/ML IJ SOLN: 100.0000 mg | Freq: Every day | INTRAMUSCULAR | Status: DC

## 2016-11-27 MED ORDER — IBUPROFEN 400 MG PO TABS: 600.0000 mg | ORAL_TABLET | Freq: Three times a day (TID) | ORAL | Status: DC | PRN

## 2016-11-27 MED ORDER — LORAZEPAM 1 MG PO TABS
0.0000 mg | ORAL_TABLET | Freq: Four times a day (QID) | ORAL | Status: DC
  Administered 2016-11-27: 2 mg via ORAL
  Filled 2016-11-27: qty 2

## 2016-11-27 MED ORDER — ALUM & MAG HYDROXIDE-SIMETH 200-200-20 MG/5ML PO SUSP: 30.0000 mL | ORAL | Status: DC | PRN

## 2016-11-27 MED ORDER — VITAMIN B-1 100 MG PO TABS
100.0000 mg | ORAL_TABLET | Freq: Every day | ORAL | Status: DC
  Administered 2016-11-27: 100 mg via ORAL
  Filled 2016-11-27: qty 1

## 2016-11-27 MED ORDER — LORAZEPAM 1 MG PO TABS: 0.0000 mg | ORAL_TABLET | Freq: Two times a day (BID) | ORAL | Status: DC

## 2016-11-27 MED ORDER — NICOTINE 21 MG/24HR TD PT24: 21.0000 mg | MEDICATED_PATCH | Freq: Every day | TRANSDERMAL | Status: DC

## 2016-11-27 MED ORDER — ONDANSETRON HCL 4 MG PO TABS: 4.0000 mg | ORAL_TABLET | Freq: Three times a day (TID) | ORAL | Status: DC | PRN

## 2016-11-27 NOTE — ED Triage Notes (Addendum)
Pt reports to the ED for eval of increasing anxiety. Pt reports she has had it for the past 10 months but today it became much worse. 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 Pt reports she needs her anxiety medication. she has not had any since May. She also has chronic pain and she has been drinking ETOH to help with the pain and anxiety. Last drink was today. Pt denies SI/HI or AVH. Pt very talkative and tangential.

## 2016-11-27 NOTE — ED Notes (Signed)
Pt requesting to leave. PA at bedside.
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
## 2016-11-27 NOTE — ED Notes (Signed)
PA at bedside.

## 2016-11-27 NOTE — ED Notes (Signed)
Patient denies si or hi

## 2016-11-28 NOTE — ED Notes (Signed)
Pt continues to ask for candy and soda to help with anxiety. Pt is informed that the breakfast tray will be here soon

## 2016-11-28 NOTE — ED Notes (Signed)
Regular diet for breakfast was ordered.

## 2016-11-28 NOTE — ED Provider Notes (Signed)
Patient reports that she wants to leave. She reports that she is here voluntarily. She needs to get home to take care of her pets. She is very worried that her husband is not caring for them. As per review of the notes and the patient's report, most of her issues surrounding her visit pertaining to relationship problems with her husband. The patient is not suicidal or homicidal. Patient acknowledges she has problems with alcohol and substance use which she is addressing and alcohol played a role in her initial presentation.  Patient is oriented to place person and time. She is nontoxic and well-appearing. She is sitting at the edge of the bed eating her breakfast. Mood is pleasant and cooperative.  Extraocular motions intact. Heart is regular no rub murmur gallop. Lungs are clear without wheeze rhonchi rail. All movements are coordinated purposeful and symmetric. Skin is warm and dry.  At this time, patient is here under voluntary condition. She shows no sign of suicidal or homicidal thought processes. She continues to perseverate on issues of her husband and his role in her mental and substance use problems. She however is situationally oriented and does not show signs of responding to internal stimuli. I do not see evidence of her being psychotic. Patient appears stable to manage without significant risk of self injury or risk of injury to others due to lack of insight and judgment. At this time, patient is medically cleared for discharge per her request.   Charlesetta Shanks, MD 11/28/16 [phone]
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
## 2016-11-28 NOTE — ED Notes (Signed)
Breakfast meal given 

## 2016-11-29 LAB — URINE CULTURE: Culture: >=100000 — AB

## 2016-11-30 ENCOUNTER — Telehealth (HOSPITAL_BASED_OUTPATIENT_CLINIC_OR_DEPARTMENT_OTHER): Payer: Self-pay

## 2016-11-30 NOTE — Telephone Encounter (Signed)
Post ED Visit - Positive Culture Follow-up  Culture report reviewed by antimicrobial stewardship pharmacist:  []  Elenor Quinones, Pharm.D. []  Heide Guile, Pharm.D., BCPS []  Parks Neptune, Pharm.D. []  Alycia Rossetti, Pharm.D., BCPS []  Olympia Fields, Pharm.D., BCPS, AAHIVP []  Legrand Como, Pharm.D., BCPS, AAHIVP []  Milus Glazier, Pharm.D. []  Stephens November, Pharm.DAbram Sander, Pharm.D.  Positive urine culture, >/= 100,000 colonies -> E Coli Reviewed by Montine Circle PA  Treated with cephalexin Asymptomatic bactremia per MD  Dortha Kern 11/30/2016, 5:50 PM

## 2017-05-02 ENCOUNTER — Emergency Department (HOSPITAL_COMMUNITY)
Admission: EM | Admit: 2017-05-02 | Discharge: 2017-05-04 | Disposition: A | Discharge status: Home / Self Care | Payer: BLUE CROSS/BLUE SHIELD | Attending: Emergency Medicine | Admitting: Emergency Medicine

## 2017-05-02 ENCOUNTER — Emergency Department (HOSPITAL_COMMUNITY): Payer: BLUE CROSS/BLUE SHIELD

## 2017-05-02 ENCOUNTER — Encounter (HOSPITAL_COMMUNITY): Payer: Self-pay

## 2017-05-02 DIAGNOSIS — Z85820 Personal history of malignant melanoma of skin: Secondary | ICD-10-CM | POA: Diagnosis not present

## 2017-05-02 DIAGNOSIS — Z85850 Personal history of malignant neoplasm of thyroid: Secondary | ICD-10-CM | POA: Diagnosis not present

## 2017-05-02 DIAGNOSIS — Z79899 Other long term (current) drug therapy: Secondary | ICD-10-CM | POA: Insufficient documentation

## 2017-05-02 DIAGNOSIS — F419 Anxiety disorder, unspecified: Secondary | ICD-10-CM | POA: Diagnosis present

## 2017-05-02 DIAGNOSIS — F22 Delusional disorders: Secondary | ICD-10-CM | POA: Insufficient documentation

## 2017-05-02 DIAGNOSIS — E039 Hypothyroidism, unspecified: Secondary | ICD-10-CM | POA: Diagnosis not present

## 2017-05-02 DIAGNOSIS — F17210 Nicotine dependence, cigarettes, uncomplicated: Secondary | ICD-10-CM | POA: Diagnosis not present

## 2017-05-02 DIAGNOSIS — F410 Panic disorder [episodic paroxysmal anxiety] without agoraphobia: Secondary | ICD-10-CM | POA: Diagnosis not present

## 2017-05-02 DIAGNOSIS — F411 Generalized anxiety disorder: Secondary | ICD-10-CM | POA: Diagnosis not present

## 2017-05-02 LAB — COMPREHENSIVE METABOLIC PANEL
ALBUMIN: 4.2 g/dL (ref 3.5–5.0)
ALK PHOS: 70 U/L (ref 38–126)
ALT: 42 U/L (ref 14–54)
AST: 118 U/L — ABNORMAL HIGH (ref 15–41)
Anion gap: 10 (ref 5–15)
BUN: 10 mg/dL (ref 6–20)
CALCIUM: 8.8 mg/dL — AB (ref 8.9–10.3)
CHLORIDE: 94 mmol/L — AB (ref 101–111)
CO2: 24 mmol/L (ref 22–32)
Creatinine, Ser: 0.78 mg/dL (ref 0.44–1.00)
GFR calc non Af Amer: >60 mL/min (ref >60)
GLUCOSE: 82 mg/dL (ref 65–99)
POTASSIUM: 3.3 mmol/L — AB (ref 3.5–5.1)
SODIUM: 128 mmol/L — AB (ref 135–145)
Total Bilirubin: 1.1 mg/dL (ref 0.3–1.2)
Total Protein: 6.8 g/dL (ref 6.5–8.1)

## 2017-05-02 LAB — SALICYLATE LEVEL

## 2017-05-02 LAB — CBC
HCT: 35.3 % — ABNORMAL LOW (ref 36.0–46.0)
HEMOGLOBIN: 12.6 g/dL (ref 12.0–15.0)
MCH: 33 pg (ref 26.0–34.0)
MCHC: 35.7 g/dL (ref 30.0–36.0)
MCV: 92.4 fL (ref 78.0–100.0)
Platelets: 209 10*3/uL (ref 150–400)
RBC: 3.82 MIL/uL — ABNORMAL LOW (ref 3.87–5.11)
RDW: 13.4 % (ref 11.5–15.5)
WBC: 6.1 10*3/uL (ref 4.0–10.5)

## 2017-05-02 LAB — ACETAMINOPHEN LEVEL

## 2017-05-02 LAB — ETHANOL: Alcohol, Ethyl (B): <5 mg/dL (ref <5)

## 2017-05-02 MED ORDER — ONDANSETRON HCL 4 MG PO TABS: 4.0000 mg | ORAL_TABLET | Freq: Three times a day (TID) | ORAL | Status: DC | PRN

## 2017-05-02 MED ORDER — ZOLPIDEM TARTRATE 5 MG PO TABS: 5.0000 mg | ORAL_TABLET | Freq: Every evening | ORAL | Status: DC | PRN
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
## 2017-05-02 NOTE — Progress Notes (Signed)
Consult request has been received. CSW attempting to follow up at present time.  Deisi Salonga F. Jerlene Rockers, LCSWA, LCAS Clinical Social Worker Ph: [phone]  

## 2017-05-02 NOTE — ED Notes (Signed)
Lab called and stated that pt's urine sample will have to be recollected

## 2017-05-02 NOTE — ED Notes (Signed)
Social work at bedside.  

## 2017-05-02 NOTE — ED Notes (Signed)
Pt transported to radiology.

## 2017-05-03 LAB — T4, FREE

## 2017-05-03 LAB — TSH: TSH: 104.026 u[IU]/mL — ABNORMAL HIGH (ref 0.350–4.500)

## 2017-05-03 MED ORDER — LORATADINE 10 MG PO TABS
10.0000 mg | ORAL_TABLET | Freq: Once | ORAL | Status: AC
  Administered 2017-05-03: 10 mg via ORAL
  Filled 2017-05-03: qty 1

## 2017-05-03 MED ORDER — LORAZEPAM 0.5 MG PO TABS
0.5000 mg | ORAL_TABLET | Freq: Once | ORAL | Status: AC
  Administered 2017-05-03: 0.5 mg via ORAL
  Filled 2017-05-03: qty 1

## 2017-05-03 MED ORDER — LORAZEPAM 1 MG PO TABS
1.0000 mg | ORAL_TABLET | Freq: Three times a day (TID) | ORAL | Status: DC | PRN
  Administered 2017-05-03: 1 mg via ORAL
  Filled 2017-05-03: qty 1
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
## 2017-05-03 NOTE — ED Notes (Signed)
TTS at bedside. 
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
## 2017-05-03 NOTE — BH Assessment (Signed)
[facility] Assessment Progress Note  Per Corena Pilgrim, MD, this pt does not require psychiatric hospitalization at this time.  Pt is to be discharged from [facility] with referral information for the Chemical Dependency Intensive Outpatient Program at the [facility] [place].  This Probation officer spoke Brandon Melnick, LCAS, who has scheduled pt for an intake appointment on Thursday, 05/09/2017 at 09:30. This has been included in pt's discharge instructions.  Pt's nurse, Caren Griffins, has been notified.  Jalene Mullet, [place] Triage Specialist [phone]

## 2017-05-03 NOTE — ED Notes (Signed)
Pt requesting medication for Anxiety. Will inform MD.

## 2017-05-03 NOTE — ED Notes (Signed)
Social work at bedside.  

## 2017-05-03 NOTE — BHH Counselor (Signed)
No EDP/PA/NP note in EPIC at this time. TTS assessment ordered. Per pt's nurse, EDP has seen pt and wants assessment due to paranoia.   Faylene Kurtz, MS, CRC, [place] [employer] [place] Triage Specialist [facility]

## 2017-05-03 NOTE — ED Provider Notes (Signed)
TTS consultation is appreciated. They do not feel she meets inpatient criteria. However, she does express significant paranoia which is affecting her ability to function. At this point, I feel that psychiatric evaluation would be appropriate, so she will be kept in the ED for psychiatric evaluation in the morning.   Delora Fuel, MD 87/68/11 [id number]
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
## 2017-05-03 NOTE — Discharge Instructions (Signed)
To help you maintain a sober lifestyle, a substance abuse treatment program may be beneficial to you.  You have been scheduled for an intake appointment with the Chemical Dependency Intensive Outpatient Program at the [facility] on Thursday, May 09, 2017 at 9:30.  You will meet with Brandon Melnick, LCAS, who can provide you with additional information about the program.  If you need to speak to her prior to the intake appointment, she may be reached at the phone number indicated below:       [facility]. [address]. [place], [place] [address]      Contact Person: Brandon Melnick, [place]      [phone]

## 2017-05-04 DIAGNOSIS — F410 Panic disorder [episodic paroxysmal anxiety] without agoraphobia: Secondary | ICD-10-CM

## 2017-05-04 DIAGNOSIS — F22 Delusional disorders: Secondary | ICD-10-CM | POA: Diagnosis not present

## 2017-05-04 DIAGNOSIS — F17210 Nicotine dependence, cigarettes, uncomplicated: Secondary | ICD-10-CM | POA: Diagnosis not present

## 2017-05-04 DIAGNOSIS — E039 Hypothyroidism, unspecified: Secondary | ICD-10-CM | POA: Diagnosis present

## 2017-05-04 DIAGNOSIS — F411 Generalized anxiety disorder: Secondary | ICD-10-CM

## 2017-05-04 MED ORDER — LEVOTHYROXINE SODIUM 100 MCG PO TABS
100.0000 ug | ORAL_TABLET | Freq: Every day | ORAL | Status: DC
  Administered 2017-05-04: 100 ug via ORAL
  Filled 2017-05-04: qty 1

## 2017-05-04 MED ORDER — LEVOTHYROXINE SODIUM 100 MCG PO TABS: 100.0000 ug | ORAL_TABLET | Freq: Every day | ORAL | 3 refills | Status: DC

## 2017-05-04 NOTE — ED Notes (Signed)
LCSW states pt has been deemed competent for d/c and that she has her own living arrangement away from her husband.
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
## 2017-05-04 NOTE — Progress Notes (Signed)
Chaplain responded to the request of the ED to assist with providing shoes for a patient being discharged. Chaplain received assistance from Security for entering the [employer]. The shoes were taken to the patient prior to her discharge. She was appreciative of the support. Chaplain Yaakov Guthrie [phone]
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
## 2017-05-04 NOTE — ED Notes (Addendum)
Pt given back all belongings except for black shoes which where nowhere to be found in this department. Chaplain was able to obtain a pair of shoes for this pt and pt was given them.
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
## 2017-05-08 ENCOUNTER — Encounter (HOSPITAL_COMMUNITY): Payer: Self-pay | Admitting: Emergency Medicine

## 2017-05-08 ENCOUNTER — Emergency Department (HOSPITAL_COMMUNITY)
Admission: EM | Admit: 2017-05-08 | Discharge: 2017-05-08 | Disposition: A | Discharge status: Home / Self Care | Payer: BLUE CROSS/BLUE SHIELD | Source: Home / Self Care | Attending: Emergency Medicine | Admitting: Emergency Medicine

## 2017-05-08 DIAGNOSIS — F419 Anxiety disorder, unspecified: Secondary | ICD-10-CM | POA: Insufficient documentation

## 2017-05-08 DIAGNOSIS — Z79899 Other long term (current) drug therapy: Secondary | ICD-10-CM

## 2017-05-08 DIAGNOSIS — F1120 Opioid dependence, uncomplicated: Secondary | ICD-10-CM | POA: Diagnosis not present

## 2017-05-08 DIAGNOSIS — E038 Other specified hypothyroidism: Secondary | ICD-10-CM

## 2017-05-08 DIAGNOSIS — M25572 Pain in left ankle and joints of left foot: Secondary | ICD-10-CM | POA: Insufficient documentation

## 2017-05-08 DIAGNOSIS — F411 Generalized anxiety disorder: Secondary | ICD-10-CM

## 2017-05-08 DIAGNOSIS — Z85850 Personal history of malignant neoplasm of thyroid: Secondary | ICD-10-CM

## 2017-05-08 DIAGNOSIS — E039 Hypothyroidism, unspecified: Secondary | ICD-10-CM

## 2017-05-08 DIAGNOSIS — F17210 Nicotine dependence, cigarettes, uncomplicated: Secondary | ICD-10-CM | POA: Diagnosis not present

## 2017-05-08 DIAGNOSIS — F3160 Bipolar disorder, current episode mixed, unspecified: Secondary | ICD-10-CM | POA: Diagnosis not present

## 2017-05-08 DIAGNOSIS — F410 Panic disorder [episodic paroxysmal anxiety] without agoraphobia: Secondary | ICD-10-CM

## 2017-05-08 DIAGNOSIS — Z85820 Personal history of malignant melanoma of skin: Secondary | ICD-10-CM | POA: Insufficient documentation

## 2017-05-08 DIAGNOSIS — C73 Malignant neoplasm of thyroid gland: Secondary | ICD-10-CM | POA: Diagnosis not present

## 2017-05-08 DIAGNOSIS — F22 Delusional disorders: Secondary | ICD-10-CM | POA: Diagnosis not present

## 2017-05-08 LAB — CBC WITH DIFFERENTIAL/PLATELET
BASOS ABS: 0.1 10*3/uL (ref 0.0–0.1)
Basophils Relative: 1 %
EOS PCT: 4 %
Eosinophils Absolute: 0.2 10*3/uL (ref 0.0–0.7)
HEMATOCRIT: 35.3 % — AB (ref 36.0–46.0)
Hemoglobin: 12.7 g/dL (ref 12.0–15.0)
LYMPHS ABS: 1.6 10*3/uL (ref 0.7–4.0)
LYMPHS PCT: 31 %
MCH: 33.3 pg (ref 26.0–34.0)
MCHC: 36 g/dL (ref 30.0–36.0)
MCV: 92.7 fL (ref 78.0–100.0)
MONO ABS: 0.5 10*3/uL (ref 0.1–1.0)
Monocytes Relative: 10 %
NEUTROS ABS: 2.7 10*3/uL (ref 1.7–7.7)
Neutrophils Relative %: 54 %
Platelets: 214 10*3/uL (ref 150–400)
RBC: 3.81 MIL/uL — ABNORMAL LOW (ref 3.87–5.11)
RDW: 13.4 % (ref 11.5–15.5)
WBC: 5 10*3/uL (ref 4.0–10.5)

## 2017-05-08 LAB — COMPREHENSIVE METABOLIC PANEL
ALBUMIN: 4.7 g/dL (ref 3.5–5.0)
ALT: 41 U/L (ref 14–54)
AST: 93 U/L — ABNORMAL HIGH (ref 15–41)
Alkaline Phosphatase: 75 U/L (ref 38–126)
Anion gap: 11 (ref 5–15)
BUN: 7 mg/dL (ref 6–20)
CHLORIDE: 93 mmol/L — AB (ref 101–111)
CO2: 25 mmol/L (ref 22–32)
Calcium: 9.1 mg/dL (ref 8.9–10.3)
Creatinine, Ser: 0.77 mg/dL (ref 0.44–1.00)
GFR calc Af Amer: >60 mL/min (ref >60)
GLUCOSE: 79 mg/dL (ref 65–99)
POTASSIUM: 3.2 mmol/L — AB (ref 3.5–5.1)
SODIUM: 129 mmol/L — AB (ref 135–145)
Total Bilirubin: 0.8 mg/dL (ref 0.3–1.2)
Total Protein: 7.3 g/dL (ref 6.5–8.1)

## 2017-05-08 LAB — ETHANOL

## 2017-05-08 MED ORDER — LEVOTHYROXINE SODIUM 100 MCG PO TABS: 100.0000 ug | ORAL_TABLET | Freq: Every day | ORAL | Status: DC

## 2017-05-08 MED ORDER — POTASSIUM CHLORIDE CRYS ER 20 MEQ PO TBCR
20.0000 meq | EXTENDED_RELEASE_TABLET | Freq: Once | ORAL | Status: AC
  Administered 2017-05-08: 20 meq via ORAL
  Filled 2017-05-08: qty 1

## 2017-05-08 MED ORDER — POTASSIUM CHLORIDE CRYS ER 20 MEQ PO TBCR: 20.0000 meq | EXTENDED_RELEASE_TABLET | Freq: Once | ORAL | Status: DC

## 2017-05-08 MED ORDER — ONDANSETRON 4 MG PO TBDP
4.0000 mg | ORAL_TABLET | Freq: Once | ORAL | Status: AC
  Administered 2017-05-08: 4 mg via ORAL
  Filled 2017-05-08: qty 1

## 2017-05-08 NOTE — BH Assessment (Signed)
[facility] Assessment Progress Note  Per Corena Pilgrim, MD, this pt does not require psychiatric hospitalization at this time.  Pt is to be discharged from [facility].  She has previously be scheduled to start the Chemical Dependency Intensive Outpatient Program at the [facility] at [facility], and is to present for orientation with Brandon Melnick, LCAS, tomorrow, 05/09/2017 at 09:30.  This has been included in pt's discharge instructions.  Pt's nurse has been notified.  Jalene Mullet, [facility] Triage Specialist [phone]
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
## 2017-05-08 NOTE — ED Notes (Signed)
Bed: WA30 Expected date:  Expected time:  Means of arrival:  Comments: 
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
## 2017-05-08 NOTE — ED Notes (Signed)
Patient continues to pace the floor around nursing station and in the room. Disorganized thinking as she speaks of random conversations. States she doesn't know where she is then later comes to nursing station and states "I am at [facility] correct?" Refusing to take potassium additional supplement stating "i don't know what yall are trying to give me all of this is just weird." Closing doors and curtains and peeping out behind them as if she is looking to see what others on the outside of room is doing and then opens curtains and doors laughing and saying, "I can see you all".

## 2017-05-08 NOTE — ED Notes (Signed)
Pt c/o pain all over, including left arm and shoulder pain from a recent fall. Large hematoma noted to bend of left arm.

## 2017-05-08 NOTE — Discharge Instructions (Signed)
To help you maintain a sober lifestyle, a substance abuse treatment program may be beneficial to you.  You have been scheduled for an intake appointment with the Chemical Dependency Intensive Outpatient Program at the [facility] at [facility] on Thursday, May 09, 2017 at 9:30.  You will meet with Brandon Melnick, LCAS, who can provide you with additional information about the program.  If you need to speak to her prior to the intake appointment, she may be reached at the phone number indicated below:       [facility] at [place] [place], [place] [address]      Contact Person: Brandon Melnick, [place]      [phone]

## 2017-05-08 NOTE — ED Triage Notes (Signed)
Pt brought in by EMS after calling 911 multiple times tonight  Pt is intoxicated and confused  Pt is c/o left ankle pain  Pt states she broke her ankle 4 years ago  Denies new injury  Pt also states she had a mole taken off of her left breast and wants the spot where it was looked at  Pt states she has anxiety and cannot sleep and has nightmares

## 2017-05-09 ENCOUNTER — Emergency Department (HOSPITAL_COMMUNITY)
Admission: EM | Admit: 2017-05-09 | Discharge: 2017-05-09 | Disposition: A | Discharge status: Other Acute Inpatient Hospital | Payer: BLUE CROSS/BLUE SHIELD | Source: Home / Self Care | Attending: Emergency Medicine | Admitting: Emergency Medicine

## 2017-05-09 ENCOUNTER — Encounter (HOSPITAL_COMMUNITY): Payer: Self-pay

## 2017-05-09 ENCOUNTER — Ambulatory Visit (HOSPITAL_COMMUNITY): Payer: Self-pay | Admitting: Psychology

## 2017-05-09 ENCOUNTER — Emergency Department (HOSPITAL_COMMUNITY)
Admission: EM | Admit: 2017-05-09 | Discharge: 2017-05-10 | Disposition: A | Discharge status: Home / Self Care | Payer: BLUE CROSS/BLUE SHIELD | Attending: Physician Assistant | Admitting: Physician Assistant

## 2017-05-09 DIAGNOSIS — C73 Malignant neoplasm of thyroid gland: Secondary | ICD-10-CM | POA: Insufficient documentation

## 2017-05-09 DIAGNOSIS — F410 Panic disorder [episodic paroxysmal anxiety] without agoraphobia: Secondary | ICD-10-CM | POA: Insufficient documentation

## 2017-05-09 DIAGNOSIS — E039 Hypothyroidism, unspecified: Secondary | ICD-10-CM | POA: Insufficient documentation

## 2017-05-09 DIAGNOSIS — F419 Anxiety disorder, unspecified: Secondary | ICD-10-CM | POA: Insufficient documentation

## 2017-05-09 DIAGNOSIS — F1120 Opioid dependence, uncomplicated: Secondary | ICD-10-CM | POA: Insufficient documentation

## 2017-05-09 DIAGNOSIS — F3160 Bipolar disorder, current episode mixed, unspecified: Secondary | ICD-10-CM | POA: Insufficient documentation

## 2017-05-09 LAB — COMPREHENSIVE METABOLIC PANEL
ALK PHOS: 80 U/L (ref 38–126)
ALT: 38 U/L (ref 14–54)
AST: 89 U/L — AB (ref 15–41)
Albumin: 4.7 g/dL (ref 3.5–5.0)
Anion gap: 11 (ref 5–15)
BUN: 12 mg/dL (ref 6–20)
CALCIUM: 9.4 mg/dL (ref 8.9–10.3)
CHLORIDE: 93 mmol/L — AB (ref 101–111)
CO2: 24 mmol/L (ref 22–32)
CREATININE: 0.95 mg/dL (ref 0.44–1.00)
GFR calc Af Amer: >60 mL/min (ref >60)
Glucose, Bld: 122 mg/dL — ABNORMAL HIGH (ref 65–99)
Potassium: 3.1 mmol/L — ABNORMAL LOW (ref 3.5–5.1)
Sodium: 128 mmol/L — ABNORMAL LOW (ref 135–145)
Total Bilirubin: 0.9 mg/dL (ref 0.3–1.2)
Total Protein: 7.4 g/dL (ref 6.5–8.1)

## 2017-05-09 LAB — CBC
HEMATOCRIT: 38 % (ref 36.0–46.0)
HEMOGLOBIN: 13 g/dL (ref 12.0–15.0)
MCH: 32.7 pg (ref 26.0–34.0)
MCHC: 34.2 g/dL (ref 30.0–36.0)
MCV: 95.5 fL (ref 78.0–100.0)
Platelets: 239 10*3/uL (ref 150–400)
RBC: 3.98 MIL/uL (ref 3.87–5.11)
RDW: 13.6 % (ref 11.5–15.5)
WBC: 6.5 10*3/uL (ref 4.0–10.5)

## 2017-05-09 LAB — RAPID URINE DRUG SCREEN, HOSP PERFORMED
AMPHETAMINES: NOT DETECTED
Barbiturates: NOT DETECTED
Benzodiazepines: POSITIVE — AB
Cocaine: NOT DETECTED
Opiates: NOT DETECTED
TETRAHYDROCANNABINOL: NOT DETECTED

## 2017-05-09 LAB — ACETAMINOPHEN LEVEL

## 2017-05-09 LAB — SALICYLATE LEVEL: Salicylate Lvl: <7 mg/dL (ref 2.8–30.0)

## 2017-05-09 LAB — ETHANOL

## 2017-05-09 MED ORDER — ALPRAZOLAM 0.25 MG PO TABS
0.5000 mg | ORAL_TABLET | Freq: Once | ORAL | Status: AC
  Administered 2017-05-09: 0.5 mg via ORAL
  Filled 2017-05-09: qty 2

## 2017-05-09 MED ORDER — ACETAMINOPHEN 325 MG PO TABS: 650.0000 mg | ORAL_TABLET | ORAL | Status: DC | PRN

## 2017-05-09 MED ORDER — LORAZEPAM 1 MG PO TABS
1.0000 mg | ORAL_TABLET | Freq: Four times a day (QID) | ORAL | Status: DC | PRN
  Administered 2017-05-09 – 2017-05-10 (×2): 1 mg via ORAL
  Filled 2017-05-09 (×2): qty 1

## 2017-05-09 NOTE — ED Notes (Signed)
When pt called, she stated that she did not know if she want to be triaged. She said, "I want to do the right thing. I have someone coming to pick me up. I don't want to do wrong by her. I want to make the right decision. I'm not sure what to do." Pt stated she rode a cab here and was dropped off by herself.
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
## 2017-05-09 NOTE — ED Notes (Signed)
Pt wandering out of room, c/o anxiety to sitters on unit.  Will discuss medication with PA.
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
## 2017-05-09 NOTE — ED Notes (Signed)
Pt provided with turkey sandwich and water

## 2017-05-09 NOTE — ED Notes (Signed)
Called x 1 for triage with no answer
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
## 2017-05-09 NOTE — ED Notes (Signed)
ED Provider at bedside. 

## 2017-05-09 NOTE — BH Assessment (Signed)
Tele Assessment Note   Susan Mccarthy is an 66 y.o. female. Pt denies SI/HI and AVH. Pt states she is need of her anxiety medication and needs to know where her appointment is located this morning. The Pt cannot recall what anxiety medication she is currently prescribed. Per Pt her PCP prescribes her anxiety medication.The Pt stated that she has a 10am appointment at [facility] this morning. The Pt was seen at [facility] for the same yesterday. The Pt reports previous outpatient treatment. Pt reports inpatient treatment in 1993 for depression. Pt reports 1 previous SI attempt. Pt states she lives alone and has limited family support.  Margarita Grizzle, NP recommends D/C and for the Pt will follow-up with her outpatient appointment today at 10am.  Diagnosis:  F41.1 Generalized anxiety disorder  Past Medical History:  Past Medical History:  Diagnosis Date  . Anxiety   . Arthritis   . Depression   . History of chemotherapy    INTERFERON  . Insomnia   . Melanoma ([place]) 1980   RIGHT BACK  . Thyroid ca ([place])   . Thyroid disease    85 % BENIGN    Past Surgical History:  Procedure Laterality Date  . AXILLARY NODE DISSECTION  08/02/11   right axillary,  . Excision of melanoma  1980  . PERCUTANEOUS PINNING Left 08/27/2013   Procedure: PERCUTANEOUS PINNING EXTREMITY;  Surgeon: Rozanna Box, MD;  Location: [place];  Service: Orthopedics;  Laterality: Left;  . RASH     ON FACE AND HANDS S/P CHEMOTHERAPY  . SACRO-ILIAC PINNING Bilateral 08/27/2013   Procedure: Dub Mikes;  Surgeon: Rozanna Box, MD;  Location: [place];  Service: Orthopedics;  Laterality: Bilateral;  Handy Bed, OIC Cannulated Screws  . THYROIDECTOMY  1/14   cancer    Family History:  Family History  Problem Relation Age of Onset  . ALS Father     Social History:  reports that she has been smoking Cigarettes.  She started smoking about 43 years ago. She has a 1.25 pack-year smoking history. She has never used smokeless tobacco.  She reports that she drinks alcohol. She reports that she does not use drugs.  Additional Social History:  Alcohol / Drug Use Pain Medications: please see mar Prescriptions: please see mar Over the Counter: please see mar History of alcohol / drug use?: No history of alcohol / drug abuse Longest period of sobriety (when/how long): NA Substance #1 Name of Substance 1: alcohol 1 - Age of First Use: unknown 1 - Amount (size/oz): unknown 1 - Frequency: unknown 1 - Duration: unknown 1 - Last Use / Amount: unknown  CIWA: CIWA-Ar BP: (!) 120/93 Pulse Rate: 62 COWS:    PATIENT STRENGTHS: (choose at least two) Average or above average intelligence Communication skills  Allergies:  Allergies  Allergen Reactions  . Other Rash and Other (See Comments)    Pt states that she is allergic to anything scented.     Home Medications:  (Not in a hospital admission)  OB/GYN Status:  Patient's last menstrual period was 10/24/2011.  General Assessment Data Location of Assessment: [facility] ED TTS Assessment: In system Is this a Tele or Face-to-Face Assessment?: Tele Assessment Is this an Initial Assessment or a Re-assessment for this encounter?: Initial Assessment Marital status: Separated Maiden name: NA Is patient pregnant?: No Pregnancy Status: No Living Arrangements: Alone Can pt return to current living arrangement?: Yes Admission Status: Voluntary Is patient capable of signing voluntary admission?: Yes Referral Source: Self/Family/Friend  Insurance type: [employer]     Crisis Care Plan Living Arrangements: Alone Legal Guardian: Other: (self) Name of Psychiatrist: NA Name of Therapist: NA  Education Status Is patient currently in school?: No Current Grade: NA Highest grade of school patient has completed: BA Name of school: NA Contact person: NA  Risk to self with the past 6 months Suicidal Ideation: No Has patient been a risk to self within the past 6 months prior to admission?  : No Suicidal Intent: No Has patient had any suicidal intent within the past 6 months prior to admission? : No Is patient at risk for suicide?: No Suicidal Plan?: No Has patient had any suicidal plan within the past 6 months prior to admission? : No Access to Means: No What has been your use of drugs/alcohol within the last 12 months?: alcohol use Previous Attempts/Gestures: Yes How many times?: 1 Other Self Harm Risks: NA Triggers for Past Attempts: Family contact Intentional Self Injurious Behavior: None Family Suicide History: No Recent stressful life event(s): Conflict (Comment) Persecutory voices/beliefs?: No Depression: Yes Depression Symptoms: Loss of interest in usual pleasures, Fatigue Substance abuse history and/or treatment for substance abuse?: Yes Suicide prevention information given to non-admitted patients: Not applicable  Risk to Others within the past 6 months Homicidal Ideation: No Does patient have any lifetime risk of violence toward others beyond the six months prior to admission? : No Thoughts of Harm to Others: No Current Homicidal Intent: No Current Homicidal Plan: No Access to Homicidal Means: No Identified Victim: NA History of harm to others?: No Assessment of Violence: None Noted Violent Behavior Description: NA Does patient have access to weapons?: No Criminal Charges Pending?: No Does patient have a court date: No Is patient on probation?: No  Psychosis Hallucinations: None noted Delusions: None noted  Mental Status Report Appearance/Hygiene: Unremarkable, In scrubs Eye Contact: Fair Motor Activity: Freedom of movement Speech: Logical/coherent, Slow Level of Consciousness: Alert Mood: Depressed Affect: Anxious, Blunted, Depressed Anxiety Level: Minimal Thought Processes: Coherent, Relevant Judgement: Unimpaired Orientation: Person, Place, Time, Situation Obsessive Compulsive Thoughts/Behaviors: None  Cognitive  Functioning Concentration: Normal Memory: Recent Intact, Remote Intact IQ: Average Insight: Fair Impulse Control: Fair Appetite: Fair Weight Loss: 0 Weight Gain: 0 Sleep: Decreased Total Hours of Sleep: 4 Vegetative Symptoms: None  ADLScreening [facility] Assessment Services) Patient's cognitive ability adequate to safely complete daily activities?: Yes Patient able to express need for assistance with ADLs?: Yes Independently performs ADLs?: Yes (appropriate for developmental age)  Prior Inpatient Therapy Prior Inpatient Therapy: Yes Prior Therapy Dates: 1993 Prior Therapy Facilty/Provider(s): [facility] Reason for Treatment: depression  Prior Outpatient Therapy Prior Outpatient Therapy: Yes Prior Therapy Dates: unknown Prior Therapy Facilty/Provider(s): unknown Reason for Treatment: NA Does patient have an ACCT team?: No Does patient have Intensive In-House Services?  : No Does patient have [facility] services? : No Does patient have P4CC services?: No  ADL Screening (condition at time of admission) Patient's cognitive ability adequate to safely complete daily activities?: Yes Is the patient deaf or have difficulty hearing?: No Does the patient have difficulty seeing, even when wearing glasses/contacts?: No Does the patient have difficulty concentrating, remembering, or making decisions?: No Patient able to express need for assistance with ADLs?: Yes Does the patient have difficulty dressing or bathing?: No Independently performs ADLs?: Yes (appropriate for developmental age) Does the patient have difficulty walking or climbing stairs?: No Weakness of Legs: None Weakness of Arms/Hands: None       Abuse/Neglect Assessment (Assessment to be complete  while patient is alone) Physical Abuse: Denies Verbal Abuse: Denies Sexual Abuse: Denies Exploitation of patient/patient's resources: Denies Self-Neglect: Denies     Regulatory affairs officer (For Healthcare) Does Patient Have a  Medical Advance Directive?: No    Additional Information 1:1 In Past 12 Months?: No CIRT Risk: No Elopement Risk: No Does patient have medical clearance?: Yes     Disposition:  Disposition Initial Assessment Completed for this Encounter: Yes  Demetrios Byron D 05/09/2017 7:52 AM

## 2017-05-09 NOTE — ED Notes (Signed)
[facility] called for transport to [facility] outpatient appointment at 1000.
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
## 2017-05-09 NOTE — ED Notes (Signed)
TTS being completed. 

## 2017-05-09 NOTE — ED Notes (Addendum)
Pt talking to this RN after administration of ativan.  Patient is anxious but pleasant, making conversation with this RN.  She is grateful for the ativan and is eating a snack at this time.  Pt given cookies and decaf coffee.

## 2017-05-09 NOTE — ED Notes (Signed)
A Regular Diet was ordered for Dinner.

## 2017-05-09 NOTE — ED Notes (Signed)
Patient was up wandering the unit, asking this RN and one of the sitters when her medication would start working.  This RN informed her that pills take about 45 minutes to work.  Patient went to lay down in her room after this RN assured her the medication should start working soon.  Patient is now sleeping at this time.

## 2017-05-09 NOTE — ED Notes (Signed)
EDP at bedside  
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
## 2017-05-09 NOTE — ED Notes (Addendum)
Pt. refused RN to check her vital signs despite nurse's explanation on process/plan of care .

## 2017-05-09 NOTE — ED Notes (Signed)
Patient was given a snack and drink. 

## 2017-05-09 NOTE — ED Notes (Signed)
Belongings inventoried and placed in [place] [address].

## 2017-05-09 NOTE — ED Notes (Signed)
Patient  standing at desk Talking.

## 2017-05-09 NOTE — ED Notes (Signed)
Patient given purple scrubs to change into 

## 2017-05-10 DIAGNOSIS — F419 Anxiety disorder, unspecified: Secondary | ICD-10-CM

## 2017-05-10 DIAGNOSIS — F17210 Nicotine dependence, cigarettes, uncomplicated: Secondary | ICD-10-CM

## 2017-05-10 DIAGNOSIS — E039 Hypothyroidism, unspecified: Secondary | ICD-10-CM

## 2017-05-10 DIAGNOSIS — F22 Delusional disorders: Secondary | ICD-10-CM | POA: Diagnosis not present

## 2017-05-10 MED ORDER — LORATADINE 10 MG PO TABS
10.0000 mg | ORAL_TABLET | Freq: Every day | ORAL | Status: DC
  Administered 2017-05-10: 10 mg via ORAL
  Filled 2017-05-10: qty 1

## 2017-05-10 MED ORDER — LEVOTHYROXINE SODIUM 100 MCG PO TABS
100.0000 ug | ORAL_TABLET | Freq: Every day | ORAL | Status: DC
  Administered 2017-05-10: 100 ug via ORAL
  Filled 2017-05-10: qty 1

## 2017-05-10 NOTE — ED Notes (Signed)
Pt. Wanting to call her counselor [name] to speak with her. Pt. Made aware that she has a reevaluation this morning with our counselors. Pt. States she feels much better and thinks she is alright to leave.

## 2017-05-10 NOTE — ED Notes (Signed)
Page contacted. No note for patient d/c yet.

## 2017-05-10 NOTE — ED Provider Notes (Signed)
Patient in the emergency department with increasing anxiety. She's been evaluated by behavioral health with plan to DC home with outpatient follow-up. She is calm and alert with no SI, HI, paranoia or hallucinations on examination. Counseled patient on the importance of outpatient follow-up. Discussed return precautions. BMP with stable hyponatremia.   Quintella Reichert, MD 05/10/17 1520
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
## 2017-05-10 NOTE — ED Notes (Signed)
[facility] contacted RN and is recommending pt. D/C. Will wait for note.

## 2017-05-10 NOTE — ED Notes (Signed)
TTS with patient. 

## 2017-05-10 NOTE — ED Notes (Signed)
Pt. On phone canceling counselor appointment for today.

## 2017-05-11 ENCOUNTER — Encounter (HOSPITAL_COMMUNITY): Payer: Self-pay | Admitting: Oncology

## 2017-05-11 ENCOUNTER — Emergency Department (HOSPITAL_COMMUNITY): Payer: BLUE CROSS/BLUE SHIELD

## 2017-05-11 ENCOUNTER — Emergency Department (HOSPITAL_COMMUNITY)
Admission: EM | Admit: 2017-05-11 | Discharge: 2017-05-11 | Disposition: A | Discharge status: Home / Self Care | Payer: BLUE CROSS/BLUE SHIELD | Attending: Emergency Medicine | Admitting: Emergency Medicine

## 2017-05-11 DIAGNOSIS — Y939 Activity, unspecified: Secondary | ICD-10-CM | POA: Insufficient documentation

## 2017-05-11 DIAGNOSIS — Y929 Unspecified place or not applicable: Secondary | ICD-10-CM | POA: Diagnosis not present

## 2017-05-11 DIAGNOSIS — M25511 Pain in right shoulder: Secondary | ICD-10-CM | POA: Insufficient documentation

## 2017-05-11 DIAGNOSIS — F10120 Alcohol abuse with intoxication, uncomplicated: Secondary | ICD-10-CM | POA: Insufficient documentation

## 2017-05-11 DIAGNOSIS — Y999 Unspecified external cause status: Secondary | ICD-10-CM | POA: Diagnosis not present

## 2017-05-11 DIAGNOSIS — M25552 Pain in left hip: Secondary | ICD-10-CM | POA: Insufficient documentation

## 2017-05-11 DIAGNOSIS — F17210 Nicotine dependence, cigarettes, uncomplicated: Secondary | ICD-10-CM | POA: Diagnosis not present

## 2017-05-11 DIAGNOSIS — D649 Anemia, unspecified: Secondary | ICD-10-CM | POA: Diagnosis not present

## 2017-05-11 DIAGNOSIS — W1839XA Other fall on same level, initial encounter: Secondary | ICD-10-CM | POA: Insufficient documentation

## 2017-05-11 DIAGNOSIS — W19XXXA Unspecified fall, initial encounter: Secondary | ICD-10-CM

## 2017-05-11 DIAGNOSIS — R740 Nonspecific elevation of levels of transaminase and lactic acid dehydrogenase [LDH]: Secondary | ICD-10-CM | POA: Insufficient documentation

## 2017-05-11 DIAGNOSIS — Z79899 Other long term (current) drug therapy: Secondary | ICD-10-CM | POA: Diagnosis not present

## 2017-05-11 DIAGNOSIS — F1010 Alcohol abuse, uncomplicated: Secondary | ICD-10-CM

## 2017-05-11 DIAGNOSIS — Z85850 Personal history of malignant neoplasm of thyroid: Secondary | ICD-10-CM | POA: Diagnosis not present

## 2017-05-11 DIAGNOSIS — N3001 Acute cystitis with hematuria: Secondary | ICD-10-CM | POA: Insufficient documentation

## 2017-05-11 DIAGNOSIS — F10920 Alcohol use, unspecified with intoxication, uncomplicated: Secondary | ICD-10-CM

## 2017-05-11 DIAGNOSIS — M25551 Pain in right hip: Secondary | ICD-10-CM | POA: Insufficient documentation

## 2017-05-11 DIAGNOSIS — E871 Hypo-osmolality and hyponatremia: Secondary | ICD-10-CM | POA: Insufficient documentation

## 2017-05-11 DIAGNOSIS — E039 Hypothyroidism, unspecified: Secondary | ICD-10-CM | POA: Diagnosis not present

## 2017-05-11 DIAGNOSIS — R7401 Elevation of levels of liver transaminase levels: Secondary | ICD-10-CM

## 2017-05-11 DIAGNOSIS — R4182 Altered mental status, unspecified: Secondary | ICD-10-CM | POA: Diagnosis present

## 2017-05-11 LAB — URINALYSIS, ROUTINE W REFLEX MICROSCOPIC
Bilirubin Urine: NEGATIVE
Glucose, UA: NEGATIVE mg/dL
Hgb urine dipstick: NEGATIVE
Ketones, ur: NEGATIVE mg/dL
Leukocytes, UA: NEGATIVE
Nitrite: POSITIVE — AB
Protein, ur: NEGATIVE mg/dL
Specific Gravity, Urine: 1.003 — ABNORMAL LOW (ref 1.005–1.030)
pH: 6 (ref 5.0–8.0)

## 2017-05-11 LAB — COMPREHENSIVE METABOLIC PANEL
ALBUMIN: 4.2 g/dL (ref 3.5–5.0)
ALK PHOS: 69 U/L (ref 38–126)
ALT: 36 U/L (ref 14–54)
ANION GAP: 11 (ref 5–15)
AST: 88 U/L — AB (ref 15–41)
BILIRUBIN TOTAL: 0.7 mg/dL (ref 0.3–1.2)
BUN: 9 mg/dL (ref 6–20)
CALCIUM: 8.6 mg/dL — AB (ref 8.9–10.3)
CO2: 23 mmol/L (ref 22–32)
Chloride: 92 mmol/L — ABNORMAL LOW (ref 101–111)
Creatinine, Ser: 0.74 mg/dL (ref 0.44–1.00)
GFR calc Af Amer: >60 mL/min (ref >60)
GFR calc non Af Amer: >60 mL/min (ref >60)
GLUCOSE: 77 mg/dL (ref 65–99)
Potassium: 3.4 mmol/L — ABNORMAL LOW (ref 3.5–5.1)
SODIUM: 126 mmol/L — AB (ref 135–145)
Total Protein: 6.7 g/dL (ref 6.5–8.1)

## 2017-05-11 LAB — CBC WITH DIFFERENTIAL/PLATELET
Basophils Absolute: 0 10*3/uL (ref 0.0–0.1)
Basophils Relative: 0 %
Eosinophils Absolute: 0.2 10*3/uL (ref 0.0–0.7)
Eosinophils Relative: 2 %
HEMATOCRIT: 32.6 % — AB (ref 36.0–46.0)
HEMOGLOBIN: 11.5 g/dL — AB (ref 12.0–15.0)
LYMPHS ABS: 1.1 10*3/uL (ref 0.7–4.0)
Lymphocytes Relative: 10 %
MCH: 33.1 pg (ref 26.0–34.0)
MCHC: 35.3 g/dL (ref 30.0–36.0)
MCV: 93.9 fL (ref 78.0–100.0)
MONO ABS: 0.8 10*3/uL (ref 0.1–1.0)
Monocytes Relative: 7 %
NEUTROS ABS: 9.4 10*3/uL — AB (ref 1.7–7.7)
NEUTROS PCT: 81 %
Platelets: 182 10*3/uL (ref 150–400)
RBC: 3.47 MIL/uL — ABNORMAL LOW (ref 3.87–5.11)
RDW: 13.5 % (ref 11.5–15.5)
WBC: 11.6 10*3/uL — ABNORMAL HIGH (ref 4.0–10.5)

## 2017-05-11 LAB — RAPID URINE DRUG SCREEN, HOSP PERFORMED
AMPHETAMINES: NOT DETECTED
BENZODIAZEPINES: NOT DETECTED
Barbiturates: NOT DETECTED
COCAINE: NOT DETECTED
OPIATES: NOT DETECTED
TETRAHYDROCANNABINOL: NOT DETECTED

## 2017-05-11 LAB — ETHANOL: Alcohol, Ethyl (B): 79 mg/dL — ABNORMAL HIGH (ref <5)

## 2017-05-11 MED ORDER — NITROFURANTOIN MONOHYD MACRO 100 MG PO CAPS
100.0000 mg | ORAL_CAPSULE | Freq: Once | ORAL | Status: AC
Start: 2017-05-11 — End: 2017-05-11
  Administered 2017-05-11: 100 mg via ORAL
  Filled 2017-05-11: qty 1

## 2017-05-11 MED ORDER — LORAZEPAM 1 MG PO TABS
1.0000 mg | ORAL_TABLET | Freq: Once | ORAL | Status: DC
Start: 2017-05-11 — End: 2017-05-11

## 2017-05-11 MED ORDER — LORAZEPAM 2 MG/ML IJ SOLN
1.0000 mg | Freq: Once | INTRAMUSCULAR | Status: AC
  Administered 2017-05-11: 1 mg via INTRAMUSCULAR
  Filled 2017-05-11: qty 1

## 2017-05-11 MED ORDER — NITROFURANTOIN MONOHYD MACRO 100 MG PO CAPS
100.0000 mg | ORAL_CAPSULE | Freq: Two times a day (BID) | ORAL | Status: DC
  Administered 2017-05-11: 100 mg via ORAL
  Filled 2017-05-11 (×2): qty 1

## 2017-05-11 MED ORDER — DEXTROSE 5 % IV SOLN
1.0000 g | Freq: Once | INTRAVENOUS | Status: DC
  Filled 2017-05-11: qty 10

## 2017-05-11 MED ORDER — VITAMIN B-1 100 MG PO TABS
100.0000 mg | ORAL_TABLET | Freq: Once | ORAL | Status: AC
  Administered 2017-05-11: 100 mg via ORAL
  Filled 2017-05-11: qty 1

## 2017-05-11 MED ORDER — FOLIC ACID 1 MG PO TABS
1.0000 mg | ORAL_TABLET | Freq: Once | ORAL | Status: AC
  Administered 2017-05-11: 1 mg via ORAL
  Filled 2017-05-11: qty 1

## 2017-05-11 MED ORDER — CEPHALEXIN 500 MG PO CAPS: 500.0000 mg | ORAL_CAPSULE | Freq: Three times a day (TID) | ORAL | 0 refills | Status: DC

## 2017-05-11 NOTE — ED Notes (Signed)
Patient transported to CT 

## 2017-05-11 NOTE — ED Provider Notes (Signed)
Susan Mccarthy DEPT Provider Note   CSN: [id number] Arrival date & time: 05/11/17  0025  By signing my name below, I, Susan Mccarthy, attest that this documentation has been prepared under the direction and in the presence of Susan Fuel, MD. Electronically Signed: Lise Mccarthy, ED Scribe. 05/11/17. 12:59 AM.  History   Chief Complaint Chief Complaint  Patient presents with  . Fall  LEVEL V CAVEAT: HPI and ROS limited due to altered mental status.   HPI  HPI Comments: Susan Mccarthy is a 66 y.o. female with a PMHx of anxiety, alcohol abuse, brought in by ambulance, who presents to the Emergency Department s/p mechanical fall with pain all overall, she rates the severity of her pain a 10. Pt reports she has fell multiple times today and she admits to EtOH usage and notes she "had a little bit".   Past Medical History:  Diagnosis Date  . Anxiety   . Arthritis   . Depression   . History of chemotherapy    INTERFERON  . Insomnia   . Melanoma ([place]) 1980   RIGHT BACK  . Thyroid ca ([place])   . Thyroid disease    85 % BENIGN    Patient Active Problem List   Diagnosis Date Noted  . Anxiety   . Hypothyroid 05/04/2017  . Paranoia ([place])   . Generalized anxiety disorder 08/06/2016  . Panic disorder 08/06/2016  . Alcohol use disorder, severe, dependence ([place]) 06/25/2016  . Alcohol withdrawal ([place]) 06/25/2016  . Opioid use disorder, moderate, dependence ([place]) 06/25/2016  . Bipolar disorder, current episode mixed ([place]) 06/25/2016  . Bulimia nervosa 06/25/2016  . Hypothyroidism, postop 07/08/2014  . GERD (gastroesophageal reflux disease) 01/12/2013  . Cancer of thyroid ([place]) 12/19/2012  . Malignant melanoma, metastatic ([place]) 12/10/2012  . Arthritis, degenerative 12/10/2012    Past Surgical History:  Procedure Laterality Date  . AXILLARY NODE DISSECTION  08/02/11   right axillary,  . Excision of melanoma  1980  . PERCUTANEOUS PINNING Left 08/27/2013   Procedure: PERCUTANEOUS  PINNING EXTREMITY;  Surgeon: Rozanna Box, MD;  Location: [facility];  Service: Orthopedics;  Laterality: Left;  . RASH     ON FACE AND HANDS S/P CHEMOTHERAPY  . SACRO-ILIAC PINNING Bilateral 08/27/2013   Procedure: Dub Mikes;  Surgeon: Rozanna Box, MD;  Location: [facility];  Service: Orthopedics;  Laterality: Bilateral;  Handy Bed, OIC Cannulated Screws  . THYROIDECTOMY  1/14   cancer    OB History    Gravida Para Term Preterm AB Living   0             SAB TAB Ectopic Multiple Live Births                  Obstetric Comments   MENARCHE TENNAGER 10, NO HRT, BIRTH CONTROL PILLS 1 YEARS       Home Medications    Prior to Admission medications   Medication Sig Start Date End Date Taking? Authorizing Provider  Cholecalciferol (D3-1000) 1000 units capsule Take 1,000 Units by mouth daily.    [provider]  fexofenadine (ALLEGRA) 180 MG tablet Take 180 mg by mouth daily.    [provider]  ibuprofen (ADVIL,MOTRIN) 200 MG tablet Take 200-800 mg by mouth every 6 (six) hours as needed for headache, mild pain or moderate pain.     [provider]  levothyroxine (SYNTHROID, LEVOTHROID) 100 MCG tablet Take 1 tablet (100 mcg total) by mouth daily before breakfast. 05/05/17  Patrecia Pour, NP  loperamide (IMODIUM A-D) 2 MG tablet Take 2 mg by mouth 4 (four) times daily as needed for diarrhea or loose stools.    [provider]    Family History Family History  Problem Relation Age of Onset  . ALS Father     Social History Social History  Substance Use Topics  . Smoking status: Current Some Day Smoker    Packs/day: 0.25    Years: 5.00    Types: Cigarettes    Start date: 01/11/1974    Last attempt to quit: 06/07/1979  . Smokeless tobacco: Never Used     Comment: quit 35 years  . Alcohol use 0.0 oz/week     Comment: RARE OCCASIONAL      Allergies   Other   Review of Systems Review of Systems  Unable to perform ROS: Mental status  change   Physical Exam Updated Vital Signs LMP 10/24/2011   SpO2 98%   Physical Exam  Constitutional: She is oriented to person, place, and time. She appears well-developed and well-nourished.  HENT:  Head: Normocephalic and atraumatic.  Eyes: EOM are normal. Pupils are equal, round, and reactive to light.  Neck: Normal range of motion. Neck supple. No JVD present.  Cardiovascular: Normal rate, regular rhythm and normal heart sounds.   No murmur heard. Pulmonary/Chest: Effort normal and breath sounds normal. She has no wheezes. She has no rales. She exhibits no tenderness.  Abdominal: Soft. Bowel sounds are normal. She exhibits no distension and no mass. There is no tenderness.  Musculoskeletal: Normal range of motion. She exhibits no edema.  Complains of pain on passive ROM of right shoulder and both hips.   Lymphadenopathy:    She has no cervical adenopathy.  Neurological: She is alert and oriented to person, place, and time. No cranial nerve deficit. She exhibits normal muscle tone. Coordination normal.  Somnolent but arousable. Speech is moderately slurred. Moves all extremities equally.    Skin: Skin is warm and dry. No rash noted.  Nursing note and vitals reviewed.  ED Treatments / Results  DIAGNOSTIC STUDIES: Oxygen Saturation is 98% on RA, normal by my interpretation.   COORDINATION OF CARE: 12:55 AM-Discussed next steps with pt. Pt verbalized understanding and is agreeable with the plan.   Labs (all labs ordered are listed, but only abnormal results are displayed) Labs Reviewed - No data to display  EKG  EKG Interpretation None      Radiology Dg Chest 1 View  Result Date: 05/11/2017 CLINICAL DATA:  Post fall. EXAM: CHEST 1 VIEW COMPARISON:  Radiographs 05/02/2017.  Head CT 11/10/2015 FINDINGS: Unchanged heart size and mediastinal contours. Interstitial coarsening consistent with emphysema. No pneumothorax. No confluent airspace disease or pleural effusion.  Chronic rib deformities within nodular appearance, unchanged from prior exam. Remote right distal clavicle fracture. Surgical clips in the right axilla. IMPRESSION: No acute abnormality.  Remote bilateral rib fractures. Electronically Signed   By: Jeb Levering M.D.   On: 05/11/2017 01:54   Ct Head Wo Contrast  Addendum Date: 05/11/2017   ADDENDUM REPORT: 05/11/2017 02:16 ADDENDUM: Left posterior parietal scalp hematoma swelling. Electronically Signed   By: Ashley Royalty M.D.   On: 05/11/2017 02:16   Result Date: 05/11/2017 CLINICAL DATA:  None patient fell multiple times today. Admits to alcohol usage. EXAM: CT HEAD WITHOUT CONTRAST CT CERVICAL SPINE WITHOUT CONTRAST TECHNIQUE: Multidetector CT imaging of the head and cervical spine was performed following the standard protocol without intravenous contrast.  Multiplanar CT image reconstructions of the cervical spine were also generated. COMPARISON:  None. FINDINGS: CT HEAD FINDINGS Brain: Minimal small vessel ischemic disease of periventricular white matter. No acute intracranial hemorrhage, midline shift or edema. No extra-axial fluid collections. No intra-axial mass. No hydrocephalus. Vascular: No hyperdense vessels. Skull: No acute skull fracture. The bony calvarium is slightly osteopenic in appearance. Sinuses/Orbits: No acute finding. Other: None. CT CERVICAL SPINE FINDINGS Alignment: Slight reversal cervical lordosis at C4 which may be due to positioning or muscle spasm. Skull base and vertebrae: No skullbase fracture. No vertebral body fracture. The atlantodental interval is maintained. Soft tissues and spinal canal: No prevertebral fluid or swelling. No visible canal hematoma. Disc levels: Disc space narrowing C3 through C6 without significant central canal stenosis. Mild right C5-6 neural foraminal encroachment from uncovertebral joint spurring. Upper chest: Biapical subpleural areas of atelectasis and/or scarring. No thyroid mass. Other: None  IMPRESSION: 1. Minimal chronic appearing small vessel ischemic disease. No acute intracranial abnormality. 2. Cervical spondylosis with disc space narrowing C3 through C6. No acute osseous abnormality. Electronically Signed: By: Ashley Royalty M.D. On: 05/11/2017 02:07   Ct Cervical Spine Wo Contrast  Addendum Date: 05/11/2017   ADDENDUM REPORT: 05/11/2017 02:16 ADDENDUM: Left posterior parietal scalp hematoma swelling. Electronically Signed   By: Ashley Royalty M.D.   On: 05/11/2017 02:16   Result Date: 05/11/2017 CLINICAL DATA:  None patient fell multiple times today. Admits to alcohol usage. EXAM: CT HEAD WITHOUT CONTRAST CT CERVICAL SPINE WITHOUT CONTRAST TECHNIQUE: Multidetector CT imaging of the head and cervical spine was performed following the standard protocol without intravenous contrast. Multiplanar CT image reconstructions of the cervical spine were also generated. COMPARISON:  None. FINDINGS: CT HEAD FINDINGS Brain: Minimal small vessel ischemic disease of periventricular white matter. No acute intracranial hemorrhage, midline shift or edema. No extra-axial fluid collections. No intra-axial mass. No hydrocephalus. Vascular: No hyperdense vessels. Skull: No acute skull fracture. The bony calvarium is slightly osteopenic in appearance. Sinuses/Orbits: No acute finding. Other: None. CT CERVICAL SPINE FINDINGS Alignment: Slight reversal cervical lordosis at C4 which may be due to positioning or muscle spasm. Skull base and vertebrae: No skullbase fracture. No vertebral body fracture. The atlantodental interval is maintained. Soft tissues and spinal canal: No prevertebral fluid or swelling. No visible canal hematoma. Disc levels: Disc space narrowing C3 through C6 without significant central canal stenosis. Mild right C5-6 neural foraminal encroachment from uncovertebral joint spurring. Upper chest: Biapical subpleural areas of atelectasis and/or scarring. No thyroid mass. Other: None IMPRESSION: 1. Minimal  chronic appearing small vessel ischemic disease. No acute intracranial abnormality. 2. Cervical spondylosis with disc space narrowing C3 through C6. No acute osseous abnormality. Electronically Signed: By: Ashley Royalty M.D. On: 05/11/2017 02:07   Dg Shoulder Left  Result Date: 05/11/2017 CLINICAL DATA:  Fall with left shoulder pain. EXAM: LEFT SHOULDER - 2+ VIEW COMPARISON:  Radiograph 06/13/2016 FINDINGS: No acute fracture or dislocation. Moderate glenohumeral osteoarthritis. Acromioclavicular joint is congruent. Hill-Sachs defect on prior exam not as well visualized due to positioning. IMPRESSION: No acute fracture or dislocation. Moderate glenohumeral osteoarthritis. Electronically Signed   By: Jeb Levering M.D.   On: 05/11/2017 01:48   Dg Hips Bilat W Or Wo Pelvis 5 Views  Result Date: 05/11/2017 CLINICAL DATA:  Post fall with pelvic pain. EXAM: DG HIP (WITH OR WITHOUT PELVIS) 5+V BILAT COMPARISON:  None. FINDINGS: Bilateral sacroiliac joint pinning. Sacroiliac joints are normally aligned. No pubic symphyseal widening. Cortical margins of the bony  pelvis and both hips are intact. No evidence of acute fracture. Diffuse bony under mineralization. IMPRESSION: No acute pelvic or hip fracture. Bilateral sacroiliac joint pinning. Electronically Signed   By: Jeb Levering M.D.   On: 05/11/2017 01:50    Procedures Procedures (including critical care time)  Medications Ordered in ED Medications  nitrofurantoin (macrocrystal-monohydrate) (MACROBID) capsule 100 mg (not administered)  LORazepam (ATIVAN) injection 1 mg (not administered)  thiamine (VITAMIN B-1) tablet 100 mg (not administered)  folic acid (FOLVITE) tablet 1 mg (not administered)  nitrofurantoin (macrocrystal-monohydrate) (MACROBID) capsule 100 mg (100 mg Oral Given 05/11/17 0605)     Initial Impression / Assessment and Plan / ED Course  I have reviewed the triage vital signs and the nursing notes.  Pertinent labs & imaging  results that were available during my care of the patient were reviewed by me and considered in my medical decision making (see chart for details).  Patient presents with multiple falls, altered mentation. Old records are reviewed, and she has no relevant visits. She sent for CT of head and screening labs are obtained. Ethanol level is borderline at 79, she has hypernatremia which is not significant changed from baseline, and borderline hypokalemia. Urinalysis is significant for positive nitrite. Specimen sent for culture and she is given a dose of ceftriaxone. CT scans and x-rays show no acute process. I went back to reevaluate her, and she is more awake and coherent and states that she has fallen every time she turns around in her home. No other focal findings to suggest stroke, but will send for MRI to rule out posterior fossa stroke. She is also noted to have a 1 g drop in hemoglobin from several days ago.  Patient pulled out her IV before getting ceftriaxone. She is given oral nitrofurantoin. She is also being given lorazepam for sedation for MRI scan. Case is signed out to Dr. Venora Maples.  Final Clinical Impressions(s) / ED Diagnoses   Final diagnoses:  Falls, initial encounter  Alcohol intoxication, uncomplicated (HCC)  Hyponatremia  Normochromic normocytic anemia  Urinary tract infection without hematuria, site unspecified  Elevated AST (SGOT)    New Prescriptions New Prescriptions   No medications on file   I personally performed the services described in this documentation, which was scribed in my presence. The recorded information has been reviewed and is accurate.      Susan Fuel, MD 97/02/63 [phone]

## 2017-05-11 NOTE — ED Provider Notes (Addendum)
10:14 AM Patient feels much better this time.  MRI negative for acute infarct.  Patient be started on antibiotics for urinary tract infection.  Urine culture sent.  Patient would prefer to go home.  She is ambulatory in the ER.  Discharge home in good condition.  Mr Brain 70 Contrast  Result Date: 05/11/2017 CLINICAL DATA:  66 year old female with confusion, altered mental status. EXAM: MRI HEAD WITHOUT CONTRAST TECHNIQUE: Multiplanar, multiecho pulse sequences of the brain and surrounding structures were obtained without intravenous contrast. COMPARISON:  Head CT 05/11/2017.  Brain MRI 11/06/2011. FINDINGS: Despite premedication the examination had to be discontinued prior to completion due to patient agitation. Axial in coronal weighted diffusion imaging plus sagittal T1 and axial T2 weighted imaging only was obtained. Brain: No restricted diffusion to suggest acute infarction. No midline shift, mass effect, evidence of mass lesion, ventriculomegaly, extra-axial collection or acute intracranial hemorrhage. Cervicomedullary junction and pituitary are within normal limits. Patchy and confluent periventricular white matter T2 hyperintensity has progressed since 2012. No cortical encephalomalacia identified. Deep gray matter nuclei, brainstem, and cerebellum appear to remain normal for age. Vascular: Major intracranial vascular flow voids appear stable since 2012. Skull and upper cervical spine: Negative. Normal bone marrow signal. Sinuses/Orbits: Negative. Other: Left posterior convexity scalp hematoma re- demonstrated (series 6, image 13). IMPRESSION: 1.  The examination had to be discontinued prior to completion. 2. Negative for acute infarct. Progressed nonspecific cerebral white matter signal changes since 2012. 3. Left posterior convexity scalp hematoma. Electronically Signed   By: Genevie Ann M.D.   On: 05/11/2017 09:58       Jola Schmidt, MD 05/11/17 [place]    Jola Schmidt, MD 05/11/17 [phone]
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
## 2017-05-11 NOTE — ED Notes (Signed)
Waiting for MRI

## 2017-05-11 NOTE — ED Notes (Signed)
Pt. Preferred medicine to be mixed with apple sauce but spit it out soon as it was given to her and told this Nurse she doesn't like the taste of the apple sauce with the medicine in it." Dr. Roxanne Mins notified.

## 2017-05-13 ENCOUNTER — Emergency Department (HOSPITAL_COMMUNITY)
Admission: EM | Admit: 2017-05-13 | Discharge: 2017-05-14 | Disposition: A | Discharge status: Home / Self Care | Payer: BLUE CROSS/BLUE SHIELD | Attending: Emergency Medicine | Admitting: Emergency Medicine

## 2017-05-13 ENCOUNTER — Encounter (HOSPITAL_COMMUNITY): Payer: Self-pay | Admitting: Emergency Medicine

## 2017-05-13 DIAGNOSIS — F418 Other specified anxiety disorders: Secondary | ICD-10-CM | POA: Insufficient documentation

## 2017-05-13 DIAGNOSIS — F1010 Alcohol abuse, uncomplicated: Secondary | ICD-10-CM | POA: Insufficient documentation

## 2017-05-13 DIAGNOSIS — F17210 Nicotine dependence, cigarettes, uncomplicated: Secondary | ICD-10-CM | POA: Insufficient documentation

## 2017-05-13 DIAGNOSIS — Z79899 Other long term (current) drug therapy: Secondary | ICD-10-CM | POA: Insufficient documentation

## 2017-05-13 DIAGNOSIS — F419 Anxiety disorder, unspecified: Secondary | ICD-10-CM

## 2017-05-13 LAB — URINE CULTURE: Culture: >=100000 — AB

## 2017-05-13 NOTE — ED Notes (Signed)
Bed: FY10 Expected date:  Expected time:  Means of arrival:  Comments: 66 yo F  Mental health eval
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
## 2017-05-13 NOTE — ED Notes (Signed)
Pt called for this RN and she began shifting herself from side to side in the bed moaning saying she was in so much pain. Pt states she is hurting all over and has stage 2 thyroid cancer.

## 2017-05-14 ENCOUNTER — Telehealth: Payer: Self-pay | Admitting: Emergency Medicine

## 2017-05-14 NOTE — ED Notes (Signed)
Pt ambulatory and independent at discharge.  Verbalized understanding of discharge instructions 
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
## 2017-05-14 NOTE — Telephone Encounter (Signed)
Post ED Visit - Positive Culture Follow-up  Culture report reviewed by antimicrobial stewardship pharmacist:  []  Elenor Quinones, Pharm.D. []  Heide Guile, Pharm.D., BCPS AQ-ID []  Parks Neptune, Pharm.D., BCPS [x]  Alycia Rossetti, Pharm.D., BCPS []  Sebastian, Pharm.D., BCPS, AAHIVP []  Legrand Como, Pharm.D., BCPS, AAHIVP []  Salome Arnt, PharmD, BCPS []  Dimitri Ped, PharmD, BCPS []  Vincenza Hews, PharmD, BCPS  Positive urine culture Treated with cephalexin, organism sensitive to the same and no further patient follow-up is required at this time.  Hazle Nordmann 05/14/2017, 10:53 AM

## 2017-05-14 NOTE — ED Notes (Signed)
ED Provider at bedside. 

## 2017-05-14 NOTE — ED Notes (Signed)
Pt able to ambulate without assistance.  

## 2017-05-18 ENCOUNTER — Encounter (HOSPITAL_COMMUNITY): Payer: Self-pay | Admitting: Emergency Medicine

## 2017-05-18 ENCOUNTER — Emergency Department (HOSPITAL_COMMUNITY)
Admission: EM | Admit: 2017-05-18 | Discharge: 2017-05-18 | Disposition: A | Discharge status: Home / Self Care | Payer: BLUE CROSS/BLUE SHIELD | Attending: Emergency Medicine | Admitting: Emergency Medicine

## 2017-05-18 DIAGNOSIS — Z85850 Personal history of malignant neoplasm of thyroid: Secondary | ICD-10-CM | POA: Diagnosis not present

## 2017-05-18 DIAGNOSIS — Z79899 Other long term (current) drug therapy: Secondary | ICD-10-CM | POA: Insufficient documentation

## 2017-05-18 DIAGNOSIS — R079 Chest pain, unspecified: Secondary | ICD-10-CM | POA: Diagnosis present

## 2017-05-18 DIAGNOSIS — F419 Anxiety disorder, unspecified: Secondary | ICD-10-CM

## 2017-05-18 DIAGNOSIS — F17210 Nicotine dependence, cigarettes, uncomplicated: Secondary | ICD-10-CM | POA: Insufficient documentation

## 2017-05-18 DIAGNOSIS — C439 Malignant melanoma of skin, unspecified: Secondary | ICD-10-CM | POA: Insufficient documentation

## 2017-05-18 NOTE — ED Notes (Signed)
[facility] called to transport pt 

## 2017-05-18 NOTE — Care Management Note (Signed)
Case Management Note  Patient Details  Name: Susan Mccarthy MRN: [id number] Date of Birth: 1951/03/19  Subjective/Objective:      Chest pain              Action/Plan: Discharge Planning:NCM spoke to pt at bedside. Offered choice for [facility]. Pt reports having RW and cane at home. States she lives at home alone. Offered choice for [facility]. Pt agreeable to [facility]. Contacted [facility] for [facility].   PCP Cassandria Anger MD  Expected Discharge Date:  05/18/2017               Expected Discharge Plan:  [place]  In-House Referral:  NA  Discharge planning Services  CM Consult  Post Acute Care Choice:  Home Health Choice offered to:  Patient  DME Arranged:  N/A DME Agency:  NA  HH Arranged:  RN, PT, Nurse's Aide, Social Work [employer] Agency:  [facility] [facility] [employer]  Status of Service:  Completed, signed off  If discussed at [employer], dates discussed:    Additional Comments:  Erenest Rasher, RN 05/18/2017, 4:59 PM

## 2017-05-18 NOTE — ED Triage Notes (Signed)
She c/o pain at left sterno-clavicular joint area, at which she has an area of edema and ecchymosis and tell us she fell and struck this area a few days ago. She also tells [place] she is currently being treated for u.t.i. She is in no distress.

## 2017-05-18 NOTE — Discharge Instructions (Signed)
There were no problems found with your heart today.  To help your chest discomfort, use heat on the sore area 3 or 4 times a day, and take Tylenol.  Follow-up with a psychiatrist as soon as possible.  Also, try to see a primary care doctor.  Use the resource guides, to help you find treatment.
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
## 2017-05-18 NOTE — ED Notes (Signed)
Pt given taxi voucher and discharged via [employer].

## 2017-05-18 NOTE — ED Notes (Signed)
[facility] staff informed ED staff that pt's current residence is [address] [place], [place] [address]

## 2017-05-18 NOTE — ED Notes (Signed)
AC at bedside to speak with pt.
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
## 2017-05-24 ENCOUNTER — Telehealth: Payer: Self-pay | Admitting: Internal Medicine

## 2017-05-24 NOTE — Telephone Encounter (Signed)
FYI

## 2017-05-24 NOTE — Telephone Encounter (Signed)
Home health services will start on 6/25

## 2017-05-27 NOTE — Telephone Encounter (Signed)
The pt was dismissed from our practice. Pls let HH know Thx

## 2017-05-28 NOTE — Telephone Encounter (Signed)
[facility] notified and Plotnikov removed from pts chart as PCP

## 2017-06-13 ENCOUNTER — Encounter (HOSPITAL_COMMUNITY): Payer: Self-pay | Admitting: Obstetrics and Gynecology

## 2017-06-13 ENCOUNTER — Inpatient Hospital Stay (HOSPITAL_COMMUNITY)
Admission: EM | Admit: 2017-06-13 | Discharge: 2017-06-18 | DRG: 683 | Disposition: A | Discharge status: Home Health | Payer: BLUE CROSS/BLUE SHIELD | Attending: Family Medicine | Admitting: Family Medicine

## 2017-06-13 DIAGNOSIS — E876 Hypokalemia: Secondary | ICD-10-CM | POA: Diagnosis present

## 2017-06-13 DIAGNOSIS — N179 Acute kidney failure, unspecified: Secondary | ICD-10-CM | POA: Diagnosis not present

## 2017-06-13 DIAGNOSIS — F10220 Alcohol dependence with intoxication, uncomplicated: Secondary | ICD-10-CM | POA: Diagnosis present

## 2017-06-13 DIAGNOSIS — Y907 Blood alcohol level of 200-239 mg/100 ml: Secondary | ICD-10-CM | POA: Diagnosis present

## 2017-06-13 DIAGNOSIS — R441 Visual hallucinations: Secondary | ICD-10-CM | POA: Diagnosis not present

## 2017-06-13 DIAGNOSIS — F17210 Nicotine dependence, cigarettes, uncomplicated: Secondary | ICD-10-CM | POA: Diagnosis present

## 2017-06-13 DIAGNOSIS — F319 Bipolar disorder, unspecified: Secondary | ICD-10-CM | POA: Diagnosis present

## 2017-06-13 DIAGNOSIS — K219 Gastro-esophageal reflux disease without esophagitis: Secondary | ICD-10-CM | POA: Diagnosis present

## 2017-06-13 DIAGNOSIS — C7800 Secondary malignant neoplasm of unspecified lung: Secondary | ICD-10-CM

## 2017-06-13 DIAGNOSIS — I959 Hypotension, unspecified: Secondary | ICD-10-CM | POA: Diagnosis present

## 2017-06-13 DIAGNOSIS — F10920 Alcohol use, unspecified with intoxication, uncomplicated: Secondary | ICD-10-CM

## 2017-06-13 DIAGNOSIS — E860 Dehydration: Secondary | ICD-10-CM | POA: Diagnosis present

## 2017-06-13 DIAGNOSIS — Z85820 Personal history of malignant melanoma of skin: Secondary | ICD-10-CM

## 2017-06-13 DIAGNOSIS — D649 Anemia, unspecified: Secondary | ICD-10-CM | POA: Diagnosis present

## 2017-06-13 DIAGNOSIS — E890 Postprocedural hypothyroidism: Secondary | ICD-10-CM | POA: Diagnosis present

## 2017-06-13 DIAGNOSIS — F419 Anxiety disorder, unspecified: Secondary | ICD-10-CM | POA: Diagnosis present

## 2017-06-13 DIAGNOSIS — F1020 Alcohol dependence, uncomplicated: Secondary | ICD-10-CM | POA: Diagnosis present

## 2017-06-13 DIAGNOSIS — N390 Urinary tract infection, site not specified: Secondary | ICD-10-CM | POA: Diagnosis present

## 2017-06-13 DIAGNOSIS — Z79899 Other long term (current) drug therapy: Secondary | ICD-10-CM

## 2017-06-13 DIAGNOSIS — Z85850 Personal history of malignant neoplasm of thyroid: Secondary | ICD-10-CM

## 2017-06-13 DIAGNOSIS — E871 Hypo-osmolality and hyponatremia: Secondary | ICD-10-CM | POA: Diagnosis present

## 2017-06-13 LAB — CBC WITH DIFFERENTIAL/PLATELET
Basophils Absolute: 0 10*3/uL (ref 0.0–0.1)
Basophils Relative: 1 %
Eosinophils Absolute: 0.1 10*3/uL (ref 0.0–0.7)
Eosinophils Relative: 2 %
HCT: 27.9 % — ABNORMAL LOW (ref 36.0–46.0)
HEMOGLOBIN: 9.9 g/dL — AB (ref 12.0–15.0)
LYMPHS ABS: 1.2 10*3/uL (ref 0.7–4.0)
LYMPHS PCT: 26 %
MCH: 33.4 pg (ref 26.0–34.0)
MCHC: 35.5 g/dL (ref 30.0–36.0)
MCV: 94.3 fL (ref 78.0–100.0)
Monocytes Absolute: 0.3 10*3/uL (ref 0.1–1.0)
Monocytes Relative: 7 %
NEUTROS PCT: 64 %
Neutro Abs: 2.9 10*3/uL (ref 1.7–7.7)
Platelets: 209 10*3/uL (ref 150–400)
RBC: 2.96 MIL/uL — AB (ref 3.87–5.11)
RDW: 15 % (ref 11.5–15.5)
WBC: 4.5 10*3/uL (ref 4.0–10.5)

## 2017-06-13 LAB — ETHANOL: Alcohol, Ethyl (B): 206 mg/dL — ABNORMAL HIGH (ref <5)

## 2017-06-13 LAB — CBG MONITORING, ED: Glucose-Capillary: 96 mg/dL (ref 65–99)

## 2017-06-13 MED ORDER — SODIUM CHLORIDE 0.9 % IV BOLUS (SEPSIS)
1000.0000 mL | Freq: Once | INTRAVENOUS | Status: AC
  Administered 2017-06-13: 1000 mL via INTRAVENOUS

## 2017-06-13 MED ORDER — SODIUM CHLORIDE 0.9 % IV BOLUS (SEPSIS): 1000.0000 mL | Freq: Once | INTRAVENOUS | Status: DC

## 2017-06-13 NOTE — ED Provider Notes (Signed)
[facility] DEPT Provider Note   CSN: [id number] Arrival date & time: 06/13/17  2153     History   Chief Complaint Chief Complaint  Patient presents with  . Generalized Body Aches  . Alcohol Intoxication    HPI Susan Mccarthy is a 66 y.o. female.   The history is provided by the patient and medical records. The history is limited by the condition of the patient.  Alcohol Intoxication  This is a new problem. The current episode started 12 to 24 hours ago. The problem occurs constantly. The problem has not changed since onset.Pertinent negatives include no chest pain, no abdominal pain, no headaches and no shortness of breath. Nothing aggravates the symptoms. Nothing relieves the symptoms. She has tried nothing for the symptoms. The treatment provided no relief.    Past Medical History:  Diagnosis Date  . Anxiety   . Arthritis   . Depression   . History of chemotherapy    INTERFERON  . Insomnia   . Melanoma ([place]) 1980   RIGHT BACK  . Thyroid ca ([place])   . Thyroid disease    85 % BENIGN    Patient Active Problem List   Diagnosis Date Noted  . Anxiety   . Hypothyroid 05/04/2017  . Paranoia ([place])   . Generalized anxiety disorder 08/06/2016  . Panic disorder 08/06/2016  . Alcohol use disorder, severe, dependence ([place]) 06/25/2016  . Alcohol withdrawal ([place]) 06/25/2016  . Opioid use disorder, moderate, dependence ([place]) 06/25/2016  . Bipolar disorder, current episode mixed ([place]) 06/25/2016  . Bulimia nervosa 06/25/2016  . Hypothyroidism, postop 07/08/2014  . GERD (gastroesophageal reflux disease) 01/12/2013  . Cancer of thyroid ([place]) 12/19/2012  . Malignant melanoma, metastatic ([place]) 12/10/2012  . Arthritis, degenerative 12/10/2012    Past Surgical History:  Procedure Laterality Date  . AXILLARY NODE DISSECTION  08/02/11   right axillary,  . Excision of melanoma  1980  . PERCUTANEOUS PINNING Left 08/27/2013   Procedure: PERCUTANEOUS PINNING EXTREMITY;   Surgeon: Rozanna Box, MD;  Location: [facility];  Service: Orthopedics;  Laterality: Left;  . RASH     ON FACE AND HANDS S/P CHEMOTHERAPY  . SACRO-ILIAC PINNING Bilateral 08/27/2013   Procedure: Dub Mikes;  Surgeon: Rozanna Box, MD;  Location: [facility];  Service: Orthopedics;  Laterality: Bilateral;  Handy Bed, OIC Cannulated Screws  . THYROIDECTOMY  1/14   cancer    OB History    Gravida Para Term Preterm AB Living   0             SAB TAB Ectopic Multiple Live Births                  Obstetric Comments   MENARCHE TENNAGER 10, NO HRT, BIRTH CONTROL PILLS 2 YEARS       Home Medications    Prior to Admission medications   Medication Sig Start Date End Date Taking? Authorizing Provider  cephALEXin (KEFLEX) 500 MG capsule Take 1 capsule (500 mg total) by mouth 3 (three) times daily. 05/11/17   Jola Schmidt, MD  cholecalciferol (VITAMIN D) 1000 units tablet Take 1,000 Units by mouth daily.    [provider]  fexofenadine-pseudoephedrine (ALLEGRA-D 24) 180-240 MG 24 hr tablet Take 1 tablet by mouth daily.    [provider]  ibuprofen (ADVIL,MOTRIN) 200 MG tablet Take 200-800 mg by mouth every 6 (six) hours as needed for fever, headache, mild pain or moderate pain.     [provider]  levothyroxine (SYNTHROID, LEVOTHROID) 100 MCG tablet Take 1 tablet (100 mcg total) by mouth daily before breakfast. 05/05/17   Patrecia Pour, NP    Family History Family History  Problem Relation Age of Onset  . ALS Father     Social History Social History  Substance Use Topics  . Smoking status: Current Some Day Smoker    Packs/day: 0.25    Years: 5.00    Types: Cigarettes    Start date: 01/11/1974    Last attempt to quit: 06/07/1979  . Smokeless tobacco: Never Used     Comment: quit 35 years  . Alcohol use 0.0 oz/week     Comment: RARE OCCASIONAL      Allergies   Other   Review of Systems Review of Systems  Constitutional: Positive for fatigue.  Negative for chills, diaphoresis and fever.  HENT: Negative for congestion and rhinorrhea.   Respiratory: Negative for chest tightness, shortness of breath, wheezing and stridor.   Cardiovascular: Negative for chest pain.  Gastrointestinal: Negative for abdominal pain, diarrhea, nausea and vomiting.  Genitourinary: Negative for dysuria.  Musculoskeletal: Positive for back pain (chronic low). Negative for neck pain and neck stiffness.  Skin: Negative for rash and wound.  Neurological: Negative for syncope, light-headedness and headaches.  Psychiatric/Behavioral: Positive for agitation.  All other systems reviewed and are negative.    Physical Exam Updated Vital Signs BP (!) 80/53 (BP Location: Right Arm)   Pulse 69   Temp 98.2 F (36.8 C) (Oral)   Resp 18   Ht 5\' 4"  (1.626 m)   Wt 52.2 kg (115 lb)   LMP 10/24/2011   SpO2 96%   BMI 19.74 kg/m   Physical Exam  Constitutional: She appears well-developed and well-nourished. No distress.  HENT:  Head: Normocephalic and atraumatic.  Mouth/Throat: Oropharynx is clear and moist. No oropharyngeal exudate.  Eyes: Pupils are equal, round, and reactive to light. Conjunctivae are normal.  Neck: Neck supple.  Cardiovascular: Normal rate and intact distal pulses.   No murmur heard. Pulmonary/Chest: Effort normal and breath sounds normal. No stridor. No respiratory distress. She has no wheezes. She exhibits no tenderness.  Abdominal: Soft. There is no tenderness.  Musculoskeletal: She exhibits no edema.  Neurological: She is alert. No sensory deficit. She exhibits normal muscle tone.  Skin: Skin is warm and dry. Capillary refill takes less than 2 seconds. No rash noted.  Psychiatric: Her mood appears not anxious. Her speech is slurred. She is agitated. She is not actively hallucinating. Thought content is not paranoid and not delusional. She expresses no homicidal and no suicidal ideation.  Patient is clinically intoxicated.  Nursing note  and vitals reviewed.    ED Treatments / Results  Labs (all labs ordered are listed, but only abnormal results are displayed) Labs Reviewed  CBC WITH DIFFERENTIAL/PLATELET - Abnormal; Notable for the following:       Result Value   RBC 2.96 (*)    Hemoglobin 9.9 (*)    HCT 27.9 (*)    All other components within normal limits  ETHANOL - Abnormal; Notable for the following:    Alcohol, Ethyl (B) 206 (*)    All other components within normal limits  RAPID URINE DRUG SCREEN, HOSP PERFORMED  URINALYSIS, ROUTINE W REFLEX MICROSCOPIC  COMPREHENSIVE METABOLIC PANEL  LIPASE, BLOOD  CBG MONITORING, ED    EKG  EKG Interpretation None       Radiology No results found.  Procedures Procedures (including critical care time)  Medications Ordered in ED Medications  sodium chloride 0.9 % bolus 1,000 mL (1,000 mLs Intravenous New Bag/Given 06/13/17 2320)  sodium chloride 0.9 % bolus 1,000 mL (1,000 mLs Intravenous New Bag/Given 06/13/17 2321)     Initial Impression / Assessment and Plan / ED Course  I have reviewed the triage vital signs and the nursing notes.  Pertinent labs & imaging results that were available during my care of the patient were reviewed by me and considered in my medical decision making (see chart for details).     Susan Mccarthy is a 66 y.o. female with a past medical history significant for GERD, hypothyroidism, cancer, alcohol abuse, and generalized anxiety who presents with intoxication and generalized aching. Patient presents intoxicated by EMS. Patient says that she "drank all day". She says that she drank "way too much". Patient reports that she is having anxiety and depression for her living situation. She says that she sometimes does not feel safe but did not elaborate why. Patient denies any chest pain, shortness of breath, nausea, vomiting, conservation, diarrhea, or dysuria. She reports generalized aching and pain and previously injured areas including  her low back and her left foot. She denies any falls today.  Patient denies any suicidal ideation or homicidal ideation. She reports her intoxication was not an attempt to hurt herself.  Patient reports that she wants to be placed in a safe place.  History and exam are seen above. On exam, patient is clinically intoxicated. Patient initially perseverated on where her shoes were and then her service chihuahua's location. Patient is moving all extremities. Patient is alert and oriented. Patient is tearful and discussing her feelings. Patient's lungs were clear and chest was nontender. No focal neurologic deficits seen.  Given patient's clear intoxication, patient will be allowed to metabolize her intoxication. Patient to be given oral food and fluids for rehydration. Given her lack of complaints or infectious symptoms, will hold on invasive testing at this time. Patient denies new falls or head injury. We'll hold on head CT.  As patient does not appear to be a danger to herself or others at this time, do not feel patient needs psychiatric involuntary commitment but do feel the patient needs to become clinically sober prior to discharge.         10:56 PM Patient had blood pressure repeated and was still soft. Patient will have screening laboratory testing performed and will be given IV fluids for rehydration.  Anticipate reassessment following metabolism of alcohol.   12:46 AM Patient's now receiving fluids. Blood pressure was repeated and was 94 systolic. Patient says that she frequently has low blood pressures however, this will be trended as she gets fluids. Hemoglobin was found to be 9.9 which is decreased from prior. Patient informed that we needed to check to look for occult GI bleed however she adamantly yelled and refused a rectal exam. Patient refused Hemoccult testing.  Care will be transferred to Dr. Roxanne Mins for reassessment and further management after metabolism of alcohol. Patient is  still awaiting metabolic panel results. Next  Anticipate reassessment after metabolism to determine disposition.    Final Clinical Impressions(s) / ED Diagnoses   Final diagnoses:  Alcoholic intoxication without complication (HCC)    Clinical Impression: 1. Alcoholic intoxication without complication ([place])     Disposition: Care transferred to Dr. Roxanne Mins while awaiting metabolism of alcohol and reassessment.     Shawntrice Salle, Gwenyth Allegra, MD 06/14/17 [id number]
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
## 2017-06-13 NOTE — ED Notes (Signed)
Spoke with lab, light green grossly hemolyzed, needs to be recollected, RN notified
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
## 2017-06-13 NOTE — ED Notes (Signed)
ED Provider at bedside. 
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
## 2017-06-13 NOTE — ED Notes (Signed)
Bed: WA20 Expected date:  Expected time:  Means of arrival:  Comments: 65 yr old SI, AMS, ETOH

## 2017-06-14 DIAGNOSIS — D649 Anemia, unspecified: Secondary | ICD-10-CM | POA: Diagnosis present

## 2017-06-14 DIAGNOSIS — N179 Acute kidney failure, unspecified: Secondary | ICD-10-CM | POA: Diagnosis not present

## 2017-06-14 DIAGNOSIS — F10920 Alcohol use, unspecified with intoxication, uncomplicated: Secondary | ICD-10-CM | POA: Diagnosis not present

## 2017-06-14 DIAGNOSIS — E890 Postprocedural hypothyroidism: Secondary | ICD-10-CM | POA: Diagnosis not present

## 2017-06-14 DIAGNOSIS — E876 Hypokalemia: Secondary | ICD-10-CM | POA: Diagnosis not present

## 2017-06-14 DIAGNOSIS — F1020 Alcohol dependence, uncomplicated: Secondary | ICD-10-CM | POA: Diagnosis not present

## 2017-06-14 DIAGNOSIS — E871 Hypo-osmolality and hyponatremia: Secondary | ICD-10-CM | POA: Diagnosis present

## 2017-06-14 DIAGNOSIS — K219 Gastro-esophageal reflux disease without esophagitis: Secondary | ICD-10-CM | POA: Diagnosis not present

## 2017-06-14 DIAGNOSIS — I959 Hypotension, unspecified: Secondary | ICD-10-CM | POA: Diagnosis present

## 2017-06-14 LAB — COMPREHENSIVE METABOLIC PANEL
ALT: 18 U/L (ref 14–54)
AST: 55 U/L — ABNORMAL HIGH (ref 15–41)
Albumin: 4 g/dL (ref 3.5–5.0)
Alkaline Phosphatase: 128 U/L — ABNORMAL HIGH (ref 38–126)
Anion gap: 13 (ref 5–15)
BUN: 18 mg/dL (ref 6–20)
CALCIUM: 8 mg/dL — AB (ref 8.9–10.3)
CO2: 23 mmol/L (ref 22–32)
Chloride: 97 mmol/L — ABNORMAL LOW (ref 101–111)
Creatinine, Ser: 1.49 mg/dL — ABNORMAL HIGH (ref 0.44–1.00)
GFR calc non Af Amer: 35 mL/min — ABNORMAL LOW (ref >60)
GFR, EST AFRICAN AMERICAN: 40 mL/min — AB (ref >60)
Glucose, Bld: 71 mg/dL (ref 65–99)
Potassium: 2.9 mmol/L — ABNORMAL LOW (ref 3.5–5.1)
SODIUM: 133 mmol/L — AB (ref 135–145)
Total Bilirubin: 0.7 mg/dL (ref 0.3–1.2)
Total Protein: 6.6 g/dL (ref 6.5–8.1)

## 2017-06-14 LAB — GLUCOSE, CAPILLARY: Glucose-Capillary: 71 mg/dL (ref 65–99)

## 2017-06-14 LAB — CBC
HCT: 31.9 % — ABNORMAL LOW (ref 36.0–46.0)
Hemoglobin: 11 g/dL — ABNORMAL LOW (ref 12.0–15.0)
MCH: 32.4 pg (ref 26.0–34.0)
MCHC: 34.5 g/dL (ref 30.0–36.0)
MCV: 94.1 fL (ref 78.0–100.0)
PLATELETS: 198 10*3/uL (ref 150–400)
RBC: 3.39 MIL/uL — AB (ref 3.87–5.11)
RDW: 14.7 % (ref 11.5–15.5)
WBC: 4.9 10*3/uL (ref 4.0–10.5)

## 2017-06-14 LAB — BASIC METABOLIC PANEL
Anion gap: 13 (ref 5–15)
BUN: 15 mg/dL (ref 6–20)
CO2: 22 mmol/L (ref 22–32)
CREATININE: 1.02 mg/dL — AB (ref 0.44–1.00)
Calcium: 8.2 mg/dL — ABNORMAL LOW (ref 8.9–10.3)
Chloride: 100 mmol/L — ABNORMAL LOW (ref 101–111)
GFR, EST NON AFRICAN AMERICAN: 55 mL/min — AB (ref >60)
Glucose, Bld: 69 mg/dL (ref 65–99)
Potassium: 3.3 mmol/L — ABNORMAL LOW (ref 3.5–5.1)
SODIUM: 135 mmol/L (ref 135–145)

## 2017-06-14 LAB — T4, FREE

## 2017-06-14 LAB — LIPASE, BLOOD: Lipase: 29 U/L (ref 11–51)

## 2017-06-14 LAB — TSH: TSH: 86.56 u[IU]/mL — AB (ref 0.350–4.500)

## 2017-06-14 LAB — CORTISOL-AM, BLOOD: CORTISOL - AM: 23.6 ug/dL — AB (ref 6.7–22.6)

## 2017-06-14 LAB — MAGNESIUM: Magnesium: 2.1 mg/dL (ref 1.7–2.4)

## 2017-06-14 MED ORDER — VITAMIN D3 25 MCG (1000 UNIT) PO TABS
1000.0000 [IU] | ORAL_TABLET | Freq: Every day | ORAL | Status: DC
  Administered 2017-06-14 – 2017-06-18 (×5): 1000 [IU] via ORAL
  Filled 2017-06-14 (×5): qty 1

## 2017-06-14 MED ORDER — POTASSIUM CHLORIDE 10 MEQ/100ML IV SOLN: 10.0000 meq | Freq: Once | INTRAVENOUS | Status: DC

## 2017-06-14 MED ORDER — VITAMIN B-1 100 MG PO TABS
100.0000 mg | ORAL_TABLET | Freq: Every day | ORAL | Status: DC
  Administered 2017-06-14 – 2017-06-18 (×5): 100 mg via ORAL
  Filled 2017-06-14 (×5): qty 1

## 2017-06-14 MED ORDER — NICOTINE 21 MG/24HR TD PT24
21.0000 mg | MEDICATED_PATCH | Freq: Every day | TRANSDERMAL | Status: DC
  Administered 2017-06-14 – 2017-06-18 (×5): 21 mg via TRANSDERMAL
  Filled 2017-06-14 (×5): qty 1

## 2017-06-14 MED ORDER — LORAZEPAM 2 MG/ML IJ SOLN
0.0000 mg | Freq: Four times a day (QID) | INTRAMUSCULAR | Status: DC
  Administered 2017-06-15 (×2): 2 mg via INTRAVENOUS
  Filled 2017-06-14 (×3): qty 1

## 2017-06-14 MED ORDER — LORAZEPAM 2 MG/ML IJ SOLN: 0.0000 mg | Freq: Two times a day (BID) | INTRAMUSCULAR | Status: DC

## 2017-06-14 MED ORDER — ZOLPIDEM TARTRATE 5 MG PO TABS: 5.0000 mg | ORAL_TABLET | Freq: Every evening | ORAL | Status: DC | PRN

## 2017-06-14 MED ORDER — POTASSIUM CHLORIDE 10 MEQ/100ML IV SOLN
10.0000 meq | INTRAVENOUS | Status: AC
  Administered 2017-06-14: 10 meq via INTRAVENOUS
  Filled 2017-06-14: qty 100

## 2017-06-14 MED ORDER — ADULT MULTIVITAMIN W/MINERALS CH
1.0000 | ORAL_TABLET | Freq: Every day | ORAL | Status: DC
  Administered 2017-06-14 – 2017-06-18 (×5): 1 via ORAL
  Filled 2017-06-14 (×5): qty 1

## 2017-06-14 MED ORDER — ONDANSETRON HCL 4 MG PO TABS: 4.0000 mg | ORAL_TABLET | Freq: Four times a day (QID) | ORAL | Status: DC | PRN

## 2017-06-14 MED ORDER — THIAMINE HCL 100 MG/ML IJ SOLN: 100.0000 mg | Freq: Every day | INTRAMUSCULAR | Status: DC

## 2017-06-14 MED ORDER — POTASSIUM CHLORIDE CRYS ER 20 MEQ PO TBCR
40.0000 meq | EXTENDED_RELEASE_TABLET | Freq: Once | ORAL | Status: AC
  Administered 2017-06-14: 40 meq via ORAL
  Filled 2017-06-14: qty 2

## 2017-06-14 MED ORDER — ACETAMINOPHEN 325 MG PO TABS
650.0000 mg | ORAL_TABLET | Freq: Four times a day (QID) | ORAL | Status: DC | PRN
  Administered 2017-06-14 – 2017-06-17 (×3): 650 mg via ORAL
  Filled 2017-06-14 (×3): qty 2

## 2017-06-14 MED ORDER — LEVOTHYROXINE SODIUM 100 MCG PO TABS
100.0000 ug | ORAL_TABLET | Freq: Every day | ORAL | Status: DC
  Administered 2017-06-14 – 2017-06-18 (×5): 100 ug via ORAL
  Filled 2017-06-14 (×5): qty 1

## 2017-06-14 MED ORDER — SODIUM CHLORIDE 0.9% FLUSH
3.0000 mL | Freq: Two times a day (BID) | INTRAVENOUS | Status: DC
  Administered 2017-06-15 – 2017-06-18 (×6): 3 mL via INTRAVENOUS

## 2017-06-14 MED ORDER — DEXTROSE-NACL 5-0.9 % IV SOLN
INTRAVENOUS | Status: DC
  Administered 2017-06-14: 05:00:00 via INTRAVENOUS

## 2017-06-14 MED ORDER — SODIUM CHLORIDE 0.9 % IV BOLUS (SEPSIS)
1000.0000 mL | Freq: Once | INTRAVENOUS | Status: AC
  Administered 2017-06-14: 1000 mL via INTRAVENOUS

## 2017-06-14 MED ORDER — FOLIC ACID 1 MG PO TABS
1.0000 mg | ORAL_TABLET | Freq: Every day | ORAL | Status: DC
  Administered 2017-06-14 – 2017-06-18 (×5): 1 mg via ORAL
  Filled 2017-06-14 (×5): qty 1

## 2017-06-14 MED ORDER — MAGNESIUM SULFATE IN D5W 1-5 GM/100ML-% IV SOLN
1.0000 g | Freq: Once | INTRAVENOUS | Status: AC
  Administered 2017-06-14: 1 g via INTRAVENOUS
  Filled 2017-06-14: qty 100

## 2017-06-14 MED ORDER — PANTOPRAZOLE SODIUM 40 MG PO TBEC
40.0000 mg | DELAYED_RELEASE_TABLET | Freq: Two times a day (BID) | ORAL | Status: DC
  Administered 2017-06-14 – 2017-06-18 (×9): 40 mg via ORAL
  Filled 2017-06-14 (×9): qty 1

## 2017-06-14 MED ORDER — LORATADINE 10 MG PO TABS
10.0000 mg | ORAL_TABLET | Freq: Every day | ORAL | Status: DC
  Administered 2017-06-14 – 2017-06-18 (×5): 10 mg via ORAL
  Filled 2017-06-14 (×5): qty 1

## 2017-06-14 MED ORDER — LORAZEPAM 1 MG PO TABS
1.0000 mg | ORAL_TABLET | Freq: Four times a day (QID) | ORAL | Status: DC | PRN
  Administered 2017-06-14 – 2017-06-15 (×2): 1 mg via ORAL
  Filled 2017-06-14 (×2): qty 1

## 2017-06-14 MED ORDER — POTASSIUM CHLORIDE 10 MEQ/100ML IV SOLN
10.0000 meq | Freq: Once | INTRAVENOUS | Status: DC
  Filled 2017-06-14: qty 100

## 2017-06-14 MED ORDER — LORAZEPAM 2 MG/ML IJ SOLN
1.0000 mg | Freq: Four times a day (QID) | INTRAMUSCULAR | Status: DC | PRN
  Administered 2017-06-14: 1 mg via INTRAVENOUS
  Filled 2017-06-14: qty 1

## 2017-06-14 MED ORDER — ONDANSETRON HCL 4 MG/2ML IJ SOLN: 4.0000 mg | Freq: Four times a day (QID) | INTRAMUSCULAR | Status: DC | PRN

## 2017-06-14 MED ORDER — ACETAMINOPHEN 650 MG RE SUPP: 650.0000 mg | Freq: Four times a day (QID) | RECTAL | Status: DC | PRN

## 2017-06-14 NOTE — Progress Notes (Addendum)
[facility]   Patient admitted after midnight see H&P for full details.  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 Presented to the emergency department with generalized body aches and alcohol intoxication.  Patient report feeling safe at home due to her living situation with her husband. She has been drinking since she returned to her house and has been having a lot of marital issues.  In the ED she was found to have a BAL of 206 and hypotensive which improved after IV fluids. Patient was placed on observation to monitor blood pressure and renal function.  Patient has no complaints, chart has been reviewed.   We'll continue to monitor, if stable will be discharged in the morning.  Plan as per H&P, agree with case management on consulting social worker. Monitor for signs of alcohol withdrawal Severe hyperthyroidism - patient was not taking her synthroid, resumed last night will need to follow up TSH in 6 weeks.   Chipper Oman, MD

## 2017-06-14 NOTE — Progress Notes (Signed)
Pt unable to answer many questions. Pt kept dozing off and slurred her words.

## 2017-06-14 NOTE — H&P (Signed)
History and Physical    DEZARIA Mccarthy OAC:[id number] DOB: [birth year] DOA: 06/13/2017  Referring MD/NP/PA:   PCP: Patient, No Pcp Per   Patient coming from:  The patient is coming from home.  At baseline, pt is independent for most of ADL.  Chief Complaint: Alcohol intoxication and generalized body aches  HPI: Susan Mccarthy is a 66 y.o. female with medical history significant of GERD, hypothyroidism secondary to thyroidectomy due to thyroid cancer, depression, anxiety, melanoma, tobacco abuse, alcohol abuse, bipolar disorder, panic disorder, who presents with alcohol intoxication and generalized body aches.  Per report, pt has been drinking alcohol all day.  Patient presents intoxicated per EMS. Patient states that she has aches everywhere. Patient reports that she is having anxiety and depression for her living situation. She says that she sometimes does not feel safe but did not elaborate why. She states that she had diarrhea recently, which has resolved. She report heart burn. Currently no nausea, vomiting, diarrhea or abdominal pain. Denies symptoms of UTI. Patient does not have chest pain or shortness of breath. No cough or chills. No unilateral weakness. She denies any falls today. Patient denies any suicidal ideation or homicidal ideation. Patient reports that she wants to be placed in a safe place. Per EMS, her CBG was 65 and was given 17 g of glucose.  ED Course: pt was found to have alcohol level 206, WBC 4.9, hemoglobin 9.9 which was 11.5 on 05/11/17, potassium 2.9, sodium 133, acute renal injury with creatinine 1.49, temperature normal, no tachycardia, oxygen saturation 94% on room air. Patient was initially hypotensive with blood pressure 80/53, which improved to 112/99 after giving 2 L normal saline bolus in ED. Patient is placed on telemetry bed for observation.  Review of Systems:   General: no fevers, chills, no changes in body weight, has poor appetite, has fatigue and body  aches HEENT: no blurry vision, hearing changes or sore throat Respiratory: no dyspnea, coughing, wheezing CV: no chest pain, no palpitations GI: no nausea, vomiting, abdominal pain, diarrhea, constipation GU: no dysuria, burning on urination, increased urinary frequency, hematuria  Ext: no leg edema Neuro: no unilateral weakness, numbness, or tingling, no vision change or hearing loss Skin: no rash, no skin tear. MSK: No muscle spasm, no deformity, no limitation of range of movement in spin Heme: No easy bruising.  Travel history: No recent long distant travel.  Allergy:  Allergies  Allergen Reactions  . Other Rash and Other (See Comments)    Pt states that she is allergic to anything scented.    Past Medical History:  Diagnosis Date  . Anxiety   . Arthritis   . Depression   . History of chemotherapy    INTERFERON  . Insomnia   . Melanoma ([place]) 1980   RIGHT BACK  . Thyroid ca ([place])   . Thyroid disease    85 % BENIGN    Past Surgical History:  Procedure Laterality Date  . AXILLARY NODE DISSECTION  08/02/11   right axillary,  . Excision of melanoma  1980  . PERCUTANEOUS PINNING Left 08/27/2013   Procedure: PERCUTANEOUS PINNING EXTREMITY;  Surgeon: Rozanna Box, MD;  Location: [facility];  Service: Orthopedics;  Laterality: Left;  . RASH     ON FACE AND HANDS S/P CHEMOTHERAPY  . SACRO-ILIAC PINNING Bilateral 08/27/2013   Procedure: Dub Mikes;  Surgeon: Rozanna Box, MD;  Location: [facility];  Service: Orthopedics;  Laterality: Bilateral;  Handy Bed, OIC Cannulated Screws  .  THYROIDECTOMY  1/14   cancer    Social History:  reports that she has been smoking Cigarettes.  She started smoking about 43 years ago. She has a 1.25 pack-year smoking history. She has never used smokeless tobacco. She reports that she drinks alcohol. She reports that she does not use drugs.  Family History:  Family History  Problem Relation Age of Onset  . ALS Father      Prior to  Admission medications   Medication Sig Start Date End Date Taking? Authorizing Provider  cephALEXin (KEFLEX) 500 MG capsule Take 1 capsule (500 mg total) by mouth 3 (three) times daily. 05/11/17   Jola Schmidt, MD  cholecalciferol (VITAMIN D) 1000 units tablet Take 1,000 Units by mouth daily.    [provider]  fexofenadine-pseudoephedrine (ALLEGRA-D 24) 180-240 MG 24 hr tablet Take 1 tablet by mouth daily.    [provider]  ibuprofen (ADVIL,MOTRIN) 200 MG tablet Take 200-800 mg by mouth every 6 (six) hours as needed for fever, headache, mild pain or moderate pain.     [provider]  levothyroxine (SYNTHROID, LEVOTHROID) 100 MCG tablet Take 1 tablet (100 mcg total) by mouth daily before breakfast. 05/05/17   Patrecia Pour, NP    Physical Exam: Vitals:   06/13/17 2207 06/13/17 2246 06/14/17 0045 06/14/17 0242  BP:  (!) 89/60 94/67 (!) 112/99  Pulse:    76  Resp:    18  Temp:      TempSrc:      SpO2:    94%  Weight: 52.2 kg (115 lb)     Height: 5\' 4"  (1.626 m)      General: Not in acute distress HEENT:       Eyes: PERRL, EOMI, no scleral icterus.       ENT: No discharge from the ears and nose, no pharynx injection, no tonsillar enlargement.        Neck: No JVD, no bruit, no mass felt. Heme: No neck lymph node enlargement. Cardiac: S1/S2, RRR, No murmurs, No gallops or rubs. Respiratory: No rales, wheezing, rhonchi or rubs. GI: Soft, nondistended, nontender, no rebound pain, no organomegaly, BS present. GU: No hematuria Ext: No pitting leg edema bilaterally. 2+DP/PT pulse bilaterally. Musculoskeletal: No joint deformities, No joint redness or warmth, no limitation of ROM in spin. Skin: No rashes.  Neuro: intoxicated, but oriented X3, cranial nerves II-XII grossly intact, moves all extremities normally. Psych: Patient is not psychotic, no suicidal or hemocidal ideation.  Labs on Admission: I have personally reviewed following labs and imaging  studies  CBC:  Recent Labs Lab 06/13/17 2258  WBC 4.5  NEUTROABS 2.9  HGB 9.9*  HCT 27.9*  MCV 94.3  PLT 097   Basic Metabolic Panel:  Recent Labs Lab 06/14/17 0031  NA 133*  K 2.9*  CL 97*  CO2 23  GLUCOSE 71  BUN 18  CREATININE 1.49*  CALCIUM 8.0*   GFR: Estimated Creatinine Clearance: 29.8 mL/min (A) (by C-G formula based on SCr of 1.49 mg/dL (H)). Liver Function Tests:  Recent Labs Lab 06/14/17 0031  AST 55*  ALT 18  ALKPHOS 128*  BILITOT 0.7  PROT 6.6  ALBUMIN 4.0    Recent Labs Lab 06/14/17 0031  LIPASE 29   No results for input(s): AMMONIA in the last 168 hours. Coagulation Profile: No results for input(s): INR, PROTIME in the last 168 hours. Cardiac Enzymes: No results for input(s): CKTOTAL, CKMB, CKMBINDEX, TROPONINI in the last 168 hours. BNP (  last 3 results) No results for input(s): PROBNP in the last 8760 hours. HbA1C: No results for input(s): HGBA1C in the last 72 hours. CBG:  Recent Labs Lab 06/13/17 2159  GLUCAP 96   Lipid Profile: No results for input(s): CHOL, HDL, LDLCALC, TRIG, CHOLHDL, LDLDIRECT in the last 72 hours. Thyroid Function Tests: No results for input(s): TSH, T4TOTAL, FREET4, T3FREE, THYROIDAB in the last 72 hours. Anemia Panel: No results for input(s): VITAMINB12, FOLATE, FERRITIN, TIBC, IRON, RETICCTPCT in the last 72 hours. Urine analysis:    Component Value Date/Time   COLORURINE STRAW (A) 05/11/2017 0314   APPEARANCEUR CLEAR 05/11/2017 0314   LABSPEC 1.003 (L) 05/11/2017 0314   PHURINE 6.0 05/11/2017 0314   GLUCOSEU NEGATIVE 05/11/2017 0314   GLUCOSEU NEGATIVE 08/22/2015 1654   HGBUR NEGATIVE 05/11/2017 0314   BILIRUBINUR NEGATIVE 05/11/2017 0314   KETONESUR NEGATIVE 05/11/2017 0314   PROTEINUR NEGATIVE 05/11/2017 0314   UROBILINOGEN 0.2 08/22/2015 1654   NITRITE POSITIVE (A) 05/11/2017 0314   LEUKOCYTESUR NEGATIVE 05/11/2017 0314   Sepsis Labs: @LABRCNTIP (procalcitonin:4,lacticidven:4) )No  results found for this or any previous visit (from the past 240 hour(s)).   Radiological Exams on Admission: No results found.   EKG: Independently reviewed.  Sinus rhythm, QTC 409, LAD, anteroseptal infarction pattern.   Assessment/Plan Principal Problem:   AKI (acute kidney injury) ([place]) Active Problems:   GERD (gastroesophageal reflux disease)   Hypothyroidism, postop   Alcohol use disorder, severe, dependence (HCC)   Hypotension   Hypokalemia   Normocytic anemia   Hyponatremia  AKI: Likely due to prerenal secondary to dehydration and continuation of NSAIDs. ATN is also possible due to hypotension. -will place on tele bed for obs - IVF: 3L NS, then D5-NS at 100 cc/h - Check FeNa  - Follow up renal function by BMP - Hold ibuprofen  Hypotension: Patient was initially hypotensive with blood pressure 80/53, which improved to 112/99 after giving 2 L normal saline bolus in ED. etiology is not clear. Patient does not have fever or leukocytosis, clinically does not seem to have infection. Most likely due to dehydration secondary to alcohol abuse. -IVF as above -f/u Blood culture -F/u UA -check cortisol level  GERD: -Protonix 40 mg bid  Hypothyroidism, postop: Last TSH was 104 on 05/03/17 and free T4<0.25 -Continue home Synthroid -Check TSH and Free T4  Tobacco abuse and Alcohol abuse and alcohol intoxication -Did counseling about importance of quitting smoking -Nicotine patch -Did counseling about the importance of quitting drinking -CIWA protocol  Hypokalemia: K= 2.9 on admission. - Repleted K - give 1 gram of magnesium sulfate - Check Mg level   Mild Hyponatremia: Na 133. Likely due to potomania. -IVF as above  Normocytic anemia: Hemoglobin is trending down from 13.0 on 05/09/17-->11.5 on 05/11/17-->9.9 today. Patient refused rectal exam per ED physician. -check anemia panel -check FOBT -f/u by CBC -on protonix 40 mg bid   DVT ppx: SCD Code Status: Full  code Family Communication: None at bed side.    Disposition Plan:  Anticipate discharge back to previous home environment Consults called:  none Admission status: Obs / tele   Date of Service 06/14/2017    Ivor Costa [facility] Pager [phone]  If 7PM-7AM, please contact night-coverage [email or web address] Password [facility] 06/14/2017, 3:57 AM

## 2017-06-14 NOTE — ED Notes (Signed)
Writer states they went into room to help patient with restroom assistance and Probation officer stated they did not want help from Probation officer and stated to Probation officer "get out of this room little girl". Writer states they left the room and let the RN know.

## 2017-06-14 NOTE — Progress Notes (Signed)
Spoke with pt concerning Susan Mccarthy needs pt states she has no needs. Pt continued that she go to out pt rehab with [facility]mith at [facility]. CSW will see pt for ETOH Abuse resources.

## 2017-06-14 NOTE — ED Provider Notes (Signed)
Patient with alcohol intoxication, signed out to me to allow to metabolize until she was safe to go home. However, it is noted that she was hypotensive on arrival. Blood pressure did come up with IV hydration. Following IV hydration, is that of vital signs showed no significant change in pulse or blood pressure. However, laboratory evaluation showed several things of concern. Potassium is 2.9, and she is given oral and intravenous potassium. Creatinine is 1.49, compared with 0.741 month ago. Hemoglobin is 9.9, which is a 3 g drop over the last month. She refused rectal exam. She does not have a PCP, and I am concerned about appropriate follow-up. Case is discussed with Dr. Blaine Hamper of [facility] who agrees to admit the patient.  Results for orders placed or performed during the hospital encounter of 06/13/17  CBC with Differential  Result Value Ref Range   WBC 4.5 4.0 - 10.5 K/uL   RBC 2.96 (L) 3.87 - 5.11 MIL/uL   Hemoglobin 9.9 (L) 12.0 - 15.0 g/dL   HCT 27.9 (L) 36.0 - 46.0 %   MCV 94.3 78.0 - 100.0 fL   MCH 33.4 26.0 - 34.0 pg   MCHC 35.5 30.0 - 36.0 g/dL   RDW 15.0 11.5 - 15.5 %   Platelets 209 150 - 400 K/uL   Neutrophils Relative % 64 %   Neutro Abs 2.9 1.7 - 7.7 K/uL   Lymphocytes Relative 26 %   Lymphs Abs 1.2 0.7 - 4.0 K/uL   Monocytes Relative 7 %   Monocytes Absolute 0.3 0.1 - 1.0 K/uL   Eosinophils Relative 2 %   Eosinophils Absolute 0.1 0.0 - 0.7 K/uL   Basophils Relative 1 %   Basophils Absolute 0.0 0.0 - 0.1 K/uL  Ethanol  Result Value Ref Range   Alcohol, Ethyl (B) 206 (H) <5 mg/dL  Comprehensive metabolic panel  Result Value Ref Range   Sodium 133 (L) 135 - 145 mmol/L   Potassium 2.9 (L) 3.5 - 5.1 mmol/L   Chloride 97 (L) 101 - 111 mmol/L   CO2 23 22 - 32 mmol/L   Glucose, Bld 71 65 - 99 mg/dL   BUN 18 6 - 20 mg/dL   Creatinine, Ser 1.49 (H) 0.44 - 1.00 mg/dL   Calcium 8.0 (L) 8.9 - 10.3 mg/dL   Total Protein 6.6 6.5 - 8.1 g/dL   Albumin 4.0 3.5 - 5.0 g/dL    AST 55 (H) 15 - 41 U/L   ALT 18 14 - 54 U/L   Alkaline Phosphatase 128 (H) 38 - 126 U/L   Total Bilirubin 0.7 0.3 - 1.2 mg/dL   GFR calc non Af Amer 35 (L) >60 mL/min   GFR calc Af Amer 40 (L) >60 mL/min   Anion gap 13 5 - 15  Lipase, blood  Result Value Ref Range   Lipase 29 11 - 51 U/L  CBG monitoring, ED  Result Value Ref Range   Glucose-Capillary 96 65 - 99 mg/dL   No results found.    Delora Fuel, MD 33/00/76 [phone]

## 2017-06-15 DIAGNOSIS — I959 Hypotension, unspecified: Secondary | ICD-10-CM | POA: Diagnosis present

## 2017-06-15 DIAGNOSIS — K219 Gastro-esophageal reflux disease without esophagitis: Secondary | ICD-10-CM | POA: Diagnosis present

## 2017-06-15 DIAGNOSIS — R441 Visual hallucinations: Secondary | ICD-10-CM | POA: Diagnosis not present

## 2017-06-15 DIAGNOSIS — Z85820 Personal history of malignant melanoma of skin: Secondary | ICD-10-CM | POA: Diagnosis not present

## 2017-06-15 DIAGNOSIS — F10230 Alcohol dependence with withdrawal, uncomplicated: Secondary | ICD-10-CM | POA: Diagnosis not present

## 2017-06-15 DIAGNOSIS — Y907 Blood alcohol level of 200-239 mg/100 ml: Secondary | ICD-10-CM | POA: Diagnosis present

## 2017-06-15 DIAGNOSIS — F10220 Alcohol dependence with intoxication, uncomplicated: Secondary | ICD-10-CM | POA: Diagnosis present

## 2017-06-15 DIAGNOSIS — N179 Acute kidney failure, unspecified: Secondary | ICD-10-CM | POA: Diagnosis present

## 2017-06-15 DIAGNOSIS — F10920 Alcohol use, unspecified with intoxication, uncomplicated: Secondary | ICD-10-CM

## 2017-06-15 DIAGNOSIS — D649 Anemia, unspecified: Secondary | ICD-10-CM | POA: Diagnosis not present

## 2017-06-15 DIAGNOSIS — F319 Bipolar disorder, unspecified: Secondary | ICD-10-CM | POA: Diagnosis present

## 2017-06-15 DIAGNOSIS — F419 Anxiety disorder, unspecified: Secondary | ICD-10-CM | POA: Diagnosis present

## 2017-06-15 DIAGNOSIS — E890 Postprocedural hypothyroidism: Secondary | ICD-10-CM

## 2017-06-15 DIAGNOSIS — C73 Malignant neoplasm of thyroid gland: Secondary | ICD-10-CM | POA: Diagnosis not present

## 2017-06-15 DIAGNOSIS — E876 Hypokalemia: Secondary | ICD-10-CM | POA: Diagnosis not present

## 2017-06-15 DIAGNOSIS — Z79899 Other long term (current) drug therapy: Secondary | ICD-10-CM | POA: Diagnosis not present

## 2017-06-15 DIAGNOSIS — C4359 Malignant melanoma of other part of trunk: Secondary | ICD-10-CM | POA: Diagnosis not present

## 2017-06-15 DIAGNOSIS — C7800 Secondary malignant neoplasm of unspecified lung: Secondary | ICD-10-CM | POA: Diagnosis not present

## 2017-06-15 DIAGNOSIS — F1020 Alcohol dependence, uncomplicated: Secondary | ICD-10-CM | POA: Diagnosis not present

## 2017-06-15 DIAGNOSIS — F17210 Nicotine dependence, cigarettes, uncomplicated: Secondary | ICD-10-CM | POA: Diagnosis present

## 2017-06-15 DIAGNOSIS — N390 Urinary tract infection, site not specified: Secondary | ICD-10-CM | POA: Diagnosis present

## 2017-06-15 DIAGNOSIS — Z85850 Personal history of malignant neoplasm of thyroid: Secondary | ICD-10-CM | POA: Diagnosis not present

## 2017-06-15 DIAGNOSIS — E871 Hypo-osmolality and hyponatremia: Secondary | ICD-10-CM | POA: Diagnosis present

## 2017-06-15 DIAGNOSIS — E860 Dehydration: Secondary | ICD-10-CM | POA: Diagnosis present

## 2017-06-15 LAB — URINALYSIS, ROUTINE W REFLEX MICROSCOPIC
Bilirubin Urine: NEGATIVE
Glucose, UA: NEGATIVE mg/dL
KETONES UR: 5 mg/dL — AB
Nitrite: POSITIVE — AB
PROTEIN: 30 mg/dL — AB
Specific Gravity, Urine: 1.015 (ref 1.005–1.030)
pH: 7 (ref 5.0–8.0)

## 2017-06-15 LAB — RAPID URINE DRUG SCREEN, HOSP PERFORMED
AMPHETAMINES: NOT DETECTED
BENZODIAZEPINES: POSITIVE — AB
Barbiturates: NOT DETECTED
COCAINE: NOT DETECTED
OPIATES: NOT DETECTED
Tetrahydrocannabinol: NOT DETECTED

## 2017-06-15 LAB — BASIC METABOLIC PANEL
Anion gap: 12 (ref 5–15)
BUN: 14 mg/dL (ref 6–20)
CALCIUM: 8.7 mg/dL — AB (ref 8.9–10.3)
CO2: 23 mmol/L (ref 22–32)
Chloride: 96 mmol/L — ABNORMAL LOW (ref 101–111)
Creatinine, Ser: 0.75 mg/dL (ref 0.44–1.00)
GFR calc Af Amer: >60 mL/min (ref >60)
GFR calc non Af Amer: >60 mL/min (ref >60)
GLUCOSE: 92 mg/dL (ref 65–99)
Potassium: 3.4 mmol/L — ABNORMAL LOW (ref 3.5–5.1)
Sodium: 131 mmol/L — ABNORMAL LOW (ref 135–145)

## 2017-06-15 LAB — IRON AND TIBC
Iron: 119 ug/dL (ref 28–170)
SATURATION RATIOS: 33 % — AB (ref 10.4–31.8)
TIBC: 364 ug/dL (ref 250–450)
UIBC: 245 ug/dL

## 2017-06-15 LAB — VITAMIN B12: Vitamin B-12: 309 pg/mL (ref 180–914)

## 2017-06-15 LAB — FERRITIN: FERRITIN: 112 ng/mL (ref 11–307)

## 2017-06-15 LAB — RETICULOCYTES
RBC.: 3.63 MIL/uL — AB (ref 3.87–5.11)
Retic Count, Absolute: 87.1 10*3/uL (ref 19.0–186.0)
Retic Ct Pct: 2.4 % (ref 0.4–3.1)

## 2017-06-15 LAB — FOLATE: FOLATE: 17.2 ng/mL (ref >5.9)

## 2017-06-15 MED ORDER — CHLORDIAZEPOXIDE HCL 25 MG PO CAPS: 25.0000 mg | ORAL_CAPSULE | Freq: Two times a day (BID) | ORAL | Status: DC

## 2017-06-15 MED ORDER — LORAZEPAM 2 MG/ML IJ SOLN: 1.0000 mg | Freq: Four times a day (QID) | INTRAMUSCULAR | Status: AC | PRN

## 2017-06-15 MED ORDER — LORAZEPAM 1 MG PO TABS
1.0000 mg | ORAL_TABLET | Freq: Four times a day (QID) | ORAL | Status: AC | PRN
  Administered 2017-06-16: 1 mg via ORAL
  Filled 2017-06-15 (×2): qty 1

## 2017-06-15 MED ORDER — POTASSIUM CHLORIDE CRYS ER 20 MEQ PO TBCR
40.0000 meq | EXTENDED_RELEASE_TABLET | ORAL | Status: AC
  Administered 2017-06-15 (×2): 40 meq via ORAL
  Filled 2017-06-15 (×2): qty 2

## 2017-06-15 MED ORDER — CHLORDIAZEPOXIDE HCL 25 MG PO CAPS
25.0000 mg | ORAL_CAPSULE | Freq: Three times a day (TID) | ORAL | Status: AC
  Administered 2017-06-17 – 2017-06-18 (×3): 25 mg via ORAL
  Filled 2017-06-15 (×3): qty 1

## 2017-06-15 MED ORDER — CHLORDIAZEPOXIDE HCL 25 MG PO CAPS
50.0000 mg | ORAL_CAPSULE | Freq: Three times a day (TID) | ORAL | Status: AC
  Administered 2017-06-15 – 2017-06-16 (×3): 50 mg via ORAL
  Filled 2017-06-15 (×3): qty 2

## 2017-06-15 MED ORDER — CHLORDIAZEPOXIDE HCL 25 MG PO CAPS
25.0000 mg | ORAL_CAPSULE | Freq: Four times a day (QID) | ORAL | Status: AC
  Administered 2017-06-16 – 2017-06-17 (×4): 25 mg via ORAL
  Filled 2017-06-15 (×4): qty 1

## 2017-06-15 MED ORDER — SODIUM CHLORIDE 0.9% FLUSH: 3.0000 mL | INTRAVENOUS | Status: DC | PRN
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
## 2017-06-15 NOTE — Plan of Care (Signed)
Problem: Safety: Goal: Ability to remain free from injury will improve Outcome: Not Met (add Reason) Pt is confused at times
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
## 2017-06-15 NOTE — Evaluation (Signed)
Physical Therapy Evaluation Patient Details Name: Susan Mccarthy MRN: [id number] DOB: [birth year] Today's Date: 06/15/2017   History of Present Illness  66 yo female with histor signifincant for GERD, Hyopthyroidism, biplar disorder admitted with intoxication and acute kidney injury    Clinical Impression  Pt lethargic, but woke up to my voice.  She was not able to reliably answer questions. She was able to walk with manual guidance, but had not regard for safety. She will need 24 hour assist and follow up PT     Follow Up Recommendations Home health PT;SNF (if ALF can provide assist pt needs, HHPT)    Equipment Recommendations  Rolling walker with 5" wheels (if she doesn't have one )    Recommendations for Other Services       Precautions / Restrictions Precautions Precautions: Fall Restrictions Weight Bearing Restrictions: No      Mobility  Bed Mobility Overal bed mobility: Needs Assistance Bed Mobility: Rolling;Supine to Sit           General bed mobility comments: pt content to lie down where she lands, does not want to be moved to be repositioned.   Transfers Overall transfer level: Needs assistance Equipment used: Rolling walker (2 wheeled)             General transfer comment: needs manual cues for safety   Ambulation/Gait Ambulation/Gait assistance: Min assist Ambulation Distance (Feet): 25 Feet Assistive device: Rolling walker (2 wheeled) Gait Pattern/deviations: Decreased step length - right;Decreased step length - left Gait velocity:  (decreased) Gait velocity interpretation: Below normal speed for age/gender General Gait Details: pt does not follow directions for improvement in technique or safety   Stairs            Wheelchair Mobility    Modified Rankin (Stroke Patients Only)       Balance Overall balance assessment: Needs assistance   Sitting balance-Leahy Scale: Good       Standing balance-Leahy Scale: Fair                               Pertinent Vitals/Pain Pain Assessment: Faces Faces Pain Scale: Hurts little more Pain Location: left side     Home Living Family/patient expects to be discharged to:: Assisted living Living Arrangements: Alone Available Help at Discharge:  (unable to assess as pt not answering questions )                  Prior Function           Comments: unknown      Hand Dominance        Extremity/Trunk Assessment   Upper Extremity Assessment Upper Extremity Assessment: Overall WFL for tasks assessed (some difficulty raising left arm, used right arm to help )    Lower Extremity Assessment Lower Extremity Assessment: Overall WFL for tasks assessed (thin with decreased muscle mass)       Communication   Communication: No difficulties  Cognition Arousal/Alertness: Lethargic Behavior During Therapy: Restless Overall Cognitive Status: No family/caregiver present to determine baseline cognitive functioning                                 General Comments: Pt openedn eyes and attmepted to answer questions, but some speech was not intelligible.  She followed gentle manual cues for walking, but wanted to lie down and closed her eyes  as soon as she was near the bed       General Comments General comments (skin integrity, edema, etc.): Pt was not able to follow directions for safety.  She wanted to wear slide in shoes instead of non skid socks and insisted on carrying clothing in her hands while using walker and appeard to be at risk to increase agitation if challenged.     Exercises     Assessment/Plan    PT Assessment Patient needs continued PT services;All further PT needs can be met in the next venue of care  PT Problem List Decreased strength;Decreased activity tolerance;Decreased balance;Decreased cognition;Decreased knowledge of use of DME;Decreased safety awareness       PT Treatment Interventions DME instruction;Gait  training;Functional mobility training;Therapeutic activities;Therapeutic exercise;Balance training;Patient/family education    PT Goals (Current goals can be found in the Care Plan section)  Acute Rehab PT Goals Patient Stated Goal: none stated  PT Goal Formulation: Patient unable to participate in goal setting Potential to Achieve Goals: Fair    Frequency Min 3X/week   Barriers to discharge Decreased caregiver support (unsure if she will have adequate support at discharge ) pt will need 24/7 assist and follow up PT at discharge     Co-evaluation               AM-PAC PT "6 Clicks" Daily Activity  Outcome Measure Difficulty turning over in bed (including adjusting bedclothes, sheets and blankets)?: Total Difficulty moving from lying on back to sitting on the side of the bed? : Total Difficulty sitting down on and standing up from a chair with arms (e.g., wheelchair, bedside commode, etc,.)?: Total Help needed moving to and from a bed to chair (including a wheelchair)?: A Lot Help needed walking in hospital room?: A Lot Help needed climbing 3-5 steps with a railing? : A Lot 6 Click Score: 9    End of Session Equipment Utilized During Treatment: Gait belt Activity Tolerance: Patient limited by fatigue;Treatment limited secondary to agitation (limited by risk of agitation ) Patient left: in bed;with bed alarm set   PT Visit Diagnosis: Unsteadiness on feet (R26.81);Muscle weakness (generalized) (M62.81);Difficulty in walking, not elsewhere classified (R26.2)    Time: 1140-1200 PT Time Calculation (min) (ACUTE ONLY): 20 min   Charges:   PT Evaluation $PT Eval Low Complexity: 1 Procedure     PT G Codes:   PT G-Codes **NOT FOR INPATIENT CLASS** Functional Assessment Tool Used: AM-PAC 6 Clicks Basic Mobility Functional Limitation: Mobility: Walking and moving around Mobility: Walking and Moving Around Current Status (G8366): At least 60 percent but less than 80 percent  impaired, limited or restricted Mobility: Walking and Moving Around Goal Status [phone]): At least 20 percent but less than 40 percent impaired, limited or restricted  Helene Kelp K. Owens Shark, PT   Norwood Levo 06/15/2017, 12:20 PM

## 2017-06-16 DIAGNOSIS — F1020 Alcohol dependence, uncomplicated: Secondary | ICD-10-CM

## 2017-06-16 DIAGNOSIS — N390 Urinary tract infection, site not specified: Secondary | ICD-10-CM

## 2017-06-16 DIAGNOSIS — D649 Anemia, unspecified: Secondary | ICD-10-CM

## 2017-06-16 LAB — CBC
HEMATOCRIT: 37.5 % (ref 36.0–46.0)
Hemoglobin: 13 g/dL (ref 12.0–15.0)
MCH: 33.4 pg (ref 26.0–34.0)
MCHC: 34.7 g/dL (ref 30.0–36.0)
MCV: 96.4 fL (ref 78.0–100.0)
PLATELETS: 194 10*3/uL (ref 150–400)
RBC: 3.89 MIL/uL (ref 3.87–5.11)
RDW: 14.4 % (ref 11.5–15.5)
WBC: 5.5 10*3/uL (ref 4.0–10.5)

## 2017-06-16 LAB — BASIC METABOLIC PANEL
Anion gap: 10 (ref 5–15)
BUN: 13 mg/dL (ref 6–20)
CO2: 24 mmol/L (ref 22–32)
CREATININE: 0.84 mg/dL (ref 0.44–1.00)
Calcium: 9 mg/dL (ref 8.9–10.3)
Chloride: 97 mmol/L — ABNORMAL LOW (ref 101–111)
GFR calc Af Amer: >60 mL/min (ref >60)
GLUCOSE: 82 mg/dL (ref 65–99)
POTASSIUM: 4.9 mmol/L (ref 3.5–5.1)
Sodium: 131 mmol/L — ABNORMAL LOW (ref 135–145)

## 2017-06-16 LAB — SODIUM, URINE, RANDOM: Sodium, Ur: 122 mmol/L

## 2017-06-16 LAB — CREATININE, URINE, RANDOM: CREATININE, URINE: 112.29 mg/dL

## 2017-06-16 LAB — GLUCOSE, CAPILLARY: Glucose-Capillary: 80 mg/dL (ref 65–99)

## 2017-06-16 MED ORDER — LORAZEPAM 2 MG/ML IJ SOLN: 1.0000 mg | Freq: Once | INTRAMUSCULAR | Status: AC

## 2017-06-16 MED ORDER — LORAZEPAM 2 MG/ML IJ SOLN
INTRAMUSCULAR | Status: AC
  Administered 2017-06-16: 1 mg
  Filled 2017-06-16: qty 1

## 2017-06-16 MED ORDER — CEFUROXIME AXETIL 250 MG PO TABS
250.0000 mg | ORAL_TABLET | Freq: Two times a day (BID) | ORAL | Status: DC
  Administered 2017-06-16 – 2017-06-18 (×6): 250 mg via ORAL
  Filled 2017-06-16 (×6): qty 1

## 2017-06-16 NOTE — NC FL2 (Signed)
[place] LEVEL OF CARE SCREENING TOOL     IDENTIFICATION  Patient Name: Susan Mccarthy Birthdate: [birth year] Sex: female Admission Date (Current Location): 06/13/2017  [facility] and [place] Number:  Herbalist and Address:  [facility] [place], [place] [address], [place], [place] [address]      Provider Number: [id number]  Attending Physician Name and Address:  Patrecia Pour, Christean Grief, MD  Relative Name and Phone Number:       Current Level of Care: SNF Recommended Level of Care: [place] Prior Approval Number:    Date Approved/Denied: 06/16/17 PASRR Number: [id number]  Discharge Plan: SNF    Current Diagnoses: Patient Active Problem List   Diagnosis Date Noted  . Hypotension 06/14/2017  . Hypokalemia 06/14/2017  . AKI (acute kidney injury) ([place]) 06/14/2017  . Normocytic anemia 06/14/2017  . Hyponatremia 06/14/2017  . Alcoholic intoxication without complication ([place])   . Anxiety   . Hypothyroid 05/04/2017  . Paranoia ([place])   . Generalized anxiety disorder 08/06/2016  . Panic disorder 08/06/2016  . Alcohol use disorder, severe, dependence ([place]) 06/25/2016  . Alcohol withdrawal ([place]) 06/25/2016  . Opioid use disorder, moderate, dependence ([place]) 06/25/2016  . Bipolar disorder, current episode mixed ([place]) 06/25/2016  . Bulimia nervosa 06/25/2016  . Hypothyroidism, postop 07/08/2014  . GERD (gastroesophageal reflux disease) 01/12/2013  . Cancer of thyroid ([place]) 12/19/2012  . Malignant melanoma, metastatic ([place]) 12/10/2012  . Arthritis, degenerative 12/10/2012    Orientation RESPIRATION BLADDER Height & Weight     Self, Place  Normal Incontinent Weight: 125 lb (56.7 kg) Height:  5\' 4"  (162.6 cm)  BEHAVIORAL SYMPTOMS/MOOD NEUROLOGICAL BOWEL NUTRITION STATUS      Continent Diet (See DC Summary)  AMBULATORY STATUS COMMUNICATION OF NEEDS Skin   Limited Assist Verbally Normal                       Personal  Care Assistance Level of Assistance  Bathing, Feeding, Dressing Bathing Assistance: Limited assistance Feeding assistance: Independent Dressing Assistance: Limited assistance     Functional Limitations Info             SPECIAL CARE FACTORS FREQUENCY  PT (By licensed PT)     PT Frequency: 3xweek              Contractures      Additional Factors Info  Code Status, Allergies, Psychotropic Code Status Info: Full Code Allergies Info: other-fragrances/scented items Psychotropic Info: Ativan         Current Medications (06/16/2017):  This is the current hospital active medication list Current Facility-Administered Medications  Medication Dose Route Frequency Provider Last Rate Last Dose  . acetaminophen (TYLENOL) tablet 650 mg  650 mg Oral Q6H PRN Ivor Costa, MD   650 mg at 06/14/17 1057   Or  . acetaminophen (TYLENOL) suppository 650 mg  650 mg Rectal Q6H PRN Ivor Costa, MD      . cefUROXime (CEFTIN) tablet 250 mg  250 mg Oral BID WC Patrecia Pour, Christean Grief, MD   250 mg at 06/16/17 1039  . chlordiazePOXIDE (LIBRIUM) capsule 25 mg  25 mg Oral QID Patrecia Pour, Christean Grief, MD       Followed by  . [START ON 06/17/2017] chlordiazePOXIDE (LIBRIUM) capsule 25 mg  25 mg Oral TID Doreatha Lew, MD       Followed by  . [START ON 06/18/2017] chlordiazePOXIDE (LIBRIUM) capsule 25 mg  25 mg Oral Q12H  Patrecia Pour, Christean Grief, MD      . cholecalciferol (VITAMIN D) tablet 1,000 Units  1,000 Units Oral Daily Ivor Costa, MD   1,000 Units at 06/16/17 [phone]  . folic acid (FOLVITE) tablet 1 mg  1 mg Oral Daily Ivor Costa, MD   1 mg at 06/16/17 4142  . levothyroxine (SYNTHROID, LEVOTHROID) tablet 100 mcg  100 mcg Oral QAC breakfast Ivor Costa, MD   100 mcg at 06/16/17 [phone]  . loratadine (CLARITIN) tablet 10 mg  10 mg Oral Daily Ivor Costa, MD   10 mg at 06/16/17 2023  . LORazepam (ATIVAN) tablet 1 mg  1 mg Oral Q6H PRN Patrecia Pour, Christean Grief, MD   1 mg at 06/16/17 1039   Or  . LORazepam (ATIVAN) injection  1 mg  1 mg Intravenous Q6H PRN Patrecia Pour, Christean Grief, MD      . LORazepam (ATIVAN) injection 1 mg  1 mg Intramuscular Once Patrecia Pour, Christean Grief, MD      . multivitamin with minerals tablet 1 tablet  1 tablet Oral Daily Ivor Costa, MD   1 tablet at 06/16/17 [phone]  . nicotine (NICODERM CQ - dosed in mg/24 hours) patch 21 mg  21 mg Transdermal Daily Ivor Costa, MD   21 mg at 06/16/17 0853  . ondansetron (ZOFRAN) tablet 4 mg  4 mg Oral Q6H PRN Ivor Costa, MD       Or  . ondansetron (ZOFRAN) injection 4 mg  4 mg Intravenous Q6H PRN Ivor Costa, MD      . pantoprazole (PROTONIX) EC tablet 40 mg  40 mg Oral BID Ivor Costa, MD   40 mg at 06/16/17 0857  . sodium chloride flush (NS) 0.9 % injection 3 mL  3 mL Intravenous Q12H Ivor Costa, MD   3 mL at 06/15/17 1000  . sodium chloride flush (NS) 0.9 % injection 3 mL  3 mL Intravenous PRN Patrecia Pour, Christean Grief, MD      . thiamine (VITAMIN B-1) tablet 100 mg  100 mg Oral Daily Ivor Costa, MD   100 mg at 06/16/17 6861   Or  . thiamine (B-1) injection 100 mg  100 mg Intravenous Daily Ivor Costa, MD      . zolpidem (AMBIEN) tablet 5 mg  5 mg Oral QHS PRN Ivor Costa, MD       Facility-Administered Medications Ordered in Other Encounters  Medication Dose Route Frequency Provider Last Rate Last Dose  . LORazepam (ATIVAN) injection 0.5 mg  0.5 mg Intravenous Once Volanda Napoleon, MD         Discharge Medications: Please see discharge summary for a list of discharge medications.  Relevant Imaging Results:  Relevant Lab Results:   Additional Information SS: [id number] [address], LCSW
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
## 2017-06-17 ENCOUNTER — Inpatient Hospital Stay (HOSPITAL_COMMUNITY): Payer: BLUE CROSS/BLUE SHIELD

## 2017-06-17 ENCOUNTER — Encounter (HOSPITAL_COMMUNITY): Payer: Self-pay | Admitting: Radiology

## 2017-06-17 DIAGNOSIS — N179 Acute kidney failure, unspecified: Principal | ICD-10-CM

## 2017-06-17 DIAGNOSIS — C4359 Malignant melanoma of other part of trunk: Secondary | ICD-10-CM

## 2017-06-17 DIAGNOSIS — C7800 Secondary malignant neoplasm of unspecified lung: Secondary | ICD-10-CM

## 2017-06-17 DIAGNOSIS — C73 Malignant neoplasm of thyroid gland: Secondary | ICD-10-CM

## 2017-06-17 LAB — GLUCOSE, CAPILLARY: Glucose-Capillary: 95 mg/dL (ref 65–99)

## 2017-06-17 MED ORDER — IOPAMIDOL (ISOVUE-300) INJECTION 61%
100.0000 mL | Freq: Once | INTRAVENOUS | Status: AC | PRN
  Administered 2017-06-17: 100 mL via INTRAVENOUS

## 2017-06-17 MED ORDER — IOPAMIDOL (ISOVUE-300) INJECTION 61%
15.0000 mL | Freq: Once | INTRAVENOUS | Status: DC | PRN
Start: 2017-06-17 — End: 2017-06-18

## 2017-06-17 MED ORDER — IOPAMIDOL (ISOVUE-300) INJECTION 61%
INTRAVENOUS | Status: AC
  Administered 2017-06-17: 15 mL
  Filled 2017-06-17: qty 30

## 2017-06-17 MED ORDER — IOPAMIDOL (ISOVUE-300) INJECTION 61%
INTRAVENOUS | Status: AC
  Filled 2017-06-17: qty 100

## 2017-06-17 NOTE — Progress Notes (Signed)
PROGRESS NOTE [facility]   JAZMIN LEY   ZOX:[id number] DOB: 21-Jun-1951  DOA: 06/13/2017 PCP: Patient, No Pcp Per   Brief Narrative:  Patient is a 66 year old female with medical history significant for GERD, hyperthyroidism secondary to thyroidectomy due to thyroid cancer, depression, anxiety, polysubstance abuse and bipolar disorder. Presented to the emergency department with generalized body aches and alcohol intoxication. Patient was admitted for hypotensive episode, awaiting sobriety and monitor for alcohol withdrawal. Hospital course has been complicated with agitation and UTI.   Subjective: Patient seen and examined, continues to do well. Was seen by Oncology, ordered CT chest, abd and pelvis for cancer surveillance.   Assessment & Plan: Acute renal failure - resolved  Secondary to dehydration Treated with IVF   Alcohol intoxication with alcohol withdrawal - CIWA done by me 5 Continue Librium taper  Continue CIWA protocol with IV ativan PRN   UTI  UA consistent with UTI + nitrates and high WBC's  this could be exacerbating her symptoms of confusion  Getting treatment with Ceftin as patient did not have an IV line  Urine cultures were cancelled by the lab, unknown why. Will follow clinically   Severe hypothyroidism Patient was not taking her home dose Synthroid. This medication was resumed on admission will need to recheck TSH in 6 weeks. Last TSH about 2 months ago was 100 which is trending down  Hyponatremia likely related to hypothyroidism Continue to monitor  Hypokalemia - resolved  Repeat Check BMP and mag in the morning.  Normocytic anemia due to hemodilution now Hgb back to normal levels  Resolved   Hx of metastatic melanoma  Oncology recommendation appreciated  CT with multiple fractures unclear if they are pathologic from malignancy - will discuss with oncology for further recommendations ? Biopsy   Social issues SNF   DVT prophylaxis:  SCDs Code Status: Full code Family Communication: None at bedside Disposition Plan: Likely SNF  Consultants:   None  Procedures:   None  Antimicrobials: Anti-infectives    Start     Dose/Rate Route Frequency Ordered Stop   06/16/17 0900  cefUROXime (CEFTIN) tablet 250 mg     250 mg Oral 2 times daily with meals 06/16/17 0811        Objective: Vitals:   06/16/17 1740 06/16/17 2147 06/17/17 0001 06/17/17 0406  BP: (!) 133/96 125/89 (!) 130/98 122/86  Pulse: 79 80 73 71  Resp: 17 20 20 20   Temp: 97.9 F (36.6 C) 98.5 F (36.9 C) 98 F (36.7 C) 97.7 F (36.5 C)  TempSrc: Oral Oral Oral Oral  SpO2: 99% 96% 97% 95%  Weight:      Height:        Intake/Output Summary (Last 24 hours) at 06/17/17 1554 Last data filed at 06/17/17 0900  Gross per 24 hour  Intake              120 ml  Output              300 ml  Net             -180 ml   Filed Weights   06/13/17 2207 06/14/17 0510  Weight: 52.2 kg (115 lb) 56.7 kg (125 lb)    Examination:  General: Pt is alert, awake, not in acute distress Cardiovascular: RRR, S1/S2 +, no rubs, no gallops Respiratory: CTA bilaterally, no wheezing, no rhonchi Abdominal: Soft, NT, ND, bowel sounds + Extremities: no edema, no cyanosis Neuro: Alert and oriented no hallucinations.  Data Reviewed: I have personally reviewed following labs and imaging studies  CBC:  Recent Labs Lab 06/13/17 2258 06/14/17 0718 06/16/17 0430  WBC 4.5 4.9 5.5  NEUTROABS 2.9  --   --   HGB 9.9* 11.0* 13.0  HCT 27.9* 31.9* 37.5  MCV 94.3 94.1 96.4  PLT 209 198 924   Basic Metabolic Panel:  Recent Labs Lab 06/14/17 0031 06/14/17 0718 06/15/17 0433 06/16/17 0430  NA 133* 135 131* 131*  K 2.9* 3.3* 3.4* 4.9  CL 97* 100* 96* 97*  CO2 23 22 23 24   GLUCOSE 71 69 92 82  BUN 18 15 14 13   CREATININE 1.49* 1.02* 0.75 0.84  CALCIUM 8.0* 8.2* 8.7* 9.0  MG  --  2.1  --   --    GFR: Estimated Creatinine Clearance: 55.4 mL/min (by C-G formula  based on SCr of 0.84 mg/dL). Liver Function Tests:  Recent Labs Lab 06/14/17 0031  AST 55*  ALT 18  ALKPHOS 128*  BILITOT 0.7  PROT 6.6  ALBUMIN 4.0    Recent Labs Lab 06/14/17 0031  LIPASE 29   No results for input(s): AMMONIA in the last 168 hours. Coagulation Profile: No results for input(s): INR, PROTIME in the last 168 hours. Cardiac Enzymes: No results for input(s): CKTOTAL, CKMB, CKMBINDEX, TROPONINI in the last 168 hours. BNP (last 3 results) No results for input(s): PROBNP in the last 8760 hours. HbA1C: No results for input(s): HGBA1C in the last 72 hours. CBG:  Recent Labs Lab 06/13/17 2159 06/14/17 0748 06/16/17 0743 06/17/17 0943  GLUCAP 96 71 80 95   Lipid Profile: No results for input(s): CHOL, HDL, LDLCALC, TRIG, CHOLHDL, LDLDIRECT in the last 72 hours. Thyroid Function Tests: No results for input(s): TSH, T4TOTAL, FREET4, T3FREE, THYROIDAB in the last 72 hours. Anemia Panel:  Recent Labs  06/15/17 0433  VITAMINB12 309  FOLATE 17.2  FERRITIN 112  TIBC 364  IRON 119  RETICCTPCT 2.4   Sepsis Labs: No results for input(s): PROCALCITON, LATICACIDVEN in the last 168 hours.  Recent Results (from the past 240 hour(s))  Culture, blood (Routine X 2) w Reflex to ID Panel     Status: None (Preliminary result)   Collection Time: 06/14/17  7:18 AM  Result Value Ref Range Status   Specimen Description BLOOD RIGHT ARM  Final   Special Requests IN PEDIATRIC BOTTLE Blood Culture adequate volume  Final   Culture   Final    NO GROWTH 3 DAYS Performed at [facility], [address], [place], [place] [address]    Report Status PENDING  Incomplete  Culture, blood (Routine X 2) w Reflex to ID Panel     Status: None (Preliminary result)   Collection Time: 06/14/17  7:24 AM  Result Value Ref Range Status   Specimen Description BLOOD RIGHT HAND  Final   Special Requests IN PEDIATRIC BOTTLE Blood Culture adequate volume  Final   Culture   Final     NO GROWTH 3 DAYS Performed at [facility], [place] [address] [place], [place] [address]    Report Status PENDING  Incomplete      Radiology Studies: Ct Chest W Contrast  Result Date: 06/17/2017 CLINICAL DATA:  Followup metastatic melanoma. EXAM: CT CHEST, ABDOMEN, AND PELVIS WITH CONTRAST TECHNIQUE: Multidetector CT imaging of the chest, abdomen and pelvis was performed following the standard protocol during bolus administration of intravenous contrast. CONTRAST:  123mL ISOVUE-300 IOPAMIDOL (ISOVUE-300) INJECTION 61% COMPARISON:  PET-CT  from 11/10/2015 FINDINGS: CT CHEST FINDINGS Cardiovascular: The heart size is normal.  No pericardial effusion. Mediastinum/Nodes: The trachea appears patent and midline. Large hiatal hernia identified. There is no mediastinal or hilar adenopathy. No axillary or supraclavicular adenopathy. Lungs/Pleura: Small right pleural effusion. Moderate to advanced changes of centrilobular and paraseptal emphysema identified. Left upper lobe pulmonary nodule measures 4 mm, image 39 of series 6. New from previous exam. Subsegmental atelectasis within the posteromedial right lower lobe. Musculoskeletal: Soft tissue expansion involving the proximal body of sternum and sternomanubrial joint measures 4 x 3.0 x 4.5 cm. There is associated fracture with Manage displacement of bone fragment from the proximal body of sternum, image 86 of series 5. Multiple chronic appearing fracture deformities involve bilateral ribs. CT ABDOMEN PELVIS FINDINGS Hepatobiliary: No focal liver abnormality is seen. No gallstones, gallbladder wall thickening, or biliary dilatation. Pancreas: Unremarkable. No pancreatic ductal dilatation or surrounding inflammatory changes. Spleen: Normal in size without focal abnormality. Adrenals/Urinary Tract: The adrenal glands are normal. There is a small low density structure arising from the posterior cortex of the right kidney measuring 6 mm. Too small to characterize.  No mass or hydronephrosis identified. Urinary bladder is unremarkable. Stomach/Bowel: At least 50% intrathoracic stomach is associated with hiatal hernia. The small bowel loops have a normal course and caliber. No pathologic dilatation of the colon. Vascular/Lymphatic: Aortic atherosclerosis. No aneurysm. No upper abdominal adenopathy. There is no pelvic or inguinal adenopathy identified. Reproductive: Uterus and bilateral adnexa are unremarkable. Other: There is no free fluid or fluid collections within the abdomen or pelvis. Small periumbilical hernia is noted which contains fat only. Musculoskeletal: There are 2 syndesmotic screws reducing bilateral sacral insufficiency fractures. There is a fracture deformity involving the left superior and inferior pubic rami, image number 104 of series 2 and image 107 of series 2. Age indeterminate fracture through the mid L2 vertebral body without significant loss of vertebral body height. IMPRESSION: 1. There is a small pulmonary nodule within the left upper lobe, indeterminate measuring 4 mm. 2. There are multiple bilateral rib fracture deformities as well as fractures involving the left superior and inferior pubic. Bilateral sacral insufficiency fractures are noted and have been treated with syndesmotic screws. Age indeterminate fracture deformity through the mid L2 vertebral body without significant loss vertebral body height. 3. Fracture involving the proximal body of the sternum is identified with ventral displacement of fracture fragment. There is masslike enlargement of the surrounding soft tissues. Cannot rule out metastatic disease with associated pathologic fracture in this patient who has a history of metastatic melanoma. Consider correlation with tissue sampling. 4. Large hiatal hernia. 5.  Emphysema (ICD10-J43.9). Aortic aneurysm NOS (ICD10-I71.9). Electronically Signed   By: Kerby Moors M.D.   On: 06/17/2017 15:12   Ct Abdomen Pelvis W Contrast  Result  Date: 06/17/2017 CLINICAL DATA:  Followup metastatic melanoma. EXAM: CT CHEST, ABDOMEN, AND PELVIS WITH CONTRAST TECHNIQUE: Multidetector CT imaging of the chest, abdomen and pelvis was performed following the standard protocol during bolus administration of intravenous contrast. CONTRAST:  167mL ISOVUE-300 IOPAMIDOL (ISOVUE-300) INJECTION 61% COMPARISON:  PET-CT from 11/10/2015 FINDINGS: CT CHEST FINDINGS Cardiovascular: The heart size is normal.  No pericardial effusion. Mediastinum/Nodes: The trachea appears patent and midline. Large hiatal hernia identified. There is no mediastinal or hilar adenopathy. No axillary or supraclavicular adenopathy. Lungs/Pleura: Small right pleural effusion. Moderate to advanced changes of centrilobular and paraseptal emphysema identified. Left upper lobe pulmonary nodule measures 4 mm, image 39 of series 6. New from previous  exam. Subsegmental atelectasis within the posteromedial right lower lobe. Musculoskeletal: Soft tissue expansion involving the proximal body of sternum and sternomanubrial joint measures 4 x 3.0 x 4.5 cm. There is associated fracture with Manage displacement of bone fragment from the proximal body of sternum, image 86 of series 5. Multiple chronic appearing fracture deformities involve bilateral ribs. CT ABDOMEN PELVIS FINDINGS Hepatobiliary: No focal liver abnormality is seen. No gallstones, gallbladder wall thickening, or biliary dilatation. Pancreas: Unremarkable. No pancreatic ductal dilatation or surrounding inflammatory changes. Spleen: Normal in size without focal abnormality. Adrenals/Urinary Tract: The adrenal glands are normal. There is a small low density structure arising from the posterior cortex of the right kidney measuring 6 mm. Too small to characterize. No mass or hydronephrosis identified. Urinary bladder is unremarkable. Stomach/Bowel: At least 50% intrathoracic stomach is associated with hiatal hernia. The small bowel loops have a normal  course and caliber. No pathologic dilatation of the colon. Vascular/Lymphatic: Aortic atherosclerosis. No aneurysm. No upper abdominal adenopathy. There is no pelvic or inguinal adenopathy identified. Reproductive: Uterus and bilateral adnexa are unremarkable. Other: There is no free fluid or fluid collections within the abdomen or pelvis. Small periumbilical hernia is noted which contains fat only. Musculoskeletal: There are 2 syndesmotic screws reducing bilateral sacral insufficiency fractures. There is a fracture deformity involving the left superior and inferior pubic rami, image number 104 of series 2 and image 107 of series 2. Age indeterminate fracture through the mid L2 vertebral body without significant loss of vertebral body height. IMPRESSION: 1. There is a small pulmonary nodule within the left upper lobe, indeterminate measuring 4 mm. 2. There are multiple bilateral rib fracture deformities as well as fractures involving the left superior and inferior pubic. Bilateral sacral insufficiency fractures are noted and have been treated with syndesmotic screws. Age indeterminate fracture deformity through the mid L2 vertebral body without significant loss vertebral body height. 3. Fracture involving the proximal body of the sternum is identified with ventral displacement of fracture fragment. There is masslike enlargement of the surrounding soft tissues. Cannot rule out metastatic disease with associated pathologic fracture in this patient who has a history of metastatic melanoma. Consider correlation with tissue sampling. 4. Large hiatal hernia. 5.  Emphysema (ICD10-J43.9). Aortic aneurysm NOS (ICD10-I71.9). Electronically Signed   By: Kerby Moors M.D.   On: 06/17/2017 15:12      Scheduled Meds: . cefUROXime  250 mg Oral BID WC  . chlordiazePOXIDE  25 mg Oral TID   Followed by  . [START ON 06/18/2017] chlordiazePOXIDE  25 mg Oral Q12H  . cholecalciferol  1,000 Units Oral Daily  . folic acid  1 mg  Oral Daily  . iopamidol      . levothyroxine  100 mcg Oral QAC breakfast  . loratadine  10 mg Oral Daily  . multivitamin with minerals  1 tablet Oral Daily  . nicotine  21 mg Transdermal Daily  . pantoprazole  40 mg Oral BID  . sodium chloride flush  3 mL Intravenous Q12H  . thiamine  100 mg Oral Daily   Or  . thiamine  100 mg Intravenous Daily   Continuous Infusions:   LOS: 2 days    Time spent: Total of 15 minutes spent with pt, greater than 50% of which was spent in discussion of  treatment, counseling and coordination of care    Chipper Oman, MD Pager: Text Page via [email or web address]  [phone]  If 7PM-7AM, please contact night-coverage [email or web address] Password [facility] 06/17/2017, 3:54 PM

## 2017-06-17 NOTE — Progress Notes (Signed)
CSW met with patient at bedside to discuss discharge plan and SNF offers. Patient reports she unsure if she wants to go rehab. Patient inquired about "A nurse that could stay with me all the time at home ". CSW explain private home duty agencies. Patient inquired about HomeHealth agencies. Patient then asked CSW to call for nurse to take food tray because she was starting to feel sick and asked CSW to return later. Patient was then taken down for CT scan.  CSW spoke with nurse caring for patient, she reports the patient is confused at times. She does not feel it is a safe discharge for patient to go home.  CSW asked nurse to have Physical Therapy return to help determine best disposition for patient. Physician aware.   CSW will continue to assist with discharge.   Kathrin Greathouse, Latanya Presser, MSW Clinical Social Worker 5E and Psychiatric Service Line [phone] 06/17/2017  3:10 PM

## 2017-06-17 NOTE — Consult Note (Signed)
As above  Recent Labs  06/15/17 0433 06/16/17 0430  NA 131* 131*  K 3.4* 4.9  CL 96* 97*  CO2 23 24  GLUCOSE 92 82  BUN 14 13  CREATININE 0.75 0.84  CALCIUM 8.7* 9.0    Blood smear review:  none   Pathology: None     Assessment and Plan:  Susan Mccarthy is a very nice 66 year old white female. She has a history of metastatic melanoma. There also is a history of thyroid cancer area and as far as I can tell, I don't see any evidence of recurrent disease.  I do feel bad for her. I know she does have a very stressful home situation. I will see, and most of restate "tipping point" and she began to drink.  Another she has had some psychological issues. Every now and then when she would call our office, she would be very paranoid and would be somewhat verbally abusive to our staff. Then she would apologize.  While she is in the hospital, it would be nice to at least get some staging studies on her. I think CT scan would be reasonable. We cannot do a PET scan. We have monitored her with PET scans in the past..  The main issue right now is just getting her personal life back in order. It sounds like her going to some nursing facility might be best. I'm unsure what kind of relationship she really has with her family in [place] or down here.  I'm not sure we need to get her back on to [place]. A lot will depend on what we find with our scans.  I will pray hard for her. I do feel bad that she has decompensated like this.  As always, I very much appreciate the wonderful care that she is getting from everybody up on [address].  Susan Haw, MD  Psalms 56:3-4

## 2017-06-17 NOTE — Care Management Note (Signed)
Case Management Note  Patient Details  Name: Susan Mccarthy MRN: [id number] Date of Birth: Jul 20, 1951  Subjective/Objective: 66 y/o f admitted w/AKI. From home. Hx: etoh. PT-to re eval-await recc. CSW following.                   Action/Plan:d/c home.   Expected Discharge Date:   (unknown)               Expected Discharge Plan:  [place]  In-House Referral:  Clinical Social Work  Discharge planning Services  CM Consult  Post Acute Care Choice:    Choice offered to:     DME Arranged:    DME Agency:     HH Arranged:    [place] Agency:     Status of Service:  In process, will continue to follow  If discussed at Long Length of Stay Meetings, dates discussed:    Additional Comments:  Dessa Phi, RN 06/17/2017, 3:52 PM

## 2017-06-18 LAB — BASIC METABOLIC PANEL
Anion gap: 9 (ref 5–15)
BUN: 15 mg/dL (ref 6–20)
CHLORIDE: 95 mmol/L — AB (ref 101–111)
CO2: 23 mmol/L (ref 22–32)
Calcium: 8.7 mg/dL — ABNORMAL LOW (ref 8.9–10.3)
Creatinine, Ser: 0.99 mg/dL (ref 0.44–1.00)
GFR calc Af Amer: >60 mL/min (ref >60)
GFR calc non Af Amer: 57 mL/min — ABNORMAL LOW (ref >60)
Glucose, Bld: 93 mg/dL (ref 65–99)
POTASSIUM: 3.7 mmol/L (ref 3.5–5.1)
SODIUM: 127 mmol/L — AB (ref 135–145)

## 2017-06-18 LAB — GLUCOSE, CAPILLARY: Glucose-Capillary: 108 mg/dL — ABNORMAL HIGH (ref 65–99)

## 2017-06-18 MED ORDER — LEVOTHYROXINE SODIUM 100 MCG PO TABS: 100.0000 ug | ORAL_TABLET | Freq: Every day | ORAL | 0 refills | Status: DC

## 2017-06-18 MED ORDER — FOLIC ACID 1 MG PO TABS: 1.0000 mg | ORAL_TABLET | Freq: Every day | ORAL | 0 refills | Status: DC

## 2017-06-18 MED ORDER — THIAMINE HCL 100 MG/ML IJ SOLN: 100.0000 mg | Freq: Every day | INTRAMUSCULAR | 0 refills | Status: DC

## 2017-06-18 MED ORDER — CHLORDIAZEPOXIDE HCL 25 MG PO CAPS: ORAL_CAPSULE | ORAL | 0 refills | Status: DC

## 2017-06-18 MED ORDER — CEFUROXIME AXETIL 250 MG PO TABS: 250.0000 mg | ORAL_TABLET | Freq: Two times a day (BID) | ORAL | 0 refills | Status: AC

## 2017-06-18 MED ORDER — ADULT MULTIVITAMIN W/MINERALS CH: 1.0000 | ORAL_TABLET | Freq: Every day | ORAL | Status: DC

## 2017-06-18 NOTE — Progress Notes (Signed)
[facility] selected for [facility] needs. Referral given to in house rep.

## 2017-06-18 NOTE — Progress Notes (Signed)
CSW notified by patient's RN that patient needs transportation to return home. Patient's RN reported that patient's ride is not answering, patient does not have another mode of transportation or any money. Patient reported that her address is [place], [place]. Patient reported that CSW could contact Apartment staff member Jenny Reichmann to verify patient's address. Staff member Jenny Reichmann verified patient's address. CSW provided taxi voucher and coordinated transportation for patient.   Abundio Miu, [place] Social Worker [facility] Cell#: [phone]

## 2017-06-18 NOTE — Discharge Summary (Signed)
Physician Discharge Summary  Susan Mccarthy  BOF:[id number]  DOB: 02-Mar-1951  DOA: 06/13/2017 PCP: Patient, No Pcp Per  Admit date: 06/13/2017 Discharge date: 06/18/2017  Admitted From: Home  Disposition:  Home   Recommendations for Outpatient Follow-up:  1. Follow up with PCP on Thur June 20, 2017 2. Please obtain BMP/CBC in week to monitor Hgb and renal function  3. Patient needs PET scan 4. May need further investigation on sternum fracture with outpatient biopsy to r/o pathologic involvement vs trauma  5. Need to repeat TSH in 6 weeks and adjust Synthroid as needed   Home Health: PT, RN, SW and Nurse aide Equipment/Devices: Rolling walker   Discharge Condition: Improved  CODE STATUS: FULL  Diet recommendation: Heart Healthy   Brief/Interim Summary: Patient is a 66 year old female with medical history significant for GERD, hyperthyroidism secondary to thyroidectomy due to thyroid cancer, depression, anxiety, polysubstance abuse and bipolar disorder. Presented to the emergency department with generalized body aches and alcohol intoxication. Patient was admitted for hypotensive episode, awaiting sobriety and monitor for alcohol withdrawal. Hospital course has been complicated with agitation and UTI. Patient was started on Librium taper. She was placed on Ceftin for UTI. During hospital stay she was evaluated by PT whom recommended STR but subsequent evaluation recommended HH PT. Patient was counseled on alcohol cessation and home safety by Education officer, museum. Patient has clinically improve and she will be discharge home with RN. PT and SW for social support.   Subjective: Patient seen and examined on the day of discharge, she is doing well, has no complaints. Only tender to palpation on her rib cage. Patient afebrile, denies hallucinations, no tremors or headaches.   Discharge Diagnoses/Hospital Course:  Acute renal failure - resolved  Secondary to dehydration Treated with IVF  Check BMP  in 1 week   Alcohol intoxication with alcohol withdrawal  On Librium taper, patient report interest on quitting alcohol will d/c on taper for 2 more days  Patient advised to joint [facility] for alcohol rehab  Monitor as outpatient my benefit from Vivitrol  UTI  UA consistent with UTI + nitrates and high WBC's  this could be exacerbating her symptoms of confusion  Treated with Ceftin, as she didn't have IV access, will continue for 4 more days total of 7 days  Urine cultures were cancelled by the lab, unknown why. Will follow clinically  Follow with PCP to repeat UA   Severe hypothyroidism Patient was not taking her home dose Synthroid. This medication was resumed on admission will need to recheck TSH in 6 weeks. Last TSH about 2 months ago was 100 which is trending down  Hyponatremia likely related to hypothyroidism Continue to monitor  Hypokalemia - resolved  Repleted  Check BMP in 1 week   Normocytic anemia due to hemodilution now Hgb back to normal levels  Resolved   Hx of metastatic melanoma  Oncology recommendation appreciated  CT with multiple fractures unclear if they are pathologic from malignancy vs trauma - Dicussed with Dr Jonette Eva, this could be workup as outpatient. He will obtain PET as OP. Further recommendation per oncology team.   Deconditioning  Due to EtOH abuse, malignancy and poor nutrition  PT at home   All other chronic medical condition were stable during the hospitalization.  Patient was seen by physical therapy, recommending HH PT  On the day of the discharge the patient's vitals were stable, and no other acute medical condition were reported by patient. Patient was felt safe to  be discharge to home   Discharge Instructions  You were cared for by a hospitalist during your hospital stay. If you have any questions about your discharge medications or the care you received while you were in the hospital after you are discharged, you can call the unit and  asked to speak with the hospitalist on call if the hospitalist that took care of you is not available. Once you are discharged, your primary care physician will handle any further medical issues. Please note that NO REFILLS for any discharge medications will be authorized once you are discharged, as it is imperative that you return to your primary care physician (or establish a relationship with a primary care physician if you do not have one) for your aftercare needs so that they can reassess your need for medications and monitor your lab values.  Discharge Instructions    Call MD for:  difficulty breathing, headache or visual disturbances    Complete by:  As directed    Call MD for:  extreme fatigue    Complete by:  As directed    Call MD for:  hives    Complete by:  As directed    Call MD for:  persistant dizziness or light-headedness    Complete by:  As directed    Call MD for:  persistant nausea and vomiting    Complete by:  As directed    Call MD for:  redness, tenderness, or signs of infection (pain, swelling, redness, odor or green/yellow discharge around incision site)    Complete by:  As directed    Call MD for:  severe uncontrolled pain    Complete by:  As directed    Call MD for:  temperature >100.4    Complete by:  As directed    Diet - low sodium heart healthy    Complete by:  As directed    Increase activity slowly    Complete by:  As directed      Allergies as of 06/18/2017      Reactions   Other Rash, Other (See Comments)   Pt states that she is allergic to anything scented.      Medication List    STOP taking these medications   cephALEXin 500 MG capsule Commonly known as:  KEFLEX   loperamide 1 MG/5ML solution Commonly known as:  IMODIUM     TAKE these medications   cefUROXime 250 MG tablet Commonly known as:  CEFTIN Take 1 tablet (250 mg total) by mouth 2 (two) times daily with a meal.   chlordiazePOXIDE 25 MG capsule Commonly known as:  LIBRIUM Take  1 cap 3 time a day for 1 day, then take 1 cap 2 time a day for 1 day, the take 1 cap for 1 day   cholecalciferol 1000 units tablet Commonly known as:  VITAMIN D Take 1,000 Units by mouth daily.   fexofenadine-pseudoephedrine 180-240 MG 24 hr tablet Commonly known as:  ALLEGRA-D 24 Take 1 tablet by mouth daily.   folic acid 1 MG tablet Commonly known as:  FOLVITE Take 1 tablet (1 mg total) by mouth daily.   levothyroxine 100 MCG tablet Commonly known as:  SYNTHROID, LEVOTHROID Take 1 tablet (100 mcg total) by mouth daily before breakfast.   multivitamin with minerals Tabs tablet Take 1 tablet by mouth daily.   thiamine 100 MG/ML injection Commonly known as:  B-1 Inject 1 mL (100 mg total) into the vein daily.  Follow-up [facility] Follow up on 06/20/2017.   Specialty:  Internal Medicine Why:  Appointment at 10 AM on Thursday. Please keep appointment.  Contact information: [place]       Volanda Napoleon, MD. Schedule an appointment as soon as possible for a visit in 1 week(s).   Specialty:  Oncology Why:  Hospital follow up  Contact information: [address] [place] [address] [phone]          Allergies  Allergen Reactions  . Other Rash and Other (See Comments)    Pt states that she is allergic to anything scented.    Consultations:  Oncology    Procedures/Studies: Ct Chest W Contrast  Result Date: 06/17/2017 CLINICAL DATA:  Followup metastatic melanoma. EXAM: CT CHEST, ABDOMEN, AND PELVIS WITH CONTRAST TECHNIQUE: Multidetector CT imaging of the chest, abdomen and pelvis was performed following the standard protocol during bolus administration of intravenous contrast. CONTRAST:  144mL ISOVUE-300 IOPAMIDOL (ISOVUE-300) INJECTION 61% COMPARISON:  PET-CT from 11/10/2015 FINDINGS: CT CHEST FINDINGS Cardiovascular: The heart size is normal.  No  pericardial effusion. Mediastinum/Nodes: The trachea appears patent and midline. Large hiatal hernia identified. There is no mediastinal or hilar adenopathy. No axillary or supraclavicular adenopathy. Lungs/Pleura: Small right pleural effusion. Moderate to advanced changes of centrilobular and paraseptal emphysema identified. Left upper lobe pulmonary nodule measures 4 mm, image 39 of series 6. New from previous exam. Subsegmental atelectasis within the posteromedial right lower lobe. Musculoskeletal: Soft tissue expansion involving the proximal body of sternum and sternomanubrial joint measures 4 x 3.0 x 4.5 cm. There is associated fracture with Manage displacement of bone fragment from the proximal body of sternum, image 86 of series 5. Multiple chronic appearing fracture deformities involve bilateral ribs. CT ABDOMEN PELVIS FINDINGS Hepatobiliary: No focal liver abnormality is seen. No gallstones, gallbladder wall thickening, or biliary dilatation. Pancreas: Unremarkable. No pancreatic ductal dilatation or surrounding inflammatory changes. Spleen: Normal in size without focal abnormality. Adrenals/Urinary Tract: The adrenal glands are normal. There is a small low density structure arising from the posterior cortex of the right kidney measuring 6 mm. Too small to characterize. No mass or hydronephrosis identified. Urinary bladder is unremarkable. Stomach/Bowel: At least 50% intrathoracic stomach is associated with hiatal hernia. The small bowel loops have a normal course and caliber. No pathologic dilatation of the colon. Vascular/Lymphatic: Aortic atherosclerosis. No aneurysm. No upper abdominal adenopathy. There is no pelvic or inguinal adenopathy identified. Reproductive: Uterus and bilateral adnexa are unremarkable. Other: There is no free fluid or fluid collections within the abdomen or pelvis. Small periumbilical hernia is noted which contains fat only. Musculoskeletal: There are 2 syndesmotic screws  reducing bilateral sacral insufficiency fractures. There is a fracture deformity involving the left superior and inferior pubic rami, image number 104 of series 2 and image 107 of series 2. Age indeterminate fracture through the mid L2 vertebral body without significant loss of vertebral body height. IMPRESSION: 1. There is a small pulmonary nodule within the left upper lobe, indeterminate measuring 4 mm. 2. There are multiple bilateral rib fracture deformities as well as fractures involving the left superior and inferior pubic. Bilateral sacral insufficiency fractures are noted and have been treated with syndesmotic screws. Age indeterminate fracture deformity through the mid L2 vertebral body without significant loss vertebral body height. 3. Fracture involving the proximal body of the sternum is identified with ventral displacement of fracture fragment. There  is masslike enlargement of the surrounding soft tissues. Cannot rule out metastatic disease with associated pathologic fracture in this patient who has a history of metastatic melanoma. Consider correlation with tissue sampling. 4. Large hiatal hernia. 5.  Emphysema (ICD10-J43.9). Aortic aneurysm NOS (ICD10-I71.9). Electronically Signed   By: Kerby Moors M.D.   On: 06/17/2017 15:12   Ct Abdomen Pelvis W Contrast  Result Date: 06/17/2017 CLINICAL DATA:  Followup metastatic melanoma. EXAM: CT CHEST, ABDOMEN, AND PELVIS WITH CONTRAST TECHNIQUE: Multidetector CT imaging of the chest, abdomen and pelvis was performed following the standard protocol during bolus administration of intravenous contrast. CONTRAST:  139mL ISOVUE-300 IOPAMIDOL (ISOVUE-300) INJECTION 61% COMPARISON:  PET-CT from 11/10/2015 FINDINGS: CT CHEST FINDINGS Cardiovascular: The heart size is normal.  No pericardial effusion. Mediastinum/Nodes: The trachea appears patent and midline. Large hiatal hernia identified. There is no mediastinal or hilar adenopathy. No axillary or  supraclavicular adenopathy. Lungs/Pleura: Small right pleural effusion. Moderate to advanced changes of centrilobular and paraseptal emphysema identified. Left upper lobe pulmonary nodule measures 4 mm, image 39 of series 6. New from previous exam. Subsegmental atelectasis within the posteromedial right lower lobe. Musculoskeletal: Soft tissue expansion involving the proximal body of sternum and sternomanubrial joint measures 4 x 3.0 x 4.5 cm. There is associated fracture with Manage displacement of bone fragment from the proximal body of sternum, image 86 of series 5. Multiple chronic appearing fracture deformities involve bilateral ribs. CT ABDOMEN PELVIS FINDINGS Hepatobiliary: No focal liver abnormality is seen. No gallstones, gallbladder wall thickening, or biliary dilatation. Pancreas: Unremarkable. No pancreatic ductal dilatation or surrounding inflammatory changes. Spleen: Normal in size without focal abnormality. Adrenals/Urinary Tract: The adrenal glands are normal. There is a small low density structure arising from the posterior cortex of the right kidney measuring 6 mm. Too small to characterize. No mass or hydronephrosis identified. Urinary bladder is unremarkable. Stomach/Bowel: At least 50% intrathoracic stomach is associated with hiatal hernia. The small bowel loops have a normal course and caliber. No pathologic dilatation of the colon. Vascular/Lymphatic: Aortic atherosclerosis. No aneurysm. No upper abdominal adenopathy. There is no pelvic or inguinal adenopathy identified. Reproductive: Uterus and bilateral adnexa are unremarkable. Other: There is no free fluid or fluid collections within the abdomen or pelvis. Small periumbilical hernia is noted which contains fat only. Musculoskeletal: There are 2 syndesmotic screws reducing bilateral sacral insufficiency fractures. There is a fracture deformity involving the left superior and inferior pubic rami, image number 104 of series 2 and image 107 of  series 2. Age indeterminate fracture through the mid L2 vertebral body without significant loss of vertebral body height. IMPRESSION: 1. There is a small pulmonary nodule within the left upper lobe, indeterminate measuring 4 mm. 2. There are multiple bilateral rib fracture deformities as well as fractures involving the left superior and inferior pubic. Bilateral sacral insufficiency fractures are noted and have been treated with syndesmotic screws. Age indeterminate fracture deformity through the mid L2 vertebral body without significant loss vertebral body height. 3. Fracture involving the proximal body of the sternum is identified with ventral displacement of fracture fragment. There is masslike enlargement of the surrounding soft tissues. Cannot rule out metastatic disease with associated pathologic fracture in this patient who has a history of metastatic melanoma. Consider correlation with tissue sampling. 4. Large hiatal hernia. 5.  Emphysema (ICD10-J43.9). Aortic aneurysm NOS (ICD10-I71.9). Electronically Signed   By: Kerby Moors M.D.   On: 06/17/2017 15:12    Discharge Exam: Vitals:   06/18/17 2229 06/18/17 1403  BP: 112/70 118/75  Pulse: 69 72  Resp: 18 18  Temp: (!) 96.3 F (35.7 C) 97.7 F (36.5 C)   Vitals:   06/17/17 2052 06/18/17 0008 06/18/17 0438 06/18/17 1403  BP: 106/81 104/68 112/70 118/75  Pulse: 73 69 69 72  Resp: 18 18 18 18   Temp: 98.2 F (36.8 C) 97.8 F (36.6 C) (!) 96.3 F (35.7 C) 97.7 F (36.5 C)  TempSrc: Oral Oral Axillary Oral  SpO2: 98% 100% 95% 96%  Weight:      Height:        General: Pt is alert, awake, not in acute distress Cardiovascular: RRR, S1/S2 +, no rubs, no gallops Respiratory: CTA bilaterally, no wheezing, no rhonchi Abdominal: Soft, NT, ND, bowel sounds + Extremities: no edema, no cyanosis  The results of significant diagnostics from this hospitalization (including imaging, microbiology, ancillary and laboratory) are listed below for  reference.     Microbiology: Recent Results (from the past 240 hour(s))  Culture, blood (Routine X 2) w Reflex to ID Panel     Status: None (Preliminary result)   Collection Time: 06/14/17  7:18 AM  Result Value Ref Range Status   Specimen Description BLOOD RIGHT ARM  Final   Special Requests IN PEDIATRIC BOTTLE Blood Culture adequate volume  Final   Culture   Final    NO GROWTH 3 DAYS Performed at [facility], [address], [place], [place] [address]    Report Status PENDING  Incomplete  Culture, blood (Routine X 2) w Reflex to ID Panel     Status: None (Preliminary result)   Collection Time: 06/14/17  7:24 AM  Result Value Ref Range Status   Specimen Description BLOOD RIGHT HAND  Final   Special Requests IN PEDIATRIC BOTTLE Blood Culture adequate volume  Final   Culture   Final    NO GROWTH 3 DAYS Performed at [facility], [place] [address] [place], [place] [address]    Report Status PENDING  Incomplete     Labs: BNP (last 3 results) No results for input(s): BNP in the last 8760 hours. Basic Metabolic Panel:  Recent Labs Lab 06/14/17 0031 06/14/17 0718 06/15/17 0433 06/16/17 0430 06/18/17 0424  NA 133* 135 131* 131* 127*  K 2.9* 3.3* 3.4* 4.9 3.7  CL 97* 100* 96* 97* 95*  CO2 23 22 23 24 23   GLUCOSE 71 69 92 82 93  BUN 18 15 14 13 15   CREATININE 1.49* 1.02* 0.75 0.84 0.99  CALCIUM 8.0* 8.2* 8.7* 9.0 8.7*  MG  --  2.1  --   --   --    Liver Function Tests:  Recent Labs Lab 06/14/17 0031  AST 55*  ALT 18  ALKPHOS 128*  BILITOT 0.7  PROT 6.6  ALBUMIN 4.0    Recent Labs Lab 06/14/17 0031  LIPASE 29   No results for input(s): AMMONIA in the last 168 hours. CBC:  Recent Labs Lab 06/13/17 2258 06/14/17 0718 06/16/17 0430  WBC 4.5 4.9 5.5  NEUTROABS 2.9  --   --   HGB 9.9* 11.0* 13.0  HCT 27.9* 31.9* 37.5  MCV 94.3 94.1 96.4  PLT 209 198 194   Cardiac Enzymes: No results for input(s): CKTOTAL, CKMB, CKMBINDEX, TROPONINI in the  last 168 hours. BNP: Invalid input(s): POCBNP CBG:  Recent Labs Lab 06/13/17 2159 06/14/17 0748 06/16/17 0743 06/17/17 0943 06/18/17 0738  GLUCAP 96 71 80 95 108*   D-Dimer No results for  input(s): DDIMER in the last 72 hours. Hgb A1c No results for input(s): HGBA1C in the last 72 hours. Lipid Profile No results for input(s): CHOL, HDL, LDLCALC, TRIG, CHOLHDL, LDLDIRECT in the last 72 hours. Thyroid function studies No results for input(s): TSH, T4TOTAL, T3FREE, THYROIDAB in the last 72 hours.  Invalid input(s): FREET3 Anemia work up No results for input(s): VITAMINB12, FOLATE, FERRITIN, TIBC, IRON, RETICCTPCT in the last 72 hours. Urinalysis    Component Value Date/Time   COLORURINE YELLOW 06/15/2017 1830   APPEARANCEUR CLOUDY (A) 06/15/2017 1830   LABSPEC 1.015 06/15/2017 1830   PHURINE 7.0 06/15/2017 1830   GLUCOSEU NEGATIVE 06/15/2017 1830   GLUCOSEU NEGATIVE 08/22/2015 1654   HGBUR SMALL (A) 06/15/2017 1830   BILIRUBINUR NEGATIVE 06/15/2017 1830   KETONESUR 5 (A) 06/15/2017 1830   PROTEINUR 30 (A) 06/15/2017 1830   UROBILINOGEN 0.2 08/22/2015 1654   NITRITE POSITIVE (A) 06/15/2017 1830   LEUKOCYTESUR LARGE (A) 06/15/2017 1830   Sepsis Labs Invalid input(s): PROCALCITONIN,  WBC,  LACTICIDVEN Microbiology Recent Results (from the past 240 hour(s))  Culture, blood (Routine X 2) w Reflex to ID Panel     Status: None (Preliminary result)   Collection Time: 06/14/17  7:18 AM  Result Value Ref Range Status   Specimen Description BLOOD RIGHT ARM  Final   Special Requests IN PEDIATRIC BOTTLE Blood Culture adequate volume  Final   Culture   Final    NO GROWTH 3 DAYS Performed at [facility], [place] [address] [place], [place] [address]    Report Status PENDING  Incomplete  Culture, blood (Routine X 2) w Reflex to ID Panel     Status: None (Preliminary result)   Collection Time: 06/14/17  7:24 AM  Result Value Ref Range Status   Specimen Description BLOOD  RIGHT HAND  Final   Special Requests IN PEDIATRIC BOTTLE Blood Culture adequate volume  Final   Culture   Final    NO GROWTH 3 DAYS Performed at [facility], [place] [address] [place], [place] [address]    Report Status PENDING  Incomplete     Time coordinating discharge: 35 minutes  SIGNED:  Chipper Oman, MD  [facility] 06/18/2017, 4:10 PM  Pager please text page via  [email or web address] Password [email or web address]

## 2017-06-18 NOTE — Progress Notes (Signed)
Pt was released from [facility] PCP. Pt states that she use Dr. Marin Olp, however pt need a PCP. An appointment was made at [facility] for Thurs at 10 AM. Pt aware and states that she will keep. Pt is aware that she was released from [facility].

## 2017-06-18 NOTE — Progress Notes (Signed)
Physical Therapy Treatment Patient Details Name: Susan Mccarthy MRN: [id number] DOB: Jul 03, 1951 Today's Date: 06/18/2017    History of Present Illness 66 yo female with histor signifincant for GERD, Hyopthyroidism, biplar disorder admitted with intoxication and acute kidney injury      PT Comments    Pt tolerated increased ambulation distance of 41' using RW with no loss of balance. Supervision for mobility recommended due to pt's decreased safety awareness, however, per CM, pt doesn't have assistance available at home.    Follow Up Recommendations  Home health PT (per CM, pt is from home alone (not from ALF as indicated in PT eval), and doesn't qualify for SNF); supervision for mobility     Equipment Recommendations  Rolling walker with 5" wheels (pt stated she had a RW but lost it)    Recommendations for Other Services       Precautions / Restrictions Precautions Precautions: Fall Restrictions Weight Bearing Restrictions: No    Mobility  Bed Mobility   Bed Mobility: Supine to Sit     Supine to sit: Min assist     General bed mobility comments: min A to raise trunk  Transfers Overall transfer level: Needs assistance Equipment used: Rolling walker (2 wheeled) Transfers: Sit to/from Stand Sit to Stand: Min guard         General transfer comment: VCs for hand placement  Ambulation/Gait Ambulation/Gait assistance: Min guard Ambulation Distance (Feet): 80 Feet Assistive device: Rolling walker (2 wheeled) Gait Pattern/deviations: Decreased step length - right;Decreased step length - left Gait velocity: decreased (decreased)   General Gait Details: steady, no LOB, at one point pt completely let go of RW when she was making 180* turn, when cued to hold onto RW she stated she has her "own plan"   Stairs            Wheelchair Mobility    Modified Rankin (Stroke Patients Only)       Balance Overall balance assessment: Needs assistance   Sitting  balance-Leahy Scale: Good       Standing balance-Leahy Scale: Fair                              Cognition Arousal/Alertness: Awake/alert Behavior During Therapy: WFL for tasks assessed/performed Overall Cognitive Status: No family/caregiver present to determine baseline cognitive functioning                                        Exercises      General Comments        Pertinent Vitals/Pain Pain Assessment: 0-10 Pain Score: 3  Pain Location: coccyx Pain Descriptors / Indicators: Sore Pain Intervention(s): Limited activity within patient's tolerance;Monitored during session    Home Living                      Prior Function            PT Goals (current goals can now be found in the care plan section) Acute Rehab PT Goals Patient Stated Goal: none stated  PT Goal Formulation: Patient unable to participate in goal setting Potential to Achieve Goals: Fair Progress towards PT goals: Progressing toward goals    Frequency    Min 3X/week      PT Plan Discharge plan needs to be updated    Co-evaluation  AM-PAC PT "6 Clicks" Daily Activity  Outcome Measure  Difficulty turning over in bed (including adjusting bedclothes, sheets and blankets)?: A Little Difficulty moving from lying on back to sitting on the side of the bed? : Total Difficulty sitting down on and standing up from a chair with arms (e.g., wheelchair, bedside commode, etc,.)?: A Little Help needed moving to and from a bed to chair (including a wheelchair)?: A Little Help needed walking in hospital room?: A Little Help needed climbing 3-5 steps with a railing? : A Little 6 Click Score: 16    End of Session Equipment Utilized During Treatment: Gait belt Activity Tolerance: Patient tolerated treatment well (limited by risk of agitation ) Patient left: in chair;with chair alarm set;with call bell/phone within reach Nurse Communication: Mobility  status PT Visit Diagnosis: Unsteadiness on feet (R26.81);Muscle weakness (generalized) (M62.81);Difficulty in walking, not elsewhere classified (R26.2)     Time: 1771-1657 PT Time Calculation (min) (ACUTE ONLY): 14 min  Charges:  $Gait Training: 8-22 mins                    G Codes:          Philomena Doheny 06/18/2017, 12:25 PM [phone]

## 2017-06-19 ENCOUNTER — Encounter (HOSPITAL_COMMUNITY): Payer: Self-pay | Admitting: *Deleted

## 2017-06-19 ENCOUNTER — Emergency Department (HOSPITAL_COMMUNITY): Payer: BLUE CROSS/BLUE SHIELD

## 2017-06-19 ENCOUNTER — Emergency Department (HOSPITAL_COMMUNITY)
Admission: EM | Admit: 2017-06-19 | Discharge: 2017-06-20 | Disposition: A | Discharge status: Home / Self Care | Payer: BLUE CROSS/BLUE SHIELD | Source: Home / Self Care | Attending: Emergency Medicine | Admitting: Emergency Medicine

## 2017-06-19 ENCOUNTER — Encounter (HOSPITAL_COMMUNITY): Payer: Self-pay | Admitting: Emergency Medicine

## 2017-06-19 ENCOUNTER — Emergency Department (HOSPITAL_COMMUNITY)
Admission: EM | Admit: 2017-06-19 | Discharge: 2017-06-19 | Disposition: A | Discharge status: Home / Self Care | Payer: BLUE CROSS/BLUE SHIELD | Attending: Emergency Medicine | Admitting: Emergency Medicine

## 2017-06-19 DIAGNOSIS — R52 Pain, unspecified: Secondary | ICD-10-CM | POA: Insufficient documentation

## 2017-06-19 DIAGNOSIS — Z85850 Personal history of malignant neoplasm of thyroid: Secondary | ICD-10-CM | POA: Insufficient documentation

## 2017-06-19 DIAGNOSIS — C439 Malignant melanoma of skin, unspecified: Secondary | ICD-10-CM

## 2017-06-19 DIAGNOSIS — R4182 Altered mental status, unspecified: Secondary | ICD-10-CM

## 2017-06-19 DIAGNOSIS — Z79899 Other long term (current) drug therapy: Secondary | ICD-10-CM | POA: Insufficient documentation

## 2017-06-19 DIAGNOSIS — F17210 Nicotine dependence, cigarettes, uncomplicated: Secondary | ICD-10-CM | POA: Insufficient documentation

## 2017-06-19 DIAGNOSIS — Y907 Blood alcohol level of 200-239 mg/100 ml: Secondary | ICD-10-CM | POA: Insufficient documentation

## 2017-06-19 DIAGNOSIS — F1010 Alcohol abuse, uncomplicated: Secondary | ICD-10-CM

## 2017-06-19 DIAGNOSIS — E039 Hypothyroidism, unspecified: Secondary | ICD-10-CM | POA: Insufficient documentation

## 2017-06-19 DIAGNOSIS — F10229 Alcohol dependence with intoxication, unspecified: Secondary | ICD-10-CM | POA: Insufficient documentation

## 2017-06-19 LAB — RAPID URINE DRUG SCREEN, HOSP PERFORMED
AMPHETAMINES: NOT DETECTED
Barbiturates: NOT DETECTED
Benzodiazepines: POSITIVE — AB
Cocaine: NOT DETECTED
OPIATES: NOT DETECTED
TETRAHYDROCANNABINOL: NOT DETECTED

## 2017-06-19 LAB — URINALYSIS, ROUTINE W REFLEX MICROSCOPIC
BILIRUBIN URINE: NEGATIVE
GLUCOSE, UA: NEGATIVE mg/dL
HGB URINE DIPSTICK: NEGATIVE
Ketones, ur: NEGATIVE mg/dL
Leukocytes, UA: NEGATIVE
Nitrite: NEGATIVE
Protein, ur: NEGATIVE mg/dL
SPECIFIC GRAVITY, URINE: 1.006 (ref 1.005–1.030)
pH: 7 (ref 5.0–8.0)

## 2017-06-19 LAB — CBC WITH DIFFERENTIAL/PLATELET
BASOS ABS: 0 10*3/uL (ref 0.0–0.1)
Basophils Relative: 0 %
Eosinophils Absolute: 0.2 10*3/uL (ref 0.0–0.7)
Eosinophils Relative: 3 %
HEMATOCRIT: 32.6 % — AB (ref 36.0–46.0)
HEMOGLOBIN: 11.2 g/dL — AB (ref 12.0–15.0)
LYMPHS ABS: 1.2 10*3/uL (ref 0.7–4.0)
LYMPHS PCT: 21 %
MCH: 33 pg (ref 26.0–34.0)
MCHC: 34.4 g/dL (ref 30.0–36.0)
MCV: 96.2 fL (ref 78.0–100.0)
Monocytes Absolute: 0.7 10*3/uL (ref 0.1–1.0)
Monocytes Relative: 12 %
NEUTROS ABS: 3.6 10*3/uL (ref 1.7–7.7)
NEUTROS PCT: 64 %
Platelets: 163 10*3/uL (ref 150–400)
RBC: 3.39 MIL/uL — AB (ref 3.87–5.11)
RDW: 14.8 % (ref 11.5–15.5)
WBC: 5.6 10*3/uL (ref 4.0–10.5)

## 2017-06-19 LAB — ETHANOL: ALCOHOL ETHYL (B): 7 mg/dL — AB (ref <5)

## 2017-06-19 LAB — COMPREHENSIVE METABOLIC PANEL
ALK PHOS: 108 U/L (ref 38–126)
ALT: 15 U/L (ref 14–54)
AST: 30 U/L (ref 15–41)
Albumin: 3.9 g/dL (ref 3.5–5.0)
Anion gap: 11 (ref 5–15)
BUN: 11 mg/dL (ref 6–20)
CALCIUM: 9.3 mg/dL (ref 8.9–10.3)
CO2: 24 mmol/L (ref 22–32)
Chloride: 99 mmol/L — ABNORMAL LOW (ref 101–111)
Creatinine, Ser: 0.92 mg/dL (ref 0.44–1.00)
Glucose, Bld: 79 mg/dL (ref 65–99)
Potassium: 3.7 mmol/L (ref 3.5–5.1)
Sodium: 134 mmol/L — ABNORMAL LOW (ref 135–145)
Total Bilirubin: 0.2 mg/dL — ABNORMAL LOW (ref 0.3–1.2)
Total Protein: 6.3 g/dL — ABNORMAL LOW (ref 6.5–8.1)

## 2017-06-19 LAB — CULTURE, BLOOD (ROUTINE X 2)
Culture: NO GROWTH
Culture: NO GROWTH
SPECIAL REQUESTS: ADEQUATE
SPECIAL REQUESTS: ADEQUATE

## 2017-06-19 LAB — LACTIC ACID, PLASMA: LACTIC ACID, VENOUS: 1.7 mmol/L (ref 0.5–1.9)

## 2017-06-19 MED ORDER — SODIUM CHLORIDE 0.9 % IV BOLUS (SEPSIS)
1000.0000 mL | Freq: Once | INTRAVENOUS | Status: AC
  Administered 2017-06-19: 1000 mL via INTRAVENOUS

## 2017-06-19 MED ORDER — LORAZEPAM 1 MG PO TABS: 0.0000 mg | ORAL_TABLET | Freq: Two times a day (BID) | ORAL | Status: DC

## 2017-06-19 MED ORDER — LORAZEPAM 2 MG/ML IJ SOLN
0.0000 mg | Freq: Two times a day (BID) | INTRAMUSCULAR | Status: DC
Start: 2017-06-21 — End: 2017-06-19

## 2017-06-19 MED ORDER — LORAZEPAM 2 MG/ML IJ SOLN
0.0000 mg | Freq: Four times a day (QID) | INTRAMUSCULAR | Status: DC
Start: 2017-06-19 — End: 2017-06-19

## 2017-06-19 MED ORDER — VITAMIN B-1 100 MG PO TABS: 100.0000 mg | ORAL_TABLET | Freq: Every day | ORAL | Status: DC

## 2017-06-19 MED ORDER — THIAMINE HCL 100 MG/ML IJ SOLN: 100.0000 mg | Freq: Every day | INTRAMUSCULAR | Status: DC

## 2017-06-19 MED ORDER — LORAZEPAM 1 MG PO TABS: 0.0000 mg | ORAL_TABLET | Freq: Four times a day (QID) | ORAL | Status: DC

## 2017-06-19 NOTE — ED Triage Notes (Signed)
Pt brought in by EMS with c/o anxiety  Pt was seen her this morning around 1am for same  Pt states she was unable to get her medication filled today and her anxiety is worse

## 2017-06-19 NOTE — Progress Notes (Signed)
Pt's Correct address given to [facility]. [address] [place], [place]

## 2017-06-19 NOTE — ED Provider Notes (Signed)
Care assumed from Charlann Lange, PA-C at shift change pending social work consult and UA. She reported to the ED for generalized pain. Per previous provider's workup, EtOH is 7, negative CT head, UDS positive for benzodiazepine, UA negative.  Social work to consult to determine safe home environment and possible placement.   On exam, pt calm and cooperative, acting and responding appropriately. Eating a meal. Reports symptoms have improved. Agreeable for discharge.  SW recommending patient is safe for discharge home. At discharge yesterday, pt given resources for substance abuse counseling, PCP appointment set up, Rx given. Pt encouraged to attend PCP appointment and take Rx as prescribed. Pt is safe for discharge.  Patient discussed with Dr. Zenia Resides.   Discussed results, findings, treatment and follow up. Patient advised of return precautions. Patient verbalized understanding and agreed with plan.    Russo, [place] N, PA-C 06/19/17 1054    Lacretia Leigh, MD 06/20/17 [phone]
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
## 2017-06-19 NOTE — ED Notes (Signed)
Attempted to insert IV catheter x 2.  Unsuccessful.  Charge nurse notified.
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
## 2017-06-19 NOTE — ED Triage Notes (Signed)
Pt brought in by [facility] d/t ETOH & pt complaint of pain.
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
## 2017-06-19 NOTE — ED Notes (Signed)
Bed: RJ08 Expected date:  Expected time:  Means of arrival:  Comments: EMS
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
## 2017-06-19 NOTE — Progress Notes (Signed)
CSW aware of consult and will meet with patient asap.   Kingsley Spittle, [facility] Emergency Room Clinical Social Worker [phone]

## 2017-06-19 NOTE — ED Notes (Signed)
Patient transported to CT 
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
## 2017-06-19 NOTE — Discharge Instructions (Signed)
Please read instructions below. Attend the appointment that was scheduled for you yesterday to establish primary care.  Fill your new prescriptions and began taking them as prescribed. Return to the ER for new or concerning symptoms.

## 2017-06-20 ENCOUNTER — Ambulatory Visit: Payer: Self-pay | Admitting: Family Medicine

## 2017-06-20 LAB — URINE CULTURE: Culture: NO GROWTH
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
## 2017-06-20 NOTE — Discharge Instructions (Signed)
Please follow up for primary care visit tomorrow at [facility]

## 2017-06-21 ENCOUNTER — Emergency Department (HOSPITAL_COMMUNITY)
Admission: EM | Admit: 2017-06-21 | Discharge: 2017-06-21 | Discharge status: Against Medical Advice | Payer: BLUE CROSS/BLUE SHIELD | Attending: Emergency Medicine | Admitting: Emergency Medicine

## 2017-06-21 ENCOUNTER — Emergency Department (HOSPITAL_COMMUNITY): Payer: BLUE CROSS/BLUE SHIELD

## 2017-06-21 DIAGNOSIS — R0789 Other chest pain: Secondary | ICD-10-CM | POA: Insufficient documentation

## 2017-06-21 DIAGNOSIS — Z5321 Procedure and treatment not carried out due to patient leaving prior to being seen by health care provider: Secondary | ICD-10-CM | POA: Diagnosis not present

## 2017-06-21 NOTE — ED Triage Notes (Signed)
Per EMS pt fell last PM and is now c/o chest wall pain

## 2017-06-23 ENCOUNTER — Encounter (HOSPITAL_COMMUNITY): Payer: Self-pay | Admitting: *Deleted

## 2017-06-23 ENCOUNTER — Emergency Department (HOSPITAL_COMMUNITY)
Admission: EM | Admit: 2017-06-23 | Discharge: 2017-06-23 | Disposition: A | Discharge status: Home / Self Care | Payer: BLUE CROSS/BLUE SHIELD | Attending: Emergency Medicine | Admitting: Emergency Medicine

## 2017-06-23 DIAGNOSIS — E039 Hypothyroidism, unspecified: Secondary | ICD-10-CM | POA: Diagnosis not present

## 2017-06-23 DIAGNOSIS — F17210 Nicotine dependence, cigarettes, uncomplicated: Secondary | ICD-10-CM | POA: Insufficient documentation

## 2017-06-23 DIAGNOSIS — F419 Anxiety disorder, unspecified: Secondary | ICD-10-CM | POA: Diagnosis not present

## 2017-06-23 DIAGNOSIS — F10929 Alcohol use, unspecified with intoxication, unspecified: Secondary | ICD-10-CM | POA: Diagnosis present

## 2017-06-23 HISTORY — DX: Alcohol abuse, uncomplicated: F10.10

## 2017-06-23 NOTE — ED Notes (Signed)
Patient requesting to leave and standing at the bedside.

## 2017-06-23 NOTE — ED Notes (Signed)
Bed: [facility] Expected date:  Expected time:  Means of arrival:  Comments: ETOH- pain all over
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
## 2017-06-23 NOTE — Discharge Instructions (Signed)
Recommend community mental health resources
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
## 2017-06-27 ENCOUNTER — Encounter (HOSPITAL_COMMUNITY): Payer: Self-pay | Admitting: Emergency Medicine

## 2017-06-27 ENCOUNTER — Emergency Department (HOSPITAL_COMMUNITY)
Admission: EM | Admit: 2017-06-27 | Discharge: 2017-06-27 | Disposition: A | Discharge status: Home / Self Care | Payer: BLUE CROSS/BLUE SHIELD | Attending: Emergency Medicine | Admitting: Emergency Medicine

## 2017-06-27 DIAGNOSIS — F17210 Nicotine dependence, cigarettes, uncomplicated: Secondary | ICD-10-CM | POA: Diagnosis not present

## 2017-06-27 DIAGNOSIS — Z79899 Other long term (current) drug therapy: Secondary | ICD-10-CM | POA: Diagnosis not present

## 2017-06-27 DIAGNOSIS — F1910 Other psychoactive substance abuse, uncomplicated: Secondary | ICD-10-CM | POA: Insufficient documentation

## 2017-06-27 MED ORDER — ONDANSETRON 4 MG PO TBDP: 4.0000 mg | ORAL_TABLET | Freq: Three times a day (TID) | ORAL | 0 refills | Status: DC | PRN

## 2017-06-27 MED ORDER — CHLORDIAZEPOXIDE HCL 25 MG PO CAPS: ORAL_CAPSULE | ORAL | 0 refills | Status: DC

## 2017-06-27 NOTE — ED Provider Notes (Signed)
[facility] DEPT Provider Note   CSN: [id number] Arrival date & time: 06/27/17  2376     History   Chief Complaint Chief Complaint  Patient presents with  . Detox    HPI Susan Mccarthy is a 66 y.o. female.  The history is provided by the patient and medical records.    66 year old female with history of anxiety, arthritis, depression, alcohol abuse, insomnia, thyroid disease, presenting to the ED desiring detox. Patient gives a long drawn out history about how her life has "gotten out of control" and she is dealing with her stressors with substances. States she has been abusing alcohol, nicotine, benzodiazepines, and a medication for quite some time now. She reports she understands these substances, especially when combined, can be detrimental to her health and she is interested in stopping them. States she drinks on a regular basis, but states she is always "functional". She denies any blackouts, syncopal events, or seizures. She denies any chest pain or shortness of breath. Patient states he previously had an ACT team, however has lost contact with them. She is interested in her options for detox.  Reports she was previously in 12 step program for alcohol which she felt helped her a great deal.  She states she is always somewhat anxious but she adamantly denies any suicidal or homicidal ideation. No hallucinations. Patient's only physical complaint at this time is that she is hungry.  Past Medical History:  Diagnosis Date  . Anxiety   . Arthritis   . Depression   . ETOH abuse   . History of chemotherapy    INTERFERON  . Insomnia   . Melanoma ([place]) 1980   RIGHT BACK  . Thyroid ca ([place])   . Thyroid disease    85 % BENIGN    Patient Active Problem List   Diagnosis Date Noted  . Hypotension 06/14/2017  . Hypokalemia 06/14/2017  . AKI (acute kidney injury) ([place]) 06/14/2017  . Normocytic anemia 06/14/2017  . Hyponatremia 06/14/2017  . Alcoholic intoxication without  complication ([place])   . Anxiety   . Hypothyroid 05/04/2017  . Paranoia ([place])   . Generalized anxiety disorder 08/06/2016  . Panic disorder 08/06/2016  . Alcohol use disorder, severe, dependence ([place]) 06/25/2016  . Alcohol withdrawal ([place]) 06/25/2016  . Opioid use disorder, moderate, dependence ([place]) 06/25/2016  . Bipolar disorder, current episode mixed ([place]) 06/25/2016  . Bulimia nervosa 06/25/2016  . Hypothyroidism, postop 07/08/2014  . GERD (gastroesophageal reflux disease) 01/12/2013  . Cancer of thyroid ([place]) 12/19/2012  . Malignant melanoma, metastatic ([place]) 12/10/2012  . Arthritis, degenerative 12/10/2012    Past Surgical History:  Procedure Laterality Date  . AXILLARY NODE DISSECTION  08/02/11   right axillary,  . Excision of melanoma  1980  . PERCUTANEOUS PINNING Left 08/27/2013   Procedure: PERCUTANEOUS PINNING EXTREMITY;  Surgeon: Rozanna Box, MD;  Location: [facility];  Service: Orthopedics;  Laterality: Left;  . RASH     ON FACE AND HANDS S/P CHEMOTHERAPY  . SACRO-ILIAC PINNING Bilateral 08/27/2013   Procedure: Dub Mikes;  Surgeon: Rozanna Box, MD;  Location: [facility];  Service: Orthopedics;  Laterality: Bilateral;  Handy Bed, OIC Cannulated Screws  . THYROIDECTOMY  1/14   cancer    OB History    Gravida Para Term Preterm AB Living   0             SAB TAB Ectopic Multiple Live Births  Obstetric Comments   MENARCHE TENNAGER 10, NO HRT, BIRTH CONTROL PILLS 2 YEARS       Home Medications    Prior to Admission medications   Medication Sig Start Date End Date Taking? Authorizing Provider  chlordiazePOXIDE (LIBRIUM) 25 MG capsule Take 1 cap 3 time a day for 1 day, then take 1 cap 2 time a day for 1 day, the take 1 cap for 1 day 06/18/17   Patrecia Pour, Christean Grief, MD  cholecalciferol (VITAMIN D) 1000 units tablet Take 1,000 Units by mouth daily.    [provider]  fexofenadine-pseudoephedrine (ALLEGRA-D 24) 180-240 MG 24 hr tablet  Take 1 tablet by mouth daily.    [provider]  folic acid (FOLVITE) 1 MG tablet Take 1 tablet (1 mg total) by mouth daily. 06/19/17   Doreatha Lew, MD  levothyroxine (SYNTHROID, LEVOTHROID) 100 MCG tablet Take 1 tablet (100 mcg total) by mouth daily before breakfast. 06/18/17   Patrecia Pour, Christean Grief, MD  Multiple Vitamin (MULTIVITAMIN WITH MINERALS) TABS tablet Take 1 tablet by mouth daily. 06/19/17   Doreatha Lew, MD  thiamine (B-1) 100 MG/ML injection Inject 1 mL (100 mg total) into the vein daily. 06/19/17   Doreatha Lew, MD    Family History Family History  Problem Relation Age of Onset  . ALS Father     Social History Social History  Substance Use Topics  . Smoking status: Current Some Day Smoker    Packs/day: 0.25    Years: 5.00    Types: Cigarettes    Start date: 01/11/1974    Last attempt to quit: 06/07/1979  . Smokeless tobacco: Never Used     Comment: quit 35 years  . Alcohol use 0.0 oz/week     Allergies   Other   Review of Systems Review of Systems  Psychiatric/Behavioral:       Wants detox  All other systems reviewed and are negative.    Physical Exam Updated Vital Signs BP (!) 152/106 (BP Location: Right Arm)   Pulse 72   Temp 97.9 F (36.6 C)   Resp 16   LMP 10/24/2011   SpO2 97%   Physical Exam  Constitutional: She is oriented to person, place, and time. She appears well-developed and well-nourished.  HENT:  Head: Normocephalic and atraumatic.  Mouth/Throat: Oropharynx is clear and moist.  Eyes: Pupils are equal, round, and reactive to light. Conjunctivae and EOM are normal.  Neck: Normal range of motion.  Cardiovascular: Normal rate, regular rhythm and normal heart sounds.   Pulmonary/Chest: Effort normal and breath sounds normal. No respiratory distress. She has no wheezes.  Abdominal: Soft. Bowel sounds are normal. There is no tenderness. There is no rebound.  Musculoskeletal: Normal range of motion.  Neurological:  She is alert and oriented to person, place, and time.  Skin: Skin is warm and dry.  Psychiatric: She has a normal mood and affect. She is not actively hallucinating. She expresses no homicidal and no suicidal ideation. She expresses no suicidal plans and no homicidal plans.  Nursing note and vitals reviewed.    ED Treatments / Results  Labs (all labs ordered are listed, but only abnormal results are displayed) Labs Reviewed - No data to display  EKG  EKG Interpretation None       Radiology No results found.  Procedures Procedures (including critical care time)  Medications Ordered in ED Medications - No data to display   Initial Impression / Assessment and Plan /  ED Course  I have reviewed the triage vital signs and the nursing notes.  Pertinent labs & imaging results that were available during my care of the patient were reviewed by me and considered in my medical decision making (see chart for details).  66 year old female here requesting detox. Reports she has been abusing alcohol, benzodiazepines, nicotine, and pain medication in order to deal with her life stressors. No apparent overdoses. She is able to give an elaborate history here and appears clinically sober.  She is not displaying any tremors or seizure activity, no acute signs of withdrawal. Patient denies any suicidal or homicidal ideation. She does not appear to be a danger to herself or others at this time. Feel she is appropriate for outpatient follow-up. Given that she desires alcohol detox, will start on Librium taper, given prescription for Zofran. She was given resource guide to help follow-up.  Patient discharged home in stable condition.  Final Clinical Impressions(s) / ED Diagnoses   Final diagnoses:  Polysubstance abuse    New Prescriptions Discharge Medication List as of 06/27/2017  9:27 AM    START taking these medications   Details  ondansetron (ZOFRAN ODT) 4 MG disintegrating tablet Take 1  tablet (4 mg total) by mouth every 8 (eight) hours as needed for nausea., Starting Thu 06/27/2017, Print         Larene Pickett, PA-C 06/27/17 1024    Long, [facility], MD 06/27/17 1039

## 2017-06-27 NOTE — Discharge Instructions (Signed)
Recommend that you use the resource guide provided to help find an appropriate detox facility for you. The Librium taper will help you detox from alcohol. Continues to Zofran for nausea as needed. I recommend that you follow-up with one of the detox facilities as soon as she can. You can return here for any new or worsening symptoms.

## 2017-06-27 NOTE — ED Triage Notes (Signed)
Per EMS pt requesting detox from xanax, opiates, and ETOH.

## 2017-06-27 NOTE — ED Notes (Signed)
Bed: [facility] Expected date:  Expected time:  Means of arrival:  Comments: Hold

## 2017-06-28 ENCOUNTER — Emergency Department (HOSPITAL_COMMUNITY)
Admission: EM | Admit: 2017-06-28 | Discharge: 2017-06-28 | Discharge status: Against Medical Advice | Payer: BLUE CROSS/BLUE SHIELD
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
## 2017-06-29 ENCOUNTER — Emergency Department (HOSPITAL_COMMUNITY)
Admission: EM | Admit: 2017-06-29 | Discharge: 2017-06-30 | Disposition: A | Discharge status: Other Acute Inpatient Hospital | Payer: BLUE CROSS/BLUE SHIELD | Attending: Emergency Medicine | Admitting: Emergency Medicine

## 2017-06-29 ENCOUNTER — Emergency Department (HOSPITAL_COMMUNITY): Payer: BLUE CROSS/BLUE SHIELD

## 2017-06-29 ENCOUNTER — Other Ambulatory Visit: Payer: Self-pay

## 2017-06-29 DIAGNOSIS — Z85820 Personal history of malignant melanoma of skin: Secondary | ICD-10-CM | POA: Insufficient documentation

## 2017-06-29 DIAGNOSIS — F17210 Nicotine dependence, cigarettes, uncomplicated: Secondary | ICD-10-CM | POA: Insufficient documentation

## 2017-06-29 DIAGNOSIS — Z79899 Other long term (current) drug therapy: Secondary | ICD-10-CM | POA: Diagnosis not present

## 2017-06-29 DIAGNOSIS — Z049 Encounter for examination and observation for unspecified reason: Secondary | ICD-10-CM

## 2017-06-29 DIAGNOSIS — R451 Restlessness and agitation: Secondary | ICD-10-CM | POA: Diagnosis present

## 2017-06-29 DIAGNOSIS — F1020 Alcohol dependence, uncomplicated: Secondary | ICD-10-CM

## 2017-06-29 DIAGNOSIS — F10229 Alcohol dependence with intoxication, unspecified: Secondary | ICD-10-CM | POA: Diagnosis not present

## 2017-06-29 DIAGNOSIS — Z85850 Personal history of malignant neoplasm of thyroid: Secondary | ICD-10-CM | POA: Diagnosis not present

## 2017-06-29 DIAGNOSIS — F332 Major depressive disorder, recurrent severe without psychotic features: Secondary | ICD-10-CM

## 2017-06-29 DIAGNOSIS — E039 Hypothyroidism, unspecified: Secondary | ICD-10-CM | POA: Insufficient documentation

## 2017-06-29 DIAGNOSIS — F3160 Bipolar disorder, current episode mixed, unspecified: Secondary | ICD-10-CM | POA: Diagnosis present

## 2017-06-29 LAB — COMPREHENSIVE METABOLIC PANEL
ALT: 12 U/L — ABNORMAL LOW (ref 14–54)
ANION GAP: 9 (ref 5–15)
AST: 33 U/L (ref 15–41)
Albumin: 4.1 g/dL (ref 3.5–5.0)
Alkaline Phosphatase: 112 U/L (ref 38–126)
BUN: 11 mg/dL (ref 6–20)
CHLORIDE: 99 mmol/L — AB (ref 101–111)
CO2: 26 mmol/L (ref 22–32)
Calcium: 8.5 mg/dL — ABNORMAL LOW (ref 8.9–10.3)
Creatinine, Ser: 0.92 mg/dL (ref 0.44–1.00)
Glucose, Bld: 73 mg/dL (ref 65–99)
POTASSIUM: 3.3 mmol/L — AB (ref 3.5–5.1)
Sodium: 134 mmol/L — ABNORMAL LOW (ref 135–145)
Total Bilirubin: 0.3 mg/dL (ref 0.3–1.2)
Total Protein: 7 g/dL (ref 6.5–8.1)

## 2017-06-29 LAB — CBC
HCT: 32 % — ABNORMAL LOW (ref 36.0–46.0)
Hemoglobin: 11 g/dL — ABNORMAL LOW (ref 12.0–15.0)
MCH: 33.2 pg (ref 26.0–34.0)
MCHC: 34.4 g/dL (ref 30.0–36.0)
MCV: 96.7 fL (ref 78.0–100.0)
PLATELETS: 277 10*3/uL (ref 150–400)
RBC: 3.31 MIL/uL — AB (ref 3.87–5.11)
RDW: 15.1 % (ref 11.5–15.5)
WBC: 5.3 10*3/uL (ref 4.0–10.5)

## 2017-06-29 LAB — ETHANOL: ALCOHOL ETHYL (B): 194 mg/dL — AB (ref <5)

## 2017-06-29 LAB — ACETAMINOPHEN LEVEL: Acetaminophen (Tylenol), Serum: <10 ug/mL — ABNORMAL LOW (ref 10–30)

## 2017-06-29 LAB — SALICYLATE LEVEL

## 2017-06-29 MED ORDER — LEVOTHYROXINE SODIUM 100 MCG PO TABS
100.0000 ug | ORAL_TABLET | Freq: Every day | ORAL | Status: DC
  Administered 2017-06-29 – 2017-06-30 (×2): 100 ug via ORAL
  Filled 2017-06-29 (×2): qty 1

## 2017-06-29 MED ORDER — ALUM & MAG HYDROXIDE-SIMETH 200-200-20 MG/5ML PO SUSP: 30.0000 mL | Freq: Four times a day (QID) | ORAL | Status: DC | PRN

## 2017-06-29 MED ORDER — ALBUTEROL SULFATE (2.5 MG/3ML) 0.083% IN NEBU
2.5000 mg | INHALATION_SOLUTION | Freq: Once | RESPIRATORY_TRACT | Status: AC
  Administered 2017-06-29: 2.5 mg via RESPIRATORY_TRACT
  Filled 2017-06-29: qty 3

## 2017-06-29 MED ORDER — IBUPROFEN 200 MG PO TABS
600.0000 mg | ORAL_TABLET | Freq: Three times a day (TID) | ORAL | Status: DC | PRN
  Administered 2017-06-30: 600 mg via ORAL
  Filled 2017-06-29: qty 3

## 2017-06-29 MED ORDER — VITAMIN B-1 100 MG PO TABS
100.0000 mg | ORAL_TABLET | Freq: Every day | ORAL | Status: DC
  Administered 2017-06-29 – 2017-06-30 (×2): 100 mg via ORAL
  Filled 2017-06-29 (×2): qty 1

## 2017-06-29 MED ORDER — LORAZEPAM 2 MG/ML IJ SOLN: 0.0000 mg | Freq: Two times a day (BID) | INTRAMUSCULAR | Status: DC

## 2017-06-29 MED ORDER — THIAMINE HCL 100 MG/ML IJ SOLN: 100.0000 mg | Freq: Every day | INTRAMUSCULAR | Status: DC

## 2017-06-29 MED ORDER — GABAPENTIN 100 MG PO CAPS
200.0000 mg | ORAL_CAPSULE | Freq: Two times a day (BID) | ORAL | Status: DC
  Administered 2017-06-29 – 2017-06-30 (×3): 200 mg via ORAL
  Filled 2017-06-29 (×3): qty 2

## 2017-06-29 MED ORDER — LORAZEPAM 2 MG/ML IJ SOLN
0.0000 mg | Freq: Four times a day (QID) | INTRAMUSCULAR | Status: DC
  Administered 2017-06-29: 2 mg via INTRAVENOUS
  Filled 2017-06-29: qty 1

## 2017-06-29 MED ORDER — ONDANSETRON HCL 4 MG PO TABS: 4.0000 mg | ORAL_TABLET | Freq: Three times a day (TID) | ORAL | Status: DC | PRN

## 2017-06-29 MED ORDER — LORAZEPAM 1 MG PO TABS: 0.0000 mg | ORAL_TABLET | Freq: Two times a day (BID) | ORAL | Status: DC

## 2017-06-29 MED ORDER — LORAZEPAM 1 MG PO TABS
0.0000 mg | ORAL_TABLET | Freq: Four times a day (QID) | ORAL | Status: DC
  Administered 2017-06-29: 1 mg via ORAL
  Administered 2017-06-29: 2 mg via ORAL
  Administered 2017-06-29 – 2017-06-30 (×2): 1 mg via ORAL
  Filled 2017-06-29 (×3): qty 1
  Filled 2017-06-29: qty 2

## 2017-06-29 NOTE — ED Notes (Signed)
Patient with complaints of anxiety and withdrawal symptoms. Patient pleasant and cooperative at this time. Pt verbally denies plans to harm herself currently. No distress noted.
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
## 2017-06-29 NOTE — ED Notes (Signed)
Spoke with Susan Mccarthy from admissions at [facility].  Patient is being accepted by Dr. Eugenio Hoes  Report can be called on 06/30/17 after 10:00 am to [phone].

## 2017-06-29 NOTE — ED Notes (Signed)
Patient requesting antibiotic for bacterial infection. States she has had a clear foul smelling vaginal discharge for more than 2 weeks.  Patient noted to have bilateral wheezing patient denies being short of breath. Patient  States, when I am  anxious she wheeze.

## 2017-06-29 NOTE — Progress Notes (Signed)
CSW filed patient's examination and recommendation paperwork into IVC logbook.  Jolita Haefner, LCSWA [place] [facility]  Clinical Social Worker [phone] 
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
## 2017-06-29 NOTE — ED Provider Notes (Signed)
[place] DEPT Provider Note   CSN: [id number] Arrival date & time: 06/29/17  0144     History   Chief Complaint Chief Complaint  Patient presents with  . Medical Clearance    IVC  . Alcohol Problem   Level V caveat: Agitation  HPI Susan Mccarthy is a 66 y.o. female.  HPI Patient has a history of alcohol abuse and polysubstance abuse.  She is brought to the emergency department under IVC paperwork taken out by her stepson for increasing agitation and ongoing substance and alcohol abuse.  Patient is agitated this time.  No fevers or chills.  Patient denies nausea vomiting and abdominal pain.   Past Medical History:  Diagnosis Date  . Anxiety   . Arthritis   . Depression   . ETOH abuse   . History of chemotherapy    INTERFERON  . Insomnia   . Melanoma ([place]) 1980   RIGHT BACK  . Thyroid ca ([place])   . Thyroid disease    85 % BENIGN    Patient Active Problem List   Diagnosis Date Noted  . Hypotension 06/14/2017  . Hypokalemia 06/14/2017  . AKI (acute kidney injury) ([place]) 06/14/2017  . Normocytic anemia 06/14/2017  . Hyponatremia 06/14/2017  . Alcoholic intoxication without complication ([place])   . Anxiety   . Hypothyroid 05/04/2017  . Paranoia ([place])   . Generalized anxiety disorder 08/06/2016  . Panic disorder 08/06/2016  . Alcohol use disorder, severe, dependence ([place]) 06/25/2016  . Alcohol withdrawal ([place]) 06/25/2016  . Opioid use disorder, moderate, dependence ([place]) 06/25/2016  . Bipolar disorder, current episode mixed ([place]) 06/25/2016  . Bulimia nervosa 06/25/2016  . Hypothyroidism, postop 07/08/2014  . GERD (gastroesophageal reflux disease) 01/12/2013  . Cancer of thyroid ([place]) 12/19/2012  . Malignant melanoma, metastatic ([place]) 12/10/2012  . Arthritis, degenerative 12/10/2012    Past Surgical History:  Procedure Laterality Date  . AXILLARY NODE DISSECTION  08/02/11   right axillary,  . Excision of melanoma  1980  . PERCUTANEOUS PINNING Left  08/27/2013   Procedure: PERCUTANEOUS PINNING EXTREMITY;  Surgeon: Rozanna Box, MD;  Location: [place];  Service: Orthopedics;  Laterality: Left;  . RASH     ON FACE AND HANDS S/P CHEMOTHERAPY  . SACRO-ILIAC PINNING Bilateral 08/27/2013   Procedure: Dub Mikes;  Surgeon: Rozanna Box, MD;  Location: [place];  Service: Orthopedics;  Laterality: Bilateral;  Handy Bed, OIC Cannulated Screws  . THYROIDECTOMY  1/14   cancer    OB History    Gravida Para Term Preterm AB Living   0             SAB TAB Ectopic Multiple Live Births                  Obstetric Comments   MENARCHE TENNAGER 10, NO HRT, BIRTH CONTROL PILLS 1 YEARS       Home Medications    Prior to Admission medications   Medication Sig Start Date End Date Taking? Authorizing Provider  cholecalciferol (VITAMIN D) 1000 units tablet Take 1,000 Units by mouth daily.   Yes [provider]  fexofenadine-pseudoephedrine (ALLEGRA-D 24) 180-240 MG 24 hr tablet Take 1 tablet by mouth daily.   Yes [provider]  levothyroxine (SYNTHROID, LEVOTHROID) 100 MCG tablet Take 1 tablet (100 mcg total) by mouth daily before breakfast. 06/18/17  Yes Patrecia Pour, Christean Grief, MD  chlordiazePOXIDE (LIBRIUM) 25 MG capsule 50mg  PO TID x 1D, then 25-50mg  PO BID X 1D,  then 25-50mg  PO QD X 1D Patient not taking: Reported on 06/29/2017 06/27/17   Larene Pickett, PA-C  folic acid (FOLVITE) 1 MG tablet Take 1 tablet (1 mg total) by mouth daily. 06/19/17   Doreatha Lew, MD  Multiple Vitamin (MULTIVITAMIN WITH MINERALS) TABS tablet Take 1 tablet by mouth daily. 06/19/17   Doreatha Lew, MD  ondansetron (ZOFRAN ODT) 4 MG disintegrating tablet Take 1 tablet (4 mg total) by mouth every 8 (eight) hours as needed for nausea. Patient not taking: Reported on 06/29/2017 06/27/17   Larene Pickett, PA-C  thiamine (B-1) 100 MG/ML injection Inject 1 mL (100 mg total) into the vein daily. 06/19/17   Doreatha Lew, MD    Family  History Family History  Problem Relation Age of Onset  . ALS Father     Social History Social History  Substance Use Topics  . Smoking status: Current Some Day Smoker    Packs/day: 0.25    Years: 5.00    Types: Cigarettes    Start date: 01/11/1974    Last attempt to quit: 06/07/1979  . Smokeless tobacco: Never Used     Comment: quit 35 years  . Alcohol use 0.0 oz/week     Allergies   Other   Review of Systems Review of Systems  Unable to perform ROS: Psychiatric disorder     Physical Exam Updated Vital Signs BP 98/68 (BP Location: Left Arm)   Pulse 68   Temp 98 F (36.7 C) (Oral)   Resp 16   LMP 10/24/2011   SpO2 91%   Physical Exam  Constitutional: She is oriented to person, place, and time. She appears well-developed and well-nourished. No distress.  HENT:  Head: Normocephalic and atraumatic.  Eyes: EOM are normal.  Neck: Normal range of motion.  Cardiovascular: Normal rate, regular rhythm and normal heart sounds.   Pulmonary/Chest: Effort normal and breath sounds normal.  Abdominal: Soft. She exhibits no distension. There is no tenderness.  Musculoskeletal: Normal range of motion.  Neurological: She is alert and oriented to person, place, and time.  Skin: Skin is warm and dry.  Psychiatric: She has a normal mood and affect. Judgment normal.  Nursing note and vitals reviewed.    ED Treatments / Results  Labs (all labs ordered are listed, but only abnormal results are displayed) Labs Reviewed  COMPREHENSIVE METABOLIC PANEL - Abnormal; Notable for the following:       Result Value   Sodium 134 (*)    Potassium 3.3 (*)    Chloride 99 (*)    Calcium 8.5 (*)    ALT 12 (*)    All other components within normal limits  ETHANOL - Abnormal; Notable for the following:    Alcohol, Ethyl (B) 194 (*)    All other components within normal limits  ACETAMINOPHEN LEVEL - Abnormal; Notable for the following:    Acetaminophen (Tylenol), Serum <10 (*)    All other  components within normal limits  CBC - Abnormal; Notable for the following:    RBC 3.31 (*)    Hemoglobin 11.0 (*)    HCT 32.0 (*)    All other components within normal limits  SALICYLATE LEVEL  RAPID URINE DRUG SCREEN, HOSP PERFORMED    EKG  EKG Interpretation None       Radiology No results found.  Procedures Procedures (including critical care time)  Medications Ordered in ED Medications  levothyroxine (SYNTHROID, LEVOTHROID) tablet 100 mcg (not administered)  LORazepam (  ATIVAN) injection 0-4 mg (2 mg Intravenous Given 06/29/17 0241)    Or  LORazepam (ATIVAN) tablet 0-4 mg ( Oral See Alternative 06/29/17 0241)  LORazepam (ATIVAN) injection 0-4 mg (not administered)    Or  LORazepam (ATIVAN) tablet 0-4 mg (not administered)  thiamine (VITAMIN B-1) tablet 100 mg (not administered)    Or  thiamine (B-1) injection 100 mg (not administered)  ibuprofen (ADVIL,MOTRIN) tablet 600 mg (not administered)  ondansetron (ZOFRAN) tablet 4 mg (not administered)  alum & mag hydroxide-simeth (MAALOX/MYLANTA) 200-200-20 MG/5ML suspension 30 mL (not administered)     Initial Impression / Assessment and Plan / ED Course  I have reviewed the triage vital signs and the nursing notes.  Pertinent labs & imaging results that were available during my care of the patient were reviewed by me and considered in my medical decision making (see chart for details).     Medically clear.  TTS to evaluate.  Intoxicated.  Alcohol level 194.  Final Clinical Impressions(s) / ED Diagnoses   Final diagnoses:  None    New Prescriptions New Prescriptions   No medications on file     Jola Schmidt, MD 06/29/17 [phone]

## 2017-06-29 NOTE — Progress Notes (Signed)
Per Psychiatrist Akintayo and DNP Lord, patient meets inpatient criteria. CSW faxed patient's referral to: Cristal Ford, [facility], [place], [place], [place], Teacher, music, [place].   Abundio Miu, [place] [facility]  Clinical Social Worker [phone]

## 2017-06-30 DIAGNOSIS — F332 Major depressive disorder, recurrent severe without psychotic features: Secondary | ICD-10-CM | POA: Diagnosis present

## 2017-06-30 LAB — RAPID URINE DRUG SCREEN, HOSP PERFORMED
Amphetamines: NOT DETECTED
BARBITURATES: NOT DETECTED
BENZODIAZEPINES: POSITIVE — AB
Cocaine: NOT DETECTED
Opiates: NOT DETECTED
Tetrahydrocannabinol: NOT DETECTED

## 2017-06-30 LAB — URINALYSIS, ROUTINE W REFLEX MICROSCOPIC
Bilirubin Urine: NEGATIVE
Glucose, UA: NEGATIVE mg/dL
Hgb urine dipstick: NEGATIVE
KETONES UR: NEGATIVE mg/dL
Nitrite: POSITIVE — AB
PH: 6 (ref 5.0–8.0)
Protein, ur: NEGATIVE mg/dL
SPECIFIC GRAVITY, URINE: 1.018 (ref 1.005–1.030)

## 2017-06-30 MED ORDER — METRONIDAZOLE 500 MG PO TABS
2000.0000 mg | ORAL_TABLET | Freq: Once | ORAL | Status: AC
  Administered 2017-06-30: 2000 mg via ORAL
  Filled 2017-06-30: qty 4

## 2017-06-30 MED ORDER — ALBUTEROL SULFATE HFA 108 (90 BASE) MCG/ACT IN AERS
2.0000 | INHALATION_SPRAY | Freq: Four times a day (QID) | RESPIRATORY_TRACT | Status: DC | PRN
  Administered 2017-06-30: 2 via RESPIRATORY_TRACT
  Filled 2017-06-30: qty 6.7

## 2017-06-30 NOTE — ED Notes (Signed)
Patient agitated ambulating in room states she does not want to go to [facility].

## 2017-06-30 NOTE — ED Notes (Signed)
Patient transported to [facility] via [place].  Belongings given to officer Henrene Pastor.

## 2017-06-30 NOTE — ED Notes (Signed)
Report called to Jerrilyn Cairo, RN  at [facility].  [place]  Office to arrange transport to [facility].

## 2017-07-09 ENCOUNTER — Emergency Department (HOSPITAL_COMMUNITY)
Admission: EM | Admit: 2017-07-09 | Discharge: 2017-07-11 | Disposition: A | Discharge status: Other Acute Inpatient Hospital | Payer: BLUE CROSS/BLUE SHIELD | Attending: Emergency Medicine | Admitting: Emergency Medicine

## 2017-07-09 ENCOUNTER — Encounter (HOSPITAL_COMMUNITY): Payer: Self-pay

## 2017-07-09 DIAGNOSIS — Z79899 Other long term (current) drug therapy: Secondary | ICD-10-CM | POA: Diagnosis not present

## 2017-07-09 DIAGNOSIS — Y906 Blood alcohol level of 120-199 mg/100 ml: Secondary | ICD-10-CM | POA: Diagnosis not present

## 2017-07-09 DIAGNOSIS — F1020 Alcohol dependence, uncomplicated: Secondary | ICD-10-CM | POA: Diagnosis present

## 2017-07-09 DIAGNOSIS — F332 Major depressive disorder, recurrent severe without psychotic features: Secondary | ICD-10-CM | POA: Insufficient documentation

## 2017-07-09 DIAGNOSIS — F10920 Alcohol use, unspecified with intoxication, uncomplicated: Secondary | ICD-10-CM | POA: Insufficient documentation

## 2017-07-09 DIAGNOSIS — F17210 Nicotine dependence, cigarettes, uncomplicated: Secondary | ICD-10-CM | POA: Diagnosis not present

## 2017-07-09 DIAGNOSIS — F1024 Alcohol dependence with alcohol-induced mood disorder: Secondary | ICD-10-CM | POA: Diagnosis present

## 2017-07-09 DIAGNOSIS — Z046 Encounter for general psychiatric examination, requested by authority: Secondary | ICD-10-CM | POA: Diagnosis present

## 2017-07-09 MED ORDER — SODIUM CHLORIDE 0.9 % IV BOLUS (SEPSIS)
500.0000 mL | Freq: Once | INTRAVENOUS | Status: AC
  Administered 2017-07-10: 500 mL via INTRAVENOUS
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
## 2017-07-09 NOTE — ED Notes (Signed)
Bed: [facility] Expected date:  Expected time:  Means of arrival:  Comments: 
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
## 2017-07-10 DIAGNOSIS — F1024 Alcohol dependence with alcohol-induced mood disorder: Secondary | ICD-10-CM | POA: Diagnosis present

## 2017-07-10 LAB — CBC
HCT: 32 % — ABNORMAL LOW (ref 36.0–46.0)
HEMOGLOBIN: 11.2 g/dL — AB (ref 12.0–15.0)
MCH: 32.9 pg (ref 26.0–34.0)
MCHC: 35 g/dL (ref 30.0–36.0)
MCV: 94.1 fL (ref 78.0–100.0)
PLATELETS: 216 10*3/uL (ref 150–400)
RBC: 3.4 MIL/uL — ABNORMAL LOW (ref 3.87–5.11)
RDW: 14.5 % (ref 11.5–15.5)
WBC: 5.1 10*3/uL (ref 4.0–10.5)

## 2017-07-10 LAB — ETHANOL: ALCOHOL ETHYL (B): 149 mg/dL — AB (ref <5)

## 2017-07-10 LAB — COMPREHENSIVE METABOLIC PANEL
ALBUMIN: 4 g/dL (ref 3.5–5.0)
ALK PHOS: 95 U/L (ref 38–126)
ALT: 15 U/L (ref 14–54)
AST: 35 U/L (ref 15–41)
Anion gap: 9 (ref 5–15)
BUN: 10 mg/dL (ref 6–20)
CALCIUM: 8.1 mg/dL — AB (ref 8.9–10.3)
CHLORIDE: 102 mmol/L (ref 101–111)
CO2: 21 mmol/L — AB (ref 22–32)
CREATININE: 0.91 mg/dL (ref 0.44–1.00)
GFR calc Af Amer: >60 mL/min (ref >60)
GFR calc non Af Amer: >60 mL/min (ref >60)
GLUCOSE: 76 mg/dL (ref 65–99)
Potassium: 3.4 mmol/L — ABNORMAL LOW (ref 3.5–5.1)
SODIUM: 132 mmol/L — AB (ref 135–145)
Total Bilirubin: 0.5 mg/dL (ref 0.3–1.2)
Total Protein: 6.7 g/dL (ref 6.5–8.1)

## 2017-07-10 LAB — RAPID URINE DRUG SCREEN, HOSP PERFORMED
Amphetamines: NOT DETECTED
Barbiturates: NOT DETECTED
Benzodiazepines: POSITIVE — AB
Cocaine: NOT DETECTED
Opiates: NOT DETECTED
Tetrahydrocannabinol: NOT DETECTED

## 2017-07-10 MED ORDER — LORAZEPAM 1 MG PO TABS
0.0000 mg | ORAL_TABLET | Freq: Two times a day (BID) | ORAL | Status: DC
  Administered 2017-07-10: 1 mg via ORAL

## 2017-07-10 MED ORDER — THIAMINE HCL 100 MG/ML IJ SOLN: 100.0000 mg | Freq: Every day | INTRAMUSCULAR | Status: DC

## 2017-07-10 MED ORDER — VITAMIN B-1 100 MG PO TABS
100.0000 mg | ORAL_TABLET | Freq: Every day | ORAL | Status: DC
  Administered 2017-07-10 – 2017-07-11 (×2): 100 mg via ORAL
  Filled 2017-07-10 (×2): qty 1

## 2017-07-10 MED ORDER — POTASSIUM CHLORIDE CRYS ER 20 MEQ PO TBCR
40.0000 meq | EXTENDED_RELEASE_TABLET | Freq: Once | ORAL | Status: AC
  Administered 2017-07-10: 40 meq via ORAL
  Filled 2017-07-10: qty 2

## 2017-07-10 MED ORDER — GABAPENTIN 100 MG PO CAPS
200.0000 mg | ORAL_CAPSULE | Freq: Two times a day (BID) | ORAL | Status: DC
  Administered 2017-07-11 (×2): 200 mg via ORAL
  Filled 2017-07-10 (×2): qty 2

## 2017-07-10 MED ORDER — LORAZEPAM 2 MG/ML IJ SOLN
0.0000 mg | Freq: Four times a day (QID) | INTRAMUSCULAR | Status: DC
Start: 2017-07-10 — End: 2017-07-11
  Administered 2017-07-10: 1 mg via INTRAVENOUS
  Filled 2017-07-10: qty 1

## 2017-07-10 MED ORDER — LORAZEPAM 1 MG PO TABS
0.0000 mg | ORAL_TABLET | Freq: Four times a day (QID) | ORAL | Status: DC
  Administered 2017-07-10 – 2017-07-11 (×2): 1 mg via ORAL
  Filled 2017-07-10 (×3): qty 1

## 2017-07-10 MED ORDER — VITAMIN D3 25 MCG (1000 UNIT) PO TABS
1000.0000 [IU] | ORAL_TABLET | Freq: Every day | ORAL | Status: DC
  Administered 2017-07-11: 1000 [IU] via ORAL
  Filled 2017-07-10 (×2): qty 1

## 2017-07-10 MED ORDER — LORAZEPAM 2 MG/ML IJ SOLN: 0.0000 mg | Freq: Two times a day (BID) | INTRAMUSCULAR | Status: DC

## 2017-07-10 MED ORDER — LEVOTHYROXINE SODIUM 100 MCG PO TABS
100.0000 ug | ORAL_TABLET | Freq: Every day | ORAL | Status: DC
  Administered 2017-07-10 – 2017-07-11 (×2): 100 ug via ORAL
  Filled 2017-07-10 (×2): qty 1
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
## 2017-07-10 NOTE — ED Notes (Signed)
Pt has been seen and wand by security.  Pt's belongings are in the cabinet designated for the bed.

## 2017-07-10 NOTE — Progress Notes (Addendum)
Pt accepted at [facility], for admission on 07/11/17.  Dr. Elaina Hoops is the accepting provider.  Nurse to Nurse report # [phone]  Pt. Is IVC'd transport to be arranged for the morning.  [facility] ED Charge Nurse, notified.  Areatha Keas. Judi Cong, MSW, [place] Disposition Clinical Social Work [phone] (cell) [phone] (office)

## 2017-07-11 NOTE — ED Notes (Signed)
Patient ambulatory to bathroom with assistance

## 2017-07-11 NOTE — ED Notes (Signed)
Patient resting with eyes closed at this time.

## 2017-07-11 NOTE — ED Notes (Signed)
Report called to Mechele Claude., RN at [facility]
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
## 2017-07-11 NOTE — ED Notes (Signed)
Patient transported to [facility] via [facility]. Belongings bag x 1 given to [facility] department.

## 2017-07-11 NOTE — ED Notes (Signed)
[facility] department notified of need for transportation to [facility]. ETA approx 2 hours.
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
## 2017-07-11 NOTE — ED Notes (Signed)
Attempted to call report to [facility], but receiving nurse stated she had to make rounds and to call back in approx 10 minutes.

## 2017-07-23 IMAGING — CT CT CHEST W/ CM
2 of 5 series · 12 of 36 positions shown, 15 images · IV contrast (iopamidol)
Comparison: PET-CT from 11/10/2015

CLINICAL DATA: Followup metastatic melanoma.

EXAM:
CT CHEST, ABDOMEN, AND PELVIS WITH CONTRAST
TECHNIQUE: Multidetector CT imaging of the chest, abdomen and pelvis was
performed following the standard protocol during bolus
administration of intravenous contrast.
CONTRAST:  100mL PZZW2W-7CC IOPAMIDOL (PZZW2W-7CC) INJECTION 61%

[Series 2: cap with · axial · 0.71mm/px · z∈[+920,+1375]mm · 9 of 113 slices shown, 12 images]
[im 11/113  mediastinal]
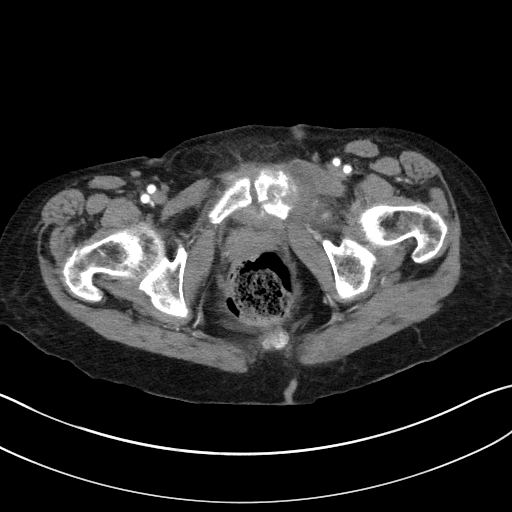
[im 11/113  lung]
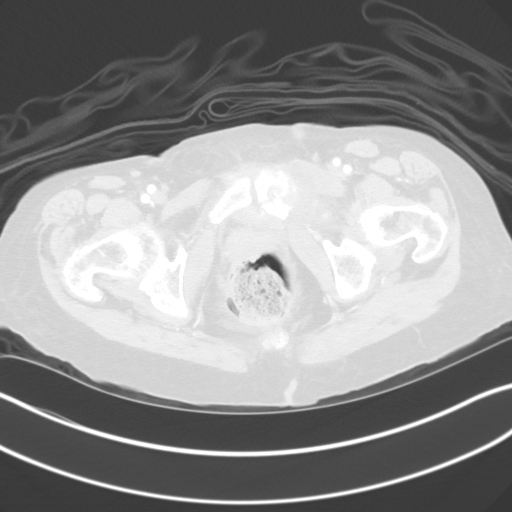
[im 21/113  lung]
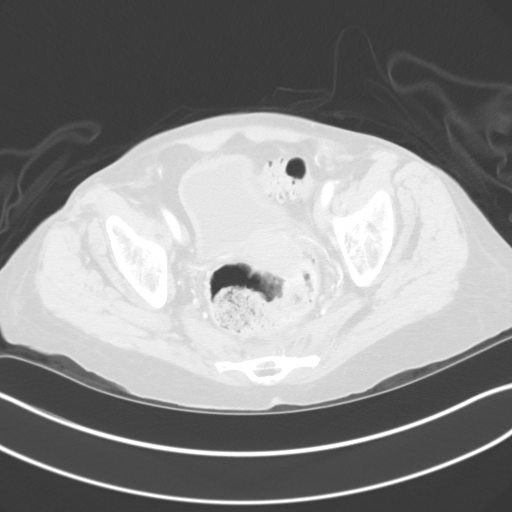
[im 31/113  lung]
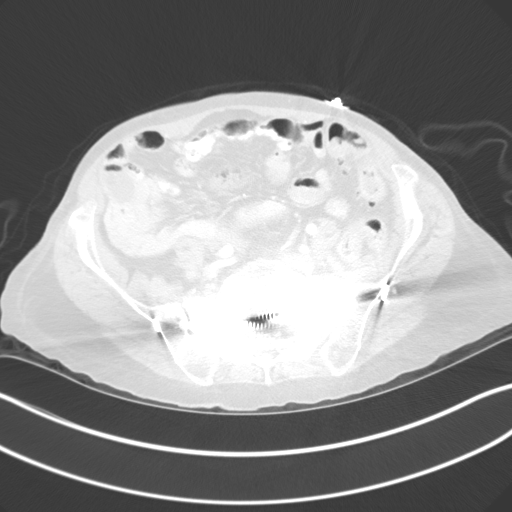
[im 41/113  lung]
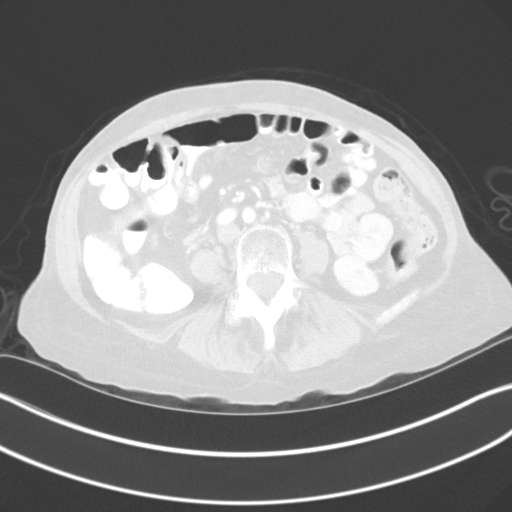
[im 62/113  mediastinal]
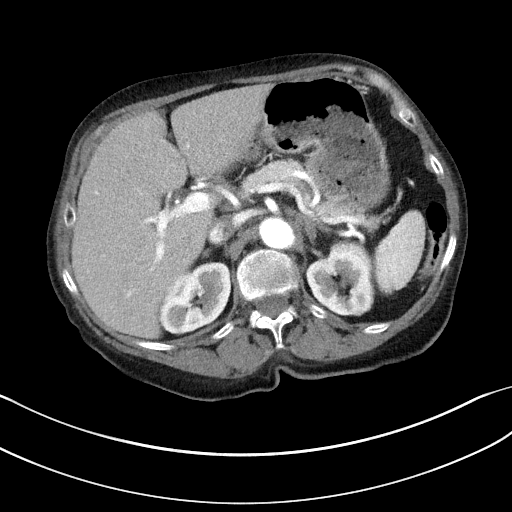
[im 62/113  lung]
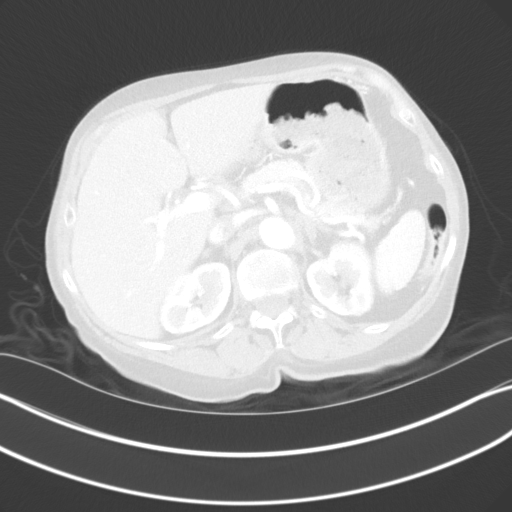
[im 72/113  lung]
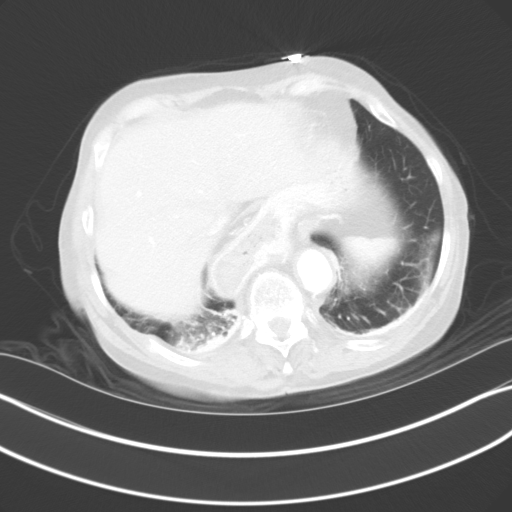
[im 82/113  lung]
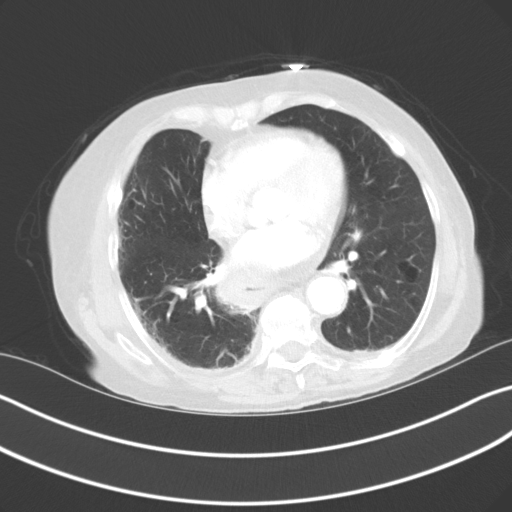
[im 92/113  lung]
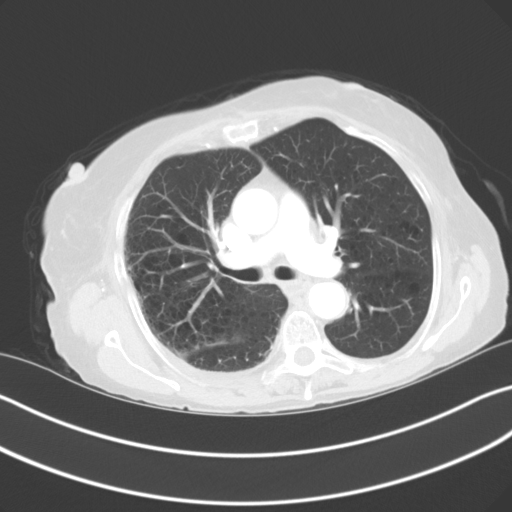
[im 102/113  mediastinal]
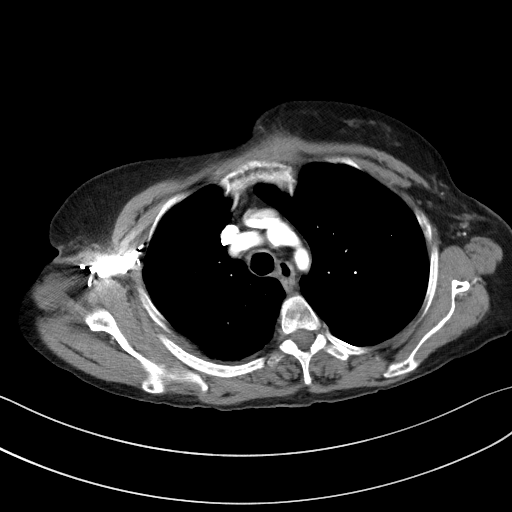
[im 102/113  lung]
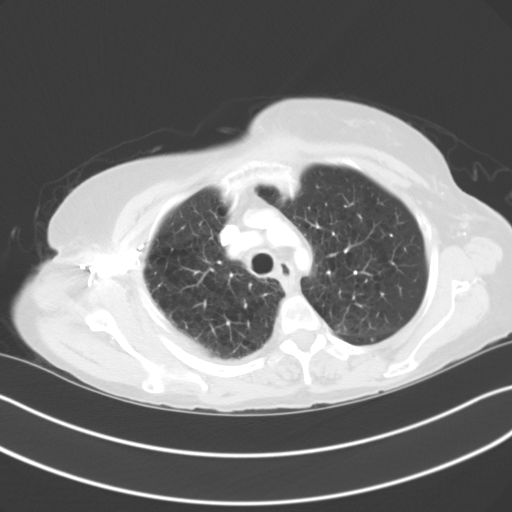

[Series 4: coronals · coronal · 0.70mm/px · 3 of 130 slices shown]
[im 26/130  lung]
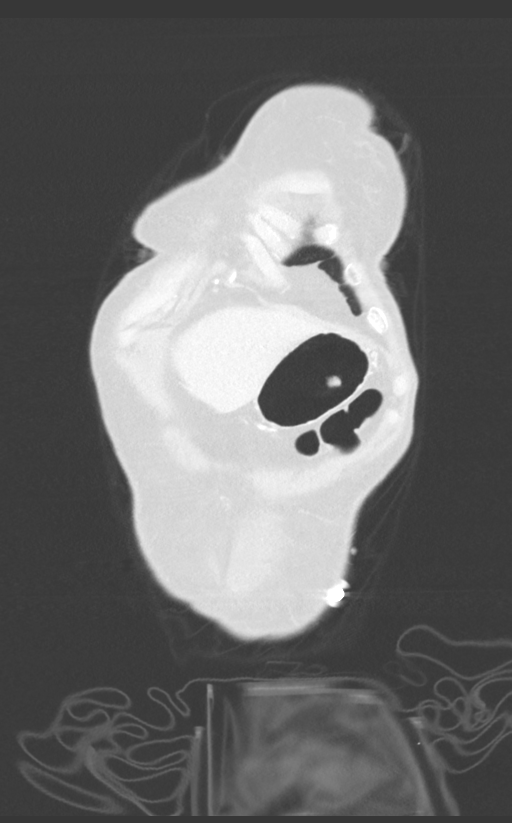
[im 52/130  lung]
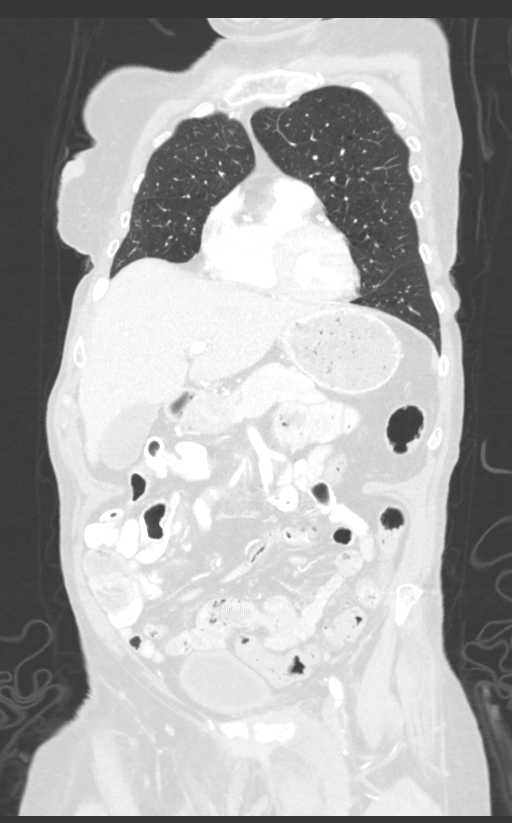
[im 78/130  lung]
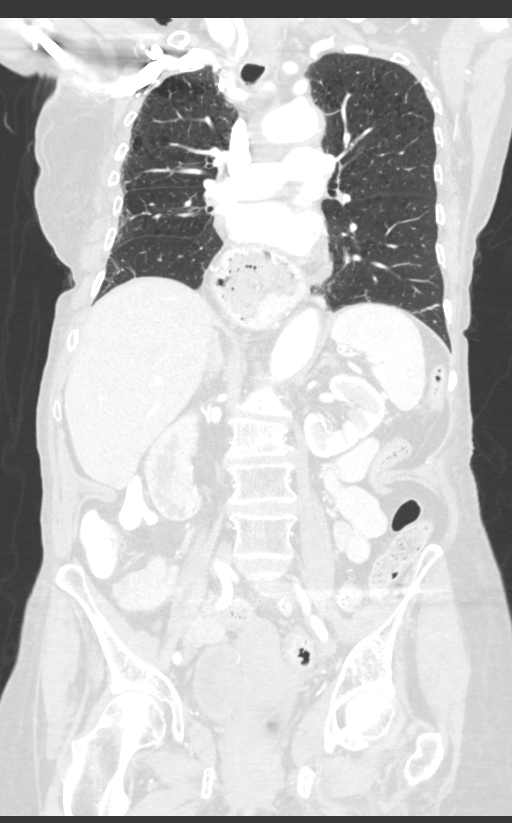

[12 of 36 positions shown; findings below may reference images not displayed]

FINDINGS: CT CHEST FINDINGS

Cardiovascular: The heart size is normal.  No pericardial effusion.

Mediastinum/Nodes: The trachea appears patent and midline. Large
hiatal hernia identified. There is no mediastinal or hilar
adenopathy. No axillary or supraclavicular adenopathy.

Lungs/Pleura: Small right pleural effusion. Moderate to advanced
changes of centrilobular and paraseptal emphysema identified. Left
upper lobe pulmonary nodule measures 4 mm, image 39 of series 6. New
from previous exam. Subsegmental atelectasis within the
posteromedial right lower lobe.

Musculoskeletal: Soft tissue expansion involving the proximal body
of sternum and sternomanubrial joint measures 4 x 3.0 x 4.5 cm.
There is associated fracture with Manage displacement of bone
fragment from the proximal body of sternum, image 86 of series 5.
Multiple chronic appearing fracture deformities involve bilateral
ribs.

CT ABDOMEN PELVIS FINDINGS

Hepatobiliary: No focal liver abnormality is seen. No gallstones,
gallbladder wall thickening, or biliary dilatation.

Pancreas: Unremarkable. No pancreatic ductal dilatation or
surrounding inflammatory changes.

Spleen: Normal in size without focal abnormality.

Adrenals/Urinary Tract: The adrenal glands are normal. There is a
small low density structure arising from the posterior cortex of the
right kidney measuring 6 mm. Too small to characterize. No mass or
hydronephrosis identified. Urinary bladder is unremarkable.

Stomach/Bowel: At least 50% intrathoracic stomach is associated with
hiatal hernia. The small bowel loops have a normal course and
caliber. No pathologic dilatation of the colon.

Vascular/Lymphatic: Aortic atherosclerosis. No aneurysm. No upper
abdominal adenopathy. There is no pelvic or inguinal adenopathy
identified.

Reproductive: Uterus and bilateral adnexa are unremarkable.

Other: There is no free fluid or fluid collections within the
abdomen or pelvis. Small periumbilical hernia is noted which
contains fat only.

Musculoskeletal: There are 2 syndesmotic screws reducing bilateral
sacral insufficiency fractures. There is a fracture deformity
involving the left superior and inferior pubic rami, image number
104 of series 2 and image 107 of series 2. Age indeterminate
fracture through the mid L2 vertebral body without significant loss
of vertebral body height.
IMPRESSION: 1. There is a small pulmonary nodule within the left upper lobe,
indeterminate measuring 4 mm.
2. There are multiple bilateral rib fracture deformities as well as
fractures involving the left superior and inferior pubic. Bilateral
sacral insufficiency fractures are noted and have been treated with
syndesmotic screws. Age indeterminate fracture deformity through the
mid L2 vertebral body without significant loss vertebral body
height.
3. Fracture involving the proximal body of the sternum is identified
with ventral displacement of fracture fragment. There is masslike
enlargement of the surrounding soft tissues. Cannot rule out
metastatic disease with associated pathologic fracture in this
patient who has a history of metastatic melanoma. Consider
correlation with tissue sampling.
4. Large hiatal hernia.
5.  Emphysema (PXXDG-WGJ.E). Aortic aneurysm NOS (PXXDG-DQK.8).

## 2017-07-29 ENCOUNTER — Encounter (HOSPITAL_COMMUNITY): Payer: Self-pay | Admitting: *Deleted

## 2017-07-29 ENCOUNTER — Telehealth: Payer: Self-pay

## 2017-07-29 ENCOUNTER — Emergency Department (HOSPITAL_COMMUNITY)
Admission: EM | Admit: 2017-07-29 | Discharge: 2017-07-29 | Disposition: A | Discharge status: Home / Self Care | Payer: BLUE CROSS/BLUE SHIELD | Attending: Emergency Medicine | Admitting: Emergency Medicine

## 2017-07-29 DIAGNOSIS — E039 Hypothyroidism, unspecified: Secondary | ICD-10-CM | POA: Insufficient documentation

## 2017-07-29 DIAGNOSIS — Z79899 Other long term (current) drug therapy: Secondary | ICD-10-CM | POA: Insufficient documentation

## 2017-07-29 DIAGNOSIS — F17210 Nicotine dependence, cigarettes, uncomplicated: Secondary | ICD-10-CM | POA: Insufficient documentation

## 2017-07-29 DIAGNOSIS — F419 Anxiety disorder, unspecified: Secondary | ICD-10-CM | POA: Insufficient documentation

## 2017-07-29 LAB — CBC
HCT: 35.9 % — ABNORMAL LOW (ref 36.0–46.0)
HEMOGLOBIN: 12.5 g/dL (ref 12.0–15.0)
MCH: 32.2 pg (ref 26.0–34.0)
MCHC: 34.8 g/dL (ref 30.0–36.0)
MCV: 92.5 fL (ref 78.0–100.0)
Platelets: 270 10*3/uL (ref 150–400)
RBC: 3.88 MIL/uL (ref 3.87–5.11)
RDW: 14.1 % (ref 11.5–15.5)
WBC: 7.1 10*3/uL (ref 4.0–10.5)

## 2017-07-29 LAB — COMPREHENSIVE METABOLIC PANEL
ALBUMIN: 4.7 g/dL (ref 3.5–5.0)
ALT: 23 U/L (ref 14–54)
ANION GAP: 11 (ref 5–15)
AST: 35 U/L (ref 15–41)
Alkaline Phosphatase: 133 U/L — ABNORMAL HIGH (ref 38–126)
BILIRUBIN TOTAL: 1 mg/dL (ref 0.3–1.2)
BUN: 13 mg/dL (ref 6–20)
CO2: 26 mmol/L (ref 22–32)
Calcium: 9.1 mg/dL (ref 8.9–10.3)
Chloride: 96 mmol/L — ABNORMAL LOW (ref 101–111)
Creatinine, Ser: 0.66 mg/dL (ref 0.44–1.00)
GFR calc Af Amer: >60 mL/min (ref >60)
GFR calc non Af Amer: >60 mL/min (ref >60)
GLUCOSE: 96 mg/dL (ref 65–99)
POTASSIUM: 3.6 mmol/L (ref 3.5–5.1)
SODIUM: 133 mmol/L — AB (ref 135–145)
TOTAL PROTEIN: 7.9 g/dL (ref 6.5–8.1)

## 2017-07-29 LAB — ETHANOL: Alcohol, Ethyl (B): <5 mg/dL (ref <5)

## 2017-07-29 LAB — RAPID URINE DRUG SCREEN, HOSP PERFORMED
AMPHETAMINES: NOT DETECTED
BENZODIAZEPINES: NOT DETECTED
Barbiturates: NOT DETECTED
COCAINE: NOT DETECTED
OPIATES: NOT DETECTED
TETRAHYDROCANNABINOL: NOT DETECTED

## 2017-07-29 MED ORDER — LORAZEPAM 1 MG PO TABS
1.0000 mg | ORAL_TABLET | Freq: Once | ORAL | Status: AC
  Administered 2017-07-29: 1 mg via ORAL
  Filled 2017-07-29: qty 1

## 2017-07-29 NOTE — Progress Notes (Signed)
Patient is severely anxious and yelling out.

## 2017-07-29 NOTE — Discharge Instructions (Signed)
Take your medications as directed.  Follow-up with your primary care doctor for checkup in 1 or 2 weeks.  Follow-up with your psychiatrist and therapist as soon as possible.

## 2017-07-29 NOTE — Progress Notes (Signed)
Social Worker has arranged for transportation for patient. Patient is waiting for taxi.
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
## 2017-07-29 NOTE — Telephone Encounter (Signed)
Message received from Andria Meuse, RN CM requesting a hospital follow up appointment for the patient.  Informed her that at this time, there are not any appointments available at [facility].
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
## 2017-07-29 NOTE — Progress Notes (Addendum)
Pt states she has $24 cash on her and asked CSW to call a taxi.  CSW called [employer] and the fare will be approx $15.25.   CSW will call pt's brother to inform him pt is D/C'ing by taxt and brother Judd Lien at ph: [phone] will coordinate a locksmith meeting the pt at her home.  CSW will update RN.    6:09 PM   CSW received a call from pt's friend Kierstin January at ph: [phone] asking if pt needed help.  CSW stated pt is fine now but CSW will provide pt with her brother and her friend's number just in case they are needed at D/C.  Alphonse Guild. Amillia Biffle, LCSWA, Deon Pilling, CSI Clinical Social Worker Ph: [phone]

## 2017-07-30 ENCOUNTER — Encounter (HOSPITAL_COMMUNITY): Payer: Self-pay | Admitting: Emergency Medicine

## 2017-07-30 ENCOUNTER — Emergency Department (HOSPITAL_COMMUNITY)
Admission: EM | Admit: 2017-07-30 | Discharge: 2017-07-30 | Disposition: A | Discharge status: Home / Self Care | Payer: Self-pay | Attending: Emergency Medicine | Admitting: Emergency Medicine

## 2017-07-30 DIAGNOSIS — F17210 Nicotine dependence, cigarettes, uncomplicated: Secondary | ICD-10-CM | POA: Insufficient documentation

## 2017-07-30 DIAGNOSIS — Z79899 Other long term (current) drug therapy: Secondary | ICD-10-CM | POA: Insufficient documentation

## 2017-07-30 DIAGNOSIS — I10 Essential (primary) hypertension: Secondary | ICD-10-CM | POA: Insufficient documentation

## 2017-07-30 DIAGNOSIS — F419 Anxiety disorder, unspecified: Secondary | ICD-10-CM | POA: Insufficient documentation
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
## 2017-07-30 NOTE — ED Notes (Signed)
Pt left without getting discharge paperwork. Pt assisted out in a wheelchair. Pt had originally signed AMA form before the doctor put her up for discharge

## 2017-07-30 NOTE — ED Notes (Signed)
ED Provider at bedside. 

## 2017-07-31 ENCOUNTER — Emergency Department (HOSPITAL_COMMUNITY)
Admission: EM | Admit: 2017-07-31 | Discharge: 2017-08-01 | Disposition: A | Discharge status: Home / Self Care | Payer: Self-pay | Attending: Emergency Medicine | Admitting: Emergency Medicine

## 2017-07-31 ENCOUNTER — Encounter (HOSPITAL_COMMUNITY): Payer: Self-pay

## 2017-07-31 DIAGNOSIS — F1010 Alcohol abuse, uncomplicated: Secondary | ICD-10-CM | POA: Insufficient documentation

## 2017-07-31 DIAGNOSIS — F17210 Nicotine dependence, cigarettes, uncomplicated: Secondary | ICD-10-CM | POA: Insufficient documentation

## 2017-07-31 DIAGNOSIS — R451 Restlessness and agitation: Secondary | ICD-10-CM | POA: Insufficient documentation

## 2017-07-31 DIAGNOSIS — G8929 Other chronic pain: Secondary | ICD-10-CM | POA: Insufficient documentation

## 2017-07-31 DIAGNOSIS — Y905 Blood alcohol level of 100-119 mg/100 ml: Secondary | ICD-10-CM | POA: Insufficient documentation

## 2017-07-31 DIAGNOSIS — Z79899 Other long term (current) drug therapy: Secondary | ICD-10-CM | POA: Insufficient documentation

## 2017-07-31 DIAGNOSIS — M25512 Pain in left shoulder: Secondary | ICD-10-CM | POA: Insufficient documentation

## 2017-07-31 DIAGNOSIS — E890 Postprocedural hypothyroidism: Secondary | ICD-10-CM | POA: Insufficient documentation

## 2017-07-31 LAB — COMPREHENSIVE METABOLIC PANEL
ALBUMIN: 4.2 g/dL (ref 3.5–5.0)
ALT: 17 U/L (ref 14–54)
ANION GAP: 12 (ref 5–15)
AST: 27 U/L (ref 15–41)
Alkaline Phosphatase: 108 U/L (ref 38–126)
BUN: 12 mg/dL (ref 6–20)
CHLORIDE: 105 mmol/L (ref 101–111)
CO2: 21 mmol/L — AB (ref 22–32)
Calcium: 8.3 mg/dL — ABNORMAL LOW (ref 8.9–10.3)
Creatinine, Ser: 0.74 mg/dL (ref 0.44–1.00)
GFR calc Af Amer: >60 mL/min (ref >60)
GFR calc non Af Amer: >60 mL/min (ref >60)
GLUCOSE: 81 mg/dL (ref 65–99)
POTASSIUM: 3.3 mmol/L — AB (ref 3.5–5.1)
SODIUM: 138 mmol/L (ref 135–145)
TOTAL PROTEIN: 6.7 g/dL (ref 6.5–8.1)
Total Bilirubin: 0.4 mg/dL (ref 0.3–1.2)

## 2017-07-31 LAB — CBC
HCT: 31.6 % — ABNORMAL LOW (ref 36.0–46.0)
Hemoglobin: 10.9 g/dL — ABNORMAL LOW (ref 12.0–15.0)
MCH: 32 pg (ref 26.0–34.0)
MCHC: 34.5 g/dL (ref 30.0–36.0)
MCV: 92.7 fL (ref 78.0–100.0)
PLATELETS: 197 10*3/uL (ref 150–400)
RBC: 3.41 MIL/uL — ABNORMAL LOW (ref 3.87–5.11)
RDW: 14.2 % (ref 11.5–15.5)
WBC: 5 10*3/uL (ref 4.0–10.5)

## 2017-07-31 LAB — TSH: TSH: 38.264 u[IU]/mL — ABNORMAL HIGH (ref 0.350–4.500)

## 2017-07-31 LAB — ETHANOL: Alcohol, Ethyl (B): 108 mg/dL — ABNORMAL HIGH (ref <5)

## 2017-07-31 MED ORDER — LEVOTHYROXINE SODIUM 100 MCG PO TABS: 100.0000 ug | ORAL_TABLET | Freq: Every day | ORAL | 0 refills | Status: DC

## 2017-07-31 MED ORDER — LORAZEPAM 2 MG/ML IJ SOLN
1.0000 mg | Freq: Once | INTRAMUSCULAR | Status: AC
  Administered 2017-07-31: 1 mg via INTRAMUSCULAR
  Filled 2017-07-31: qty 1

## 2017-07-31 MED ORDER — POTASSIUM CHLORIDE CRYS ER 20 MEQ PO TBCR: 20.0000 meq | EXTENDED_RELEASE_TABLET | Freq: Once | ORAL | Status: DC

## 2017-07-31 NOTE — Discharge Instructions (Signed)
Fill your prescription for levothyroxine at the pharmacy  Take 100mg  of Levothyroxine every day and make an appointment with Dr. Marin Olp for recheck of your thyroid levels  Please come back to the ER for new or worsening symptoms.
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
## 2017-07-31 NOTE — ED Notes (Signed)
Blood work delayed until patient receives medicine. RN notified

## 2017-07-31 NOTE — ED Notes (Signed)
Bed: YS06 Expected date:  Expected time:  Means of arrival:  Comments: EMS [id number]
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
## 2017-07-31 NOTE — ED Notes (Signed)
Pt currently too agitated and will not comply with giving a urine sample.  RN notified.

## 2017-08-01 NOTE — ED Notes (Signed)
Social worker is working on a ride home.

## 2017-08-01 NOTE — ED Notes (Signed)
Patient reports that no one is able to come pick her up. States that she was not able to get her purse from home when they picked her up. Requesting to speak with Education officer, museum.

## 2017-08-04 IMAGING — CR DG CHEST 2V
2 series · 2 of 2 positions shown · non-contrast
Comparison: 06/17/2017

CLINICAL DATA: Chest pain.

EXAM:
CHEST  2 VIEW

[w chest lat]
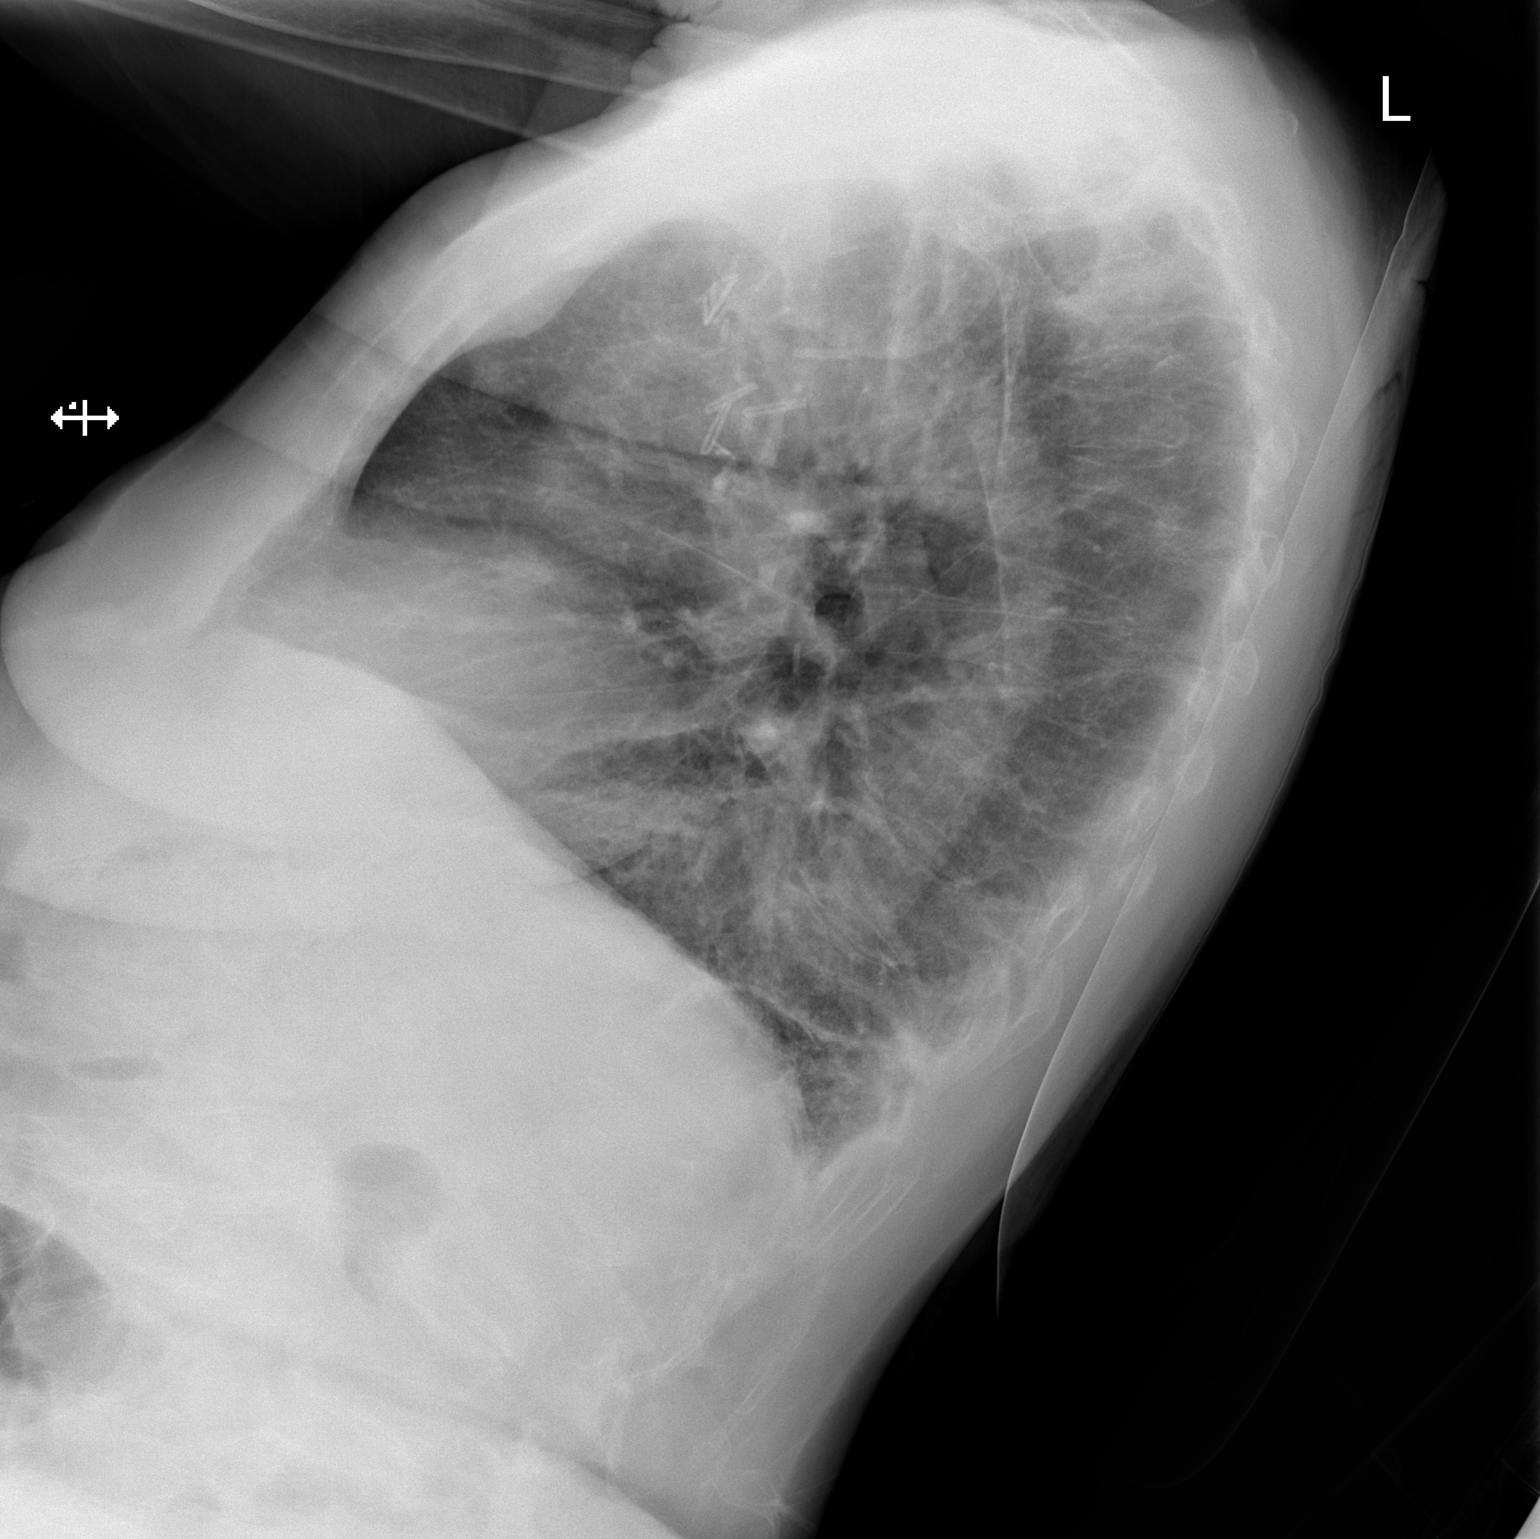

[x chest ap]
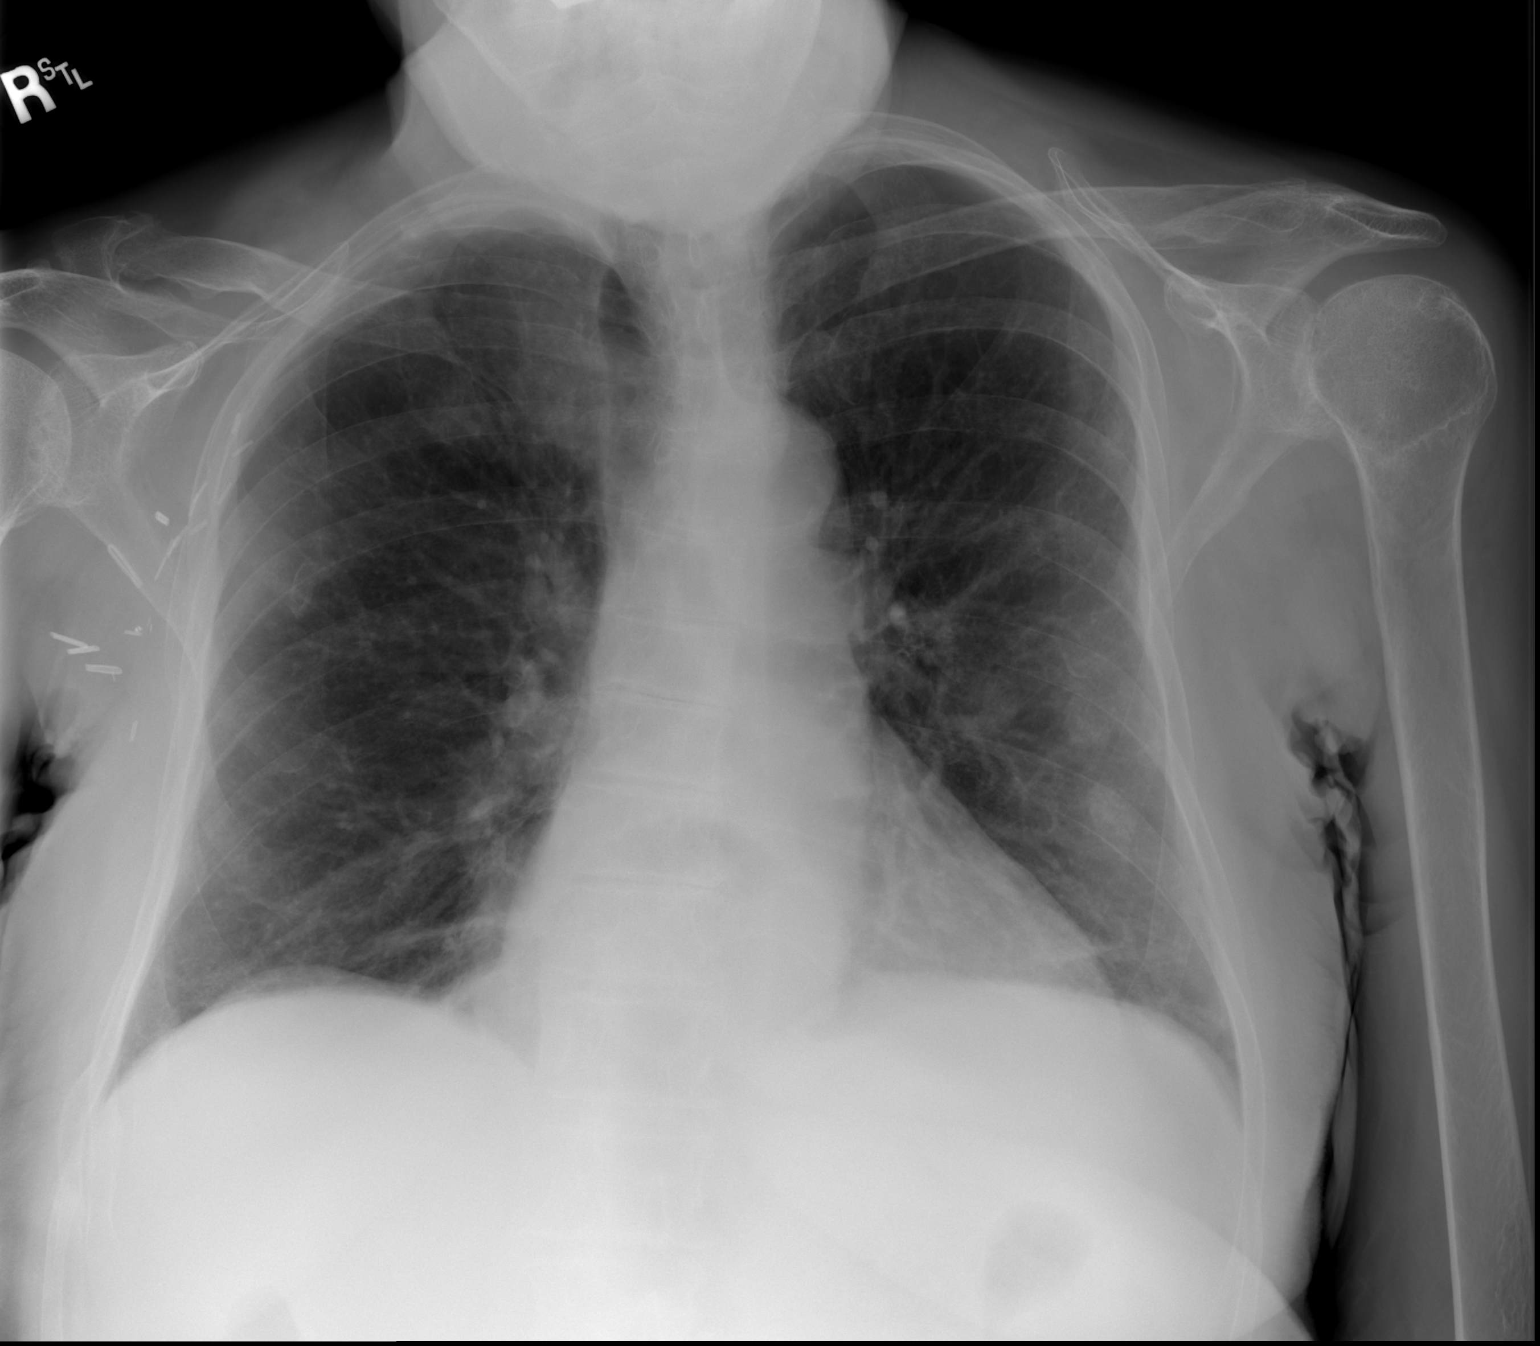

[2 of 2 positions shown; findings below may reference images not displayed]

FINDINGS: Normal heart size. No pleural effusion. Moderate size hiatal hernia.
Chronic left rib fracture deformities noted. No superimposed
airspace consolidation identified. Scoliosis deformity is
identified.
IMPRESSION: 1. No acute cardiopulmonary abnormalities
2. Hiatal hernia.

## 2017-08-05 ENCOUNTER — Encounter (HOSPITAL_COMMUNITY): Payer: Self-pay | Admitting: *Deleted

## 2017-08-05 ENCOUNTER — Emergency Department (HOSPITAL_COMMUNITY)
Admission: EM | Admit: 2017-08-05 | Discharge: 2017-08-06 | Disposition: A | Discharge status: Home / Self Care | Payer: BLUE CROSS/BLUE SHIELD | Attending: Emergency Medicine | Admitting: Emergency Medicine

## 2017-08-05 DIAGNOSIS — R45851 Suicidal ideations: Secondary | ICD-10-CM | POA: Insufficient documentation

## 2017-08-05 DIAGNOSIS — Z79899 Other long term (current) drug therapy: Secondary | ICD-10-CM | POA: Insufficient documentation

## 2017-08-05 DIAGNOSIS — F419 Anxiety disorder, unspecified: Secondary | ICD-10-CM | POA: Diagnosis present

## 2017-08-05 DIAGNOSIS — E039 Hypothyroidism, unspecified: Secondary | ICD-10-CM | POA: Diagnosis not present

## 2017-08-05 DIAGNOSIS — F22 Delusional disorders: Secondary | ICD-10-CM

## 2017-08-05 DIAGNOSIS — M549 Dorsalgia, unspecified: Secondary | ICD-10-CM | POA: Insufficient documentation

## 2017-08-05 DIAGNOSIS — Z046 Encounter for general psychiatric examination, requested by authority: Secondary | ICD-10-CM | POA: Diagnosis not present

## 2017-08-05 DIAGNOSIS — F329 Major depressive disorder, single episode, unspecified: Secondary | ICD-10-CM | POA: Diagnosis not present

## 2017-08-05 DIAGNOSIS — C439 Malignant melanoma of skin, unspecified: Secondary | ICD-10-CM | POA: Insufficient documentation

## 2017-08-05 DIAGNOSIS — Z85850 Personal history of malignant neoplasm of thyroid: Secondary | ICD-10-CM | POA: Insufficient documentation

## 2017-08-05 DIAGNOSIS — F32A Depression, unspecified: Secondary | ICD-10-CM

## 2017-08-05 DIAGNOSIS — F332 Major depressive disorder, recurrent severe without psychotic features: Secondary | ICD-10-CM | POA: Diagnosis present

## 2017-08-05 DIAGNOSIS — F17210 Nicotine dependence, cigarettes, uncomplicated: Secondary | ICD-10-CM | POA: Diagnosis not present

## 2017-08-05 LAB — RAPID URINE DRUG SCREEN, HOSP PERFORMED
Amphetamines: NOT DETECTED
BARBITURATES: NOT DETECTED
Benzodiazepines: NOT DETECTED
Cocaine: NOT DETECTED
Opiates: NOT DETECTED
TETRAHYDROCANNABINOL: NOT DETECTED

## 2017-08-05 LAB — CBC
HEMATOCRIT: 34.1 % — AB (ref 36.0–46.0)
Hemoglobin: 11.9 g/dL — ABNORMAL LOW (ref 12.0–15.0)
MCH: 31.9 pg (ref 26.0–34.0)
MCHC: 34.9 g/dL (ref 30.0–36.0)
MCV: 91.4 fL (ref 78.0–100.0)
Platelets: 248 10*3/uL (ref 150–400)
RBC: 3.73 MIL/uL — ABNORMAL LOW (ref 3.87–5.11)
RDW: 14.1 % (ref 11.5–15.5)
WBC: 7.5 10*3/uL (ref 4.0–10.5)

## 2017-08-05 LAB — COMPREHENSIVE METABOLIC PANEL
ALBUMIN: 4.6 g/dL (ref 3.5–5.0)
ALK PHOS: 116 U/L (ref 38–126)
ALT: 18 U/L (ref 14–54)
AST: 31 U/L (ref 15–41)
Anion gap: 15 (ref 5–15)
BUN: 18 mg/dL (ref 6–20)
CALCIUM: 8.7 mg/dL — AB (ref 8.9–10.3)
CHLORIDE: 93 mmol/L — AB (ref 101–111)
CO2: 22 mmol/L (ref 22–32)
CREATININE: 0.75 mg/dL (ref 0.44–1.00)
GFR calc Af Amer: >60 mL/min (ref >60)
GFR calc non Af Amer: >60 mL/min (ref >60)
Glucose, Bld: 76 mg/dL (ref 65–99)
Potassium: 3.7 mmol/L (ref 3.5–5.1)
SODIUM: 130 mmol/L — AB (ref 135–145)
Total Bilirubin: 1 mg/dL (ref 0.3–1.2)
Total Protein: 7.3 g/dL (ref 6.5–8.1)

## 2017-08-05 LAB — ETHANOL

## 2017-08-05 MED ORDER — LORAZEPAM 1 MG PO TABS
1.0000 mg | ORAL_TABLET | Freq: Once | ORAL | Status: AC
  Administered 2017-08-05: 1 mg via ORAL
  Filled 2017-08-05: qty 1

## 2017-08-05 MED ORDER — ONDANSETRON 4 MG PO TBDP
4.0000 mg | ORAL_TABLET | Freq: Three times a day (TID) | ORAL | Status: DC | PRN
  Administered 2017-08-05: 4 mg via ORAL
  Filled 2017-08-05: qty 1

## 2017-08-05 NOTE — ED Notes (Signed)
Bed: [facility] Expected date:  Expected time:  Means of arrival:  Comments: 
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
## 2017-08-05 NOTE — ED Notes (Signed)
Dr. Tamera Punt to speak to pt.

## 2017-08-05 NOTE — ED Triage Notes (Signed)
Pt admits to ETOH use today and is now very anxious. L arm pain from a fall yesterday. Alert and oriented.
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
## 2017-08-05 NOTE — ED Notes (Signed)
Pt stated "I'm having sharp pains in my left chest.  I have so much anxiety.  I need something for anxiety."  Informed pt will speak to Dr. Tamera Punt.
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
## 2017-08-06 ENCOUNTER — Telehealth: Payer: Self-pay

## 2017-08-06 DIAGNOSIS — F17210 Nicotine dependence, cigarettes, uncomplicated: Secondary | ICD-10-CM | POA: Diagnosis not present

## 2017-08-06 DIAGNOSIS — F419 Anxiety disorder, unspecified: Secondary | ICD-10-CM | POA: Diagnosis not present

## 2017-08-06 DIAGNOSIS — F329 Major depressive disorder, single episode, unspecified: Secondary | ICD-10-CM | POA: Diagnosis not present

## 2017-08-06 DIAGNOSIS — Z79899 Other long term (current) drug therapy: Secondary | ICD-10-CM | POA: Diagnosis not present

## 2017-08-06 NOTE — Discharge Instructions (Signed)
For your behavioral health needs, you are advised to follow up with the [facility].  Contact them at your earliest opportunity to ask about scheduling an intake appointment:       The [facility]      [address] [place], [place] [address]      [phone]
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
## 2017-08-06 NOTE — Consult Note (Signed)
[facility] Psychiatry Consult   Reason for Consult:  Anxiety Referring Physician:  EDP Patient Identification: Susan Mccarthy MRN:  [id number] Principal Diagnosis: Anxiety Diagnosis:   Patient Active Problem List   Diagnosis Date Noted  . 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 Major depressive disorder, recurrent severe without psychotic features ([place]) [F33.2] 06/30/2017  . Hypotension [I95.9] 06/14/2017  . Hypokalemia [E87.6] 06/14/2017  . AKI (acute kidney injury) ([place]) [N17.9] 06/14/2017  . Normocytic anemia [D64.9] 06/14/2017  . Hyponatremia [E87.1] 06/14/2017  . Alcoholic intoxication without complication ([employer]) [K80.034]   . Anxiety [F41.9]   . Hypothyroid [E03.9] 05/04/2017  . Paranoia ([place]) [F22]   . Generalized anxiety disorder [F41.1] 08/06/2016  . Panic disorder [F41.0] 08/06/2016  . Alcohol use disorder, severe, dependence ([place]) [F10.20] 06/25/2016  . Alcohol withdrawal ([place]) [F10.239] 06/25/2016  . Opioid use disorder, moderate, dependence ([place]) [F11.20] 06/25/2016  . Bipolar disorder, current episode mixed ([place]) [F31.60] 06/25/2016  . Bulimia nervosa [F50.2] 06/25/2016  . 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 GERD (gastroesophageal reflux disease) [K21.9] 01/12/2013  . Cancer of thyroid ([place]) [C73] 12/19/2012  . Malignant melanoma, metastatic ([place]) [C79.9] 12/10/2012  . Arthritis, degenerative [M19.90] 12/10/2012    Total Time spent with patient: 45 minutes  Subjective:   Susan Mccarthy is a 66 y.o. female patient admitted with overwhelming anxiety.  HPI:  Pt was seen and chart reviewed with Dr Darleene Cleaver. Pt presented to the [facility], voluntarily, due to overwhelming anxiety. Pt is well known to this hospital system with 18 emergency room visits in the past 6 months. Pt fails to follow up with outpatient resources provided to her. Pt has a history of drug and alcohol abuse and will  present to the emergency room seeking benzodiazepines for her anxiety. Pt is not prescribed any benzos according to [employer] ([place]). Pt is psychiatrically clear for discharge with instructions to follow up at [facility].   Past Psychiatric History: As above  Risk to Self: None Risk to Others: None Prior Inpatient Therapy: Prior Inpatient Therapy: Yes (Per chart. ) Prior Therapy Dates: Per chart, 1993 Prior Therapy Facilty/Provider(s): Per chart, [facility].   Reason for Treatment: Per chart, depression Prior Outpatient Therapy: Prior Outpatient Therapy: Yes (Per chart. ) Prior Therapy Dates: UTA Prior Therapy Facilty/Provider(s): Pt reported, Malissa Hippo Reason for Treatment: Counseling.  Does patient have an ACCT team?: No Does patient have Intensive In-House Services?  : No Does patient have [facility] services? : No Does patient have P4CC services?: No  Past Medical History:  Past Medical History:  Diagnosis Date  . Anxiety   . Arthritis   . Depression   . ETOH abuse   . History of chemotherapy    INTERFERON  . Insomnia   . Melanoma ([place]) 1980   RIGHT BACK  . Thyroid ca ([place])   . Thyroid disease    85 % BENIGN    Past Surgical History:  Procedure Laterality Date  . AXILLARY NODE DISSECTION  08/02/11   right axillary,  . Excision of melanoma  1980  . PERCUTANEOUS PINNING Left 08/27/2013   Procedure: PERCUTANEOUS PINNING EXTREMITY;  Surgeon: Rozanna Box, MD;  Location: [place];  Service: Orthopedics;  Laterality: Left;  . RASH     ON FACE AND HANDS S/P CHEMOTHERAPY  . SACRO-ILIAC PINNING Bilateral 08/27/2013   Procedure: Dub Mikes;  Surgeon: Rozanna Box, MD;  Location: [place];  Service: Orthopedics;  Laterality: Bilateral;  Handy Bed, OIC Cannulated Screws  . THYROIDECTOMY  1/14   cancer   Family History:  Family History  Problem Relation Age of Onset  . ALS Father    Family Psychiatric  History: Unknown Social  History:  History  Alcohol Use  . 0.0 oz/week     History  Drug Use No    Social History   Social History  . Marital status: Married    Spouse name: N/A  . Number of children: N/A  . Years of education: N/A   Social History Main Topics  . Smoking status: Current Some Day Smoker    Packs/day: 0.25    Years: 5.00    Types: Cigarettes    Start date: 01/11/1974    Last attempt to quit: 06/07/1979  . Smokeless tobacco: Never Used     Comment: quit 35 years  . Alcohol use 0.0 oz/week  . Drug use: No  . Sexual activity: Not Currently   Other Topics Concern  . None   Social History Narrative   Married   Stressful marriage   Additional Social History:    Allergies:   Allergies  Allergen Reactions  . Other Rash and Other (See Comments)    Pt states that she is allergic to anything scented.    Labs:  Results for orders placed or performed during the hospital encounter of 08/05/17 (from the past 48 hour(s))  Comprehensive metabolic panel     Status: Abnormal   Collection Time: 08/05/17  8:35 PM  Result Value Ref Range   Sodium 130 (L) 135 - 145 mmol/L   Potassium 3.7 3.5 - 5.1 mmol/L   Chloride 93 (L) 101 - 111 mmol/L   CO2 22 22 - 32 mmol/L   Glucose, Bld 76 65 - 99 mg/dL   BUN 18 6 - 20 mg/dL   Creatinine, Ser 0.75 0.44 - 1.00 mg/dL   Calcium 8.7 (L) 8.9 - 10.3 mg/dL   Total Protein 7.3 6.5 - 8.1 g/dL   Albumin 4.6 3.5 - 5.0 g/dL   AST 31 15 - 41 U/L   ALT 18 14 - 54 U/L   Alkaline Phosphatase 116 38 - 126 U/L   Total Bilirubin 1.0 0.3 - 1.2 mg/dL   GFR calc non Af Amer >60 >60 mL/min   GFR calc Af Amer >60 >60 mL/min    Comment: (NOTE) The eGFR has been calculated using the CKD EPI equation. This calculation has not been validated in all clinical situations. eGFR's persistently <60 mL/min signify possible Chronic Kidney Disease.    Anion gap 15 5 - 15  Ethanol     Status: None   Collection Time: 08/05/17  8:35 PM  Result Value Ref Range   Alcohol, Ethyl  (B) <5 <5 mg/dL    Comment:        LOWEST DETECTABLE LIMIT FOR SERUM ALCOHOL IS 5 mg/dL FOR MEDICAL PURPOSES ONLY   cbc     Status: Abnormal   Collection Time: 08/05/17  8:35 PM  Result Value Ref Range   WBC 7.5 4.0 - 10.5 K/uL   RBC 3.73 (L) 3.87 - 5.11 MIL/uL   Hemoglobin 11.9 (L) 12.0 - 15.0 g/dL   HCT 34.1 (L) 36.0 - 46.0 %   MCV 91.4 78.0 - 100.0 fL   MCH 31.9 26.0 - 34.0 pg   MCHC 34.9 30.0 - 36.0 g/dL   RDW 14.1 11.5 - 15.5 %   Platelets 248  150 - 400 K/uL  Rapid urine drug screen (hospital performed)     Status: None   Collection Time: 08/05/17 11:18 PM  Result Value Ref Range   Opiates NONE DETECTED NONE DETECTED   Cocaine NONE DETECTED NONE DETECTED   Benzodiazepines NONE DETECTED NONE DETECTED   Amphetamines NONE DETECTED NONE DETECTED   Tetrahydrocannabinol NONE DETECTED NONE DETECTED   Barbiturates NONE DETECTED NONE DETECTED    Comment:        DRUG SCREEN FOR MEDICAL PURPOSES ONLY.  IF CONFIRMATION IS NEEDED FOR ANY PURPOSE, NOTIFY LAB WITHIN 5 DAYS.        LOWEST DETECTABLE LIMITS FOR URINE DRUG SCREEN Drug Class       Cutoff (ng/mL) Amphetamine      1000 Barbiturate      200 Benzodiazepine   417 Tricyclics       408 Opiates          300 Cocaine          300 THC              50     Current Facility-Administered Medications  Medication Dose Route Frequency Provider Last Rate Last Dose  . ondansetron (ZOFRAN-ODT) disintegrating tablet 4 mg  4 mg Oral Q8H PRN Blanchie Dessert, MD   4 mg at 08/05/17 1448   Current Outpatient Prescriptions  Medication Sig Dispense Refill  . cholecalciferol (VITAMIN D) 1000 units tablet Take 1,000 Units by mouth daily.    . fexofenadine-pseudoephedrine (ALLEGRA-D 24) 180-240 MG 24 hr tablet Take 1 tablet by mouth daily.    Marland Kitchen levothyroxine (SYNTHROID, LEVOTHROID) 100 MCG tablet Take 1 tablet (100 mcg total) by mouth daily before breakfast. 30 tablet 0  . Multiple Vitamin (MULTIVITAMIN WITH MINERALS) TABS tablet Take 1  tablet by mouth daily.    Marland Kitchen thiamine (B-1) 100 MG/ML injection Inject 1 mL (100 mg total) into the vein daily. 25 mL 0   Facility-Administered Medications Ordered in Other Encounters  Medication Dose Route Frequency Provider Last Rate Last Dose  . LORazepam (ATIVAN) injection 0.5 mg  0.5 mg Intravenous Once Volanda Napoleon, MD        Musculoskeletal: Strength & Muscle Tone: within normal limits Gait & Station: normal Patient leans: N/A  Psychiatric Specialty Exam: Physical Exam  Constitutional: She is oriented to person, place, and time. She appears well-developed.  Respiratory: Effort normal.  Musculoskeletal: Normal range of motion.  Neurological: She is alert and oriented to person, place, and time.  Psychiatric: Her speech is normal and behavior is normal. Thought content normal. Her mood appears anxious. Cognition and memory are normal. She expresses impulsivity.    Review of Systems  Psychiatric/Behavioral: Positive for depression. Negative for hallucinations, memory loss, substance abuse and suicidal ideas. The patient is nervous/anxious. The patient does not have insomnia.     Blood pressure 126/79, pulse 77, temperature 98.5 F (36.9 C), temperature source Oral, resp. rate 18, height 5' 4.5" (1.638 m), weight 54.4 kg (120 lb), last menstrual period 10/24/2011, SpO2 95 %.Body mass index is 20.28 kg/m.  General Appearance: Casual  Eye Contact:  Good  Speech:  Clear and Coherent and Normal Rate  Volume:  Normal  Mood:  Anxious and Depressed  Affect:  Congruent and Depressed  Thought Process:  Coherent, Goal Directed and Linear  Orientation:  Full (Time, Place, and Person)  Thought Content:  Logical  Suicidal Thoughts:  No  Homicidal Thoughts:  No  Memory:  Immediate;  Good Recent;   Fair Remote;   Fair  Judgement:  Fair  Insight:  Fair  Psychomotor Activity:  Normal  Concentration:  Concentration: Good and Attention Span: Good  Recall:  Good  Fund of Knowledge:   Good  Language:  Good  Akathisia:  No  Handed:  Right  AIMS (if indicated):     Assets:  Museum/gallery curator Resilience Social Support Transportation Vocational/Educational  ADL's:  Intact  Cognition:  WNL  Sleep:        Treatment Plan Summary: Plan Anxiety  Discharge Home Follow up at  [facility] Avoid the use of alcohol and illicit drugs Take all medications only as prescribed  Disposition: No evidence of imminent risk to self or others at present.   Patient does not meet criteria for psychiatric inpatient admission. Supportive therapy provided about ongoing stressors. Discussed crisis plan, support from social network, calling 911, coming to the Emergency Department, and calling Suicide Hotline.  Ethelene Hal, NP 08/06/2017 10:59 AM  Patient seen face-to-face for psychiatric evaluation, chart reviewed and case discussed with the physician extender and developed treatment plan. Reviewed the information documented and agree with the treatment plan. Corena Pilgrim, MD

## 2017-08-06 NOTE — BHH Suicide Risk Assessment (Signed)
Suicide Risk Assessment  Discharge Assessment   [facility] Discharge Suicide Risk Assessment   Principal Problem: Anxiety Discharge Diagnoses:  Patient Active Problem List   Diagnosis Date Noted  . 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 Major depressive disorder, recurrent severe without psychotic features ([place]) [F33.2] 06/30/2017  . Hypotension [I95.9] 06/14/2017  . Hypokalemia [E87.6] 06/14/2017  . AKI (acute kidney injury) ([place]) [N17.9] 06/14/2017  . Normocytic anemia [D64.9] 06/14/2017  . Hyponatremia [E87.1] 06/14/2017  . Alcoholic intoxication without complication ([place]) [V61.607]   . Anxiety [F41.9]   . Hypothyroid [E03.9] 05/04/2017  . Paranoia ([place]) [F22]   . Generalized anxiety disorder [F41.1] 08/06/2016  . Panic disorder [F41.0] 08/06/2016  . Alcohol use disorder, severe, dependence ([place]) [F10.20] 06/25/2016  . Alcohol withdrawal ([place]) [F10.239] 06/25/2016  . Opioid use disorder, moderate, dependence ([place]) [F11.20] 06/25/2016  . Bipolar disorder, current episode mixed ([place]) [F31.60] 06/25/2016  . Bulimia nervosa [F50.2] 06/25/2016  . 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 GERD (gastroesophageal reflux disease) [K21.9] 01/12/2013  . Cancer of thyroid ([place]) [C73] 12/19/2012  . Malignant melanoma, metastatic ([place]) [C79.9] 12/10/2012  . Arthritis, degenerative [M19.90] 12/10/2012    Total Time spent with patient: 45 minutes  Musculoskeletal: Strength & Muscle Tone: within normal limits Gait & Station: normal Patient leans: N/A  Psychiatric Specialty Exam: Physical Exam  Constitutional: She is oriented to person, place, and time. She appears well-developed.  Respiratory: Effort normal.  Musculoskeletal: Normal range of motion.  Neurological: She is alert and oriented to person, place, and time.  Psychiatric: Her speech is normal and behavior is normal. Thought content normal. Her mood appears  anxious. Cognition and memory are normal. She expresses impulsivity.   Review of Systems  Psychiatric/Behavioral: Positive for depression. Negative for hallucinations, memory loss, substance abuse and suicidal ideas. The patient is nervous/anxious. The patient does not have insomnia.    Blood pressure 126/79, pulse 77, temperature 98.5 F (36.9 C), temperature source Oral, resp. rate 18, height 5' 4.5" (1.638 m), weight 54.4 kg (120 lb), last menstrual period 10/24/2011, SpO2 95 %.Body mass index is 20.28 kg/m. General Appearance: Casual Eye Contact:  Good Speech:  Clear and Coherent and Normal Rate Volume:  Normal Mood:  Anxious and Depressed Affect:  Congruent and Depressed Thought Process:  Coherent, Goal Directed and Linear Orientation:  Full (Time, Place, and Person) Thought Content:  Logical Suicidal Thoughts:  No Homicidal Thoughts:  No Memory:  Immediate;   Good Recent;   Fair Remote;   Fair Judgement:  Fair Insight:  Fair Psychomotor Activity:  Normal Concentration:  Concentration: Good and Attention Span: Good Recall:  Good Fund of Knowledge:  Good Language:  Good Akathisia:  No Handed:  Right AIMS (if indicated):    Assets:  Agricultural consultant Housing Resilience Social Support Transportation Vocational/Educational ADL's:  Intact Cognition:  WNL  Mental Status Per Nursing Assessment::   On Admission:   anxious  Demographic Factors:  Age 32 or older and Caucasian  Loss Factors: Loss of significant relationship and Financial problems/change in socioeconomic status  Historical Factors: Impulsivity  Risk Reduction Factors:   Sense of responsibility to family and Living with another person, especially a relative  Continued Clinical Symptoms:  Severe Anxiety and/or Agitation Depression:   Impulsivity Alcohol/Substance Abuse/Dependencies More than one psychiatric diagnosis  Cognitive Features That Contribute To Risk:   Closed-mindedness    Suicide Risk:  Minimal: No identifiable suicidal ideation.  Patients  presenting with no risk factors but with morbid ruminations; may be classified as minimal risk based on the severity of the depressive symptoms    Plan Of Care/Follow-up recommendations:  Activity:  as tolerated Diet:  Heart Healthy  Ethelene Hal, NP 08/06/2017, 11:14 AM

## 2017-08-06 NOTE — Progress Notes (Signed)
Clinical Social Worker has reviewed and discussed interventions used with Social Work Theatre manager.   Kingsley Spittle, [facility] Clinical Social Worker   Social work intern met with patient via bedside to discuss transportation services regarding her discharge. 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 Patient is prepared to discharge and provide her own transportation services.   Social work Theatre manager relayed this information to the nurse on duty.  Gean Quint, Social Work Intern  [phone]

## 2017-08-06 NOTE — BH Assessment (Signed)
[facility] Assessment Progress Note  Per Corena Pilgrim, MD, this pt does not require psychiatric hospitalization at this time.  Pt is to be discharged from [facility] with recommendation to follow up with the [facility].  This has been included in pt's discharge instructions.  Pt may also benefit from a social work consult; Education officer, museum will be asked to speak with pt.  Pt's nurse has been notified.  Jalene Mullet, [place] Triage Specialist [phone]

## 2017-08-06 NOTE — Telephone Encounter (Signed)
Message from Andria Meuse, RN CM requesting a hospital follow up appointment for the patient. An appointment was scheduled for tomorrow - 08/07/17 @ 1000.

## 2017-08-07 ENCOUNTER — Inpatient Hospital Stay: Payer: Self-pay

## 2017-08-10 ENCOUNTER — Emergency Department (HOSPITAL_COMMUNITY): Payer: BLUE CROSS/BLUE SHIELD

## 2017-08-10 ENCOUNTER — Encounter (HOSPITAL_COMMUNITY): Payer: Self-pay | Admitting: Emergency Medicine

## 2017-08-10 ENCOUNTER — Emergency Department (HOSPITAL_COMMUNITY)
Admission: EM | Admit: 2017-08-10 | Discharge: 2017-08-10 | Disposition: A | Discharge status: Home / Self Care | Payer: BLUE CROSS/BLUE SHIELD | Attending: Emergency Medicine | Admitting: Emergency Medicine

## 2017-08-10 DIAGNOSIS — Z85850 Personal history of malignant neoplasm of thyroid: Secondary | ICD-10-CM | POA: Insufficient documentation

## 2017-08-10 DIAGNOSIS — Z85820 Personal history of malignant melanoma of skin: Secondary | ICD-10-CM | POA: Insufficient documentation

## 2017-08-10 DIAGNOSIS — G894 Chronic pain syndrome: Secondary | ICD-10-CM | POA: Diagnosis not present

## 2017-08-10 DIAGNOSIS — F419 Anxiety disorder, unspecified: Secondary | ICD-10-CM | POA: Diagnosis present

## 2017-08-10 DIAGNOSIS — Z79899 Other long term (current) drug therapy: Secondary | ICD-10-CM | POA: Diagnosis not present

## 2017-08-10 DIAGNOSIS — F1010 Alcohol abuse, uncomplicated: Secondary | ICD-10-CM | POA: Diagnosis not present

## 2017-08-10 DIAGNOSIS — E876 Hypokalemia: Secondary | ICD-10-CM | POA: Diagnosis not present

## 2017-08-10 DIAGNOSIS — E890 Postprocedural hypothyroidism: Secondary | ICD-10-CM | POA: Insufficient documentation

## 2017-08-10 DIAGNOSIS — F17210 Nicotine dependence, cigarettes, uncomplicated: Secondary | ICD-10-CM | POA: Insufficient documentation

## 2017-08-10 LAB — COMPREHENSIVE METABOLIC PANEL
ALT: 16 U/L (ref 14–54)
ANION GAP: 14 (ref 5–15)
AST: 25 U/L (ref 15–41)
Albumin: 3.8 g/dL (ref 3.5–5.0)
Alkaline Phosphatase: 89 U/L (ref 38–126)
BILIRUBIN TOTAL: 0.5 mg/dL (ref 0.3–1.2)
BUN: 12 mg/dL (ref 6–20)
CHLORIDE: 100 mmol/L — AB (ref 101–111)
CO2: 21 mmol/L — AB (ref 22–32)
CREATININE: 0.61 mg/dL (ref 0.44–1.00)
Calcium: 8.6 mg/dL — ABNORMAL LOW (ref 8.9–10.3)
GFR calc Af Amer: >60 mL/min (ref >60)
GFR calc non Af Amer: >60 mL/min (ref >60)
GLUCOSE: 80 mg/dL (ref 65–99)
Potassium: 3.2 mmol/L — ABNORMAL LOW (ref 3.5–5.1)
Sodium: 135 mmol/L (ref 135–145)
Total Protein: 6.4 g/dL — ABNORMAL LOW (ref 6.5–8.1)

## 2017-08-10 LAB — CBC WITH DIFFERENTIAL/PLATELET
Basophils Absolute: 0 10*3/uL (ref 0.0–0.1)
Basophils Relative: 1 %
Eosinophils Absolute: 0.1 10*3/uL (ref 0.0–0.7)
Eosinophils Relative: 2 %
HEMATOCRIT: 35.8 % — AB (ref 36.0–46.0)
HEMOGLOBIN: 12.3 g/dL (ref 12.0–15.0)
LYMPHS ABS: 1.3 10*3/uL (ref 0.7–4.0)
LYMPHS PCT: 26 %
MCH: 32 pg (ref 26.0–34.0)
MCHC: 34.4 g/dL (ref 30.0–36.0)
MCV: 93.2 fL (ref 78.0–100.0)
MONOS PCT: 8 %
Monocytes Absolute: 0.4 10*3/uL (ref 0.1–1.0)
NEUTROS ABS: 3.2 10*3/uL (ref 1.7–7.7)
NEUTROS PCT: 63 %
Platelets: 234 10*3/uL (ref 150–400)
RBC: 3.84 MIL/uL — ABNORMAL LOW (ref 3.87–5.11)
RDW: 14.6 % (ref 11.5–15.5)
WBC: 4.9 10*3/uL (ref 4.0–10.5)

## 2017-08-10 LAB — LIPASE, BLOOD: Lipase: 22 U/L (ref 11–51)

## 2017-08-10 LAB — ETHANOL: ALCOHOL ETHYL (B): 88 mg/dL — AB (ref <5)

## 2017-08-10 MED ORDER — ACETAMINOPHEN 325 MG PO TABS
650.0000 mg | ORAL_TABLET | Freq: Once | ORAL | Status: AC
  Administered 2017-08-10: 650 mg via ORAL
  Filled 2017-08-10: qty 2

## 2017-08-10 MED ORDER — POTASSIUM CHLORIDE CRYS ER 20 MEQ PO TBCR: 20.0000 meq | EXTENDED_RELEASE_TABLET | Freq: Two times a day (BID) | ORAL | 0 refills | Status: DC

## 2017-08-10 MED ORDER — POTASSIUM CHLORIDE CRYS ER 20 MEQ PO TBCR
40.0000 meq | EXTENDED_RELEASE_TABLET | Freq: Once | ORAL | Status: AC
  Administered 2017-08-10: 40 meq via ORAL
  Filled 2017-08-10: qty 2

## 2017-08-10 NOTE — ED Notes (Addendum)
Pt. Pulled the B/P cuff off while getting vitals. Nurse aware.

## 2017-08-10 NOTE — ED Notes (Signed)
She has eaten breakfast and is in no distress. She has given me several phone numbers to call, none of which answer the phone.

## 2017-08-10 NOTE — ED Notes (Signed)
Pt yelling at staff and refusing to have vital signs taken. Attempting to make herself throw up.

## 2017-08-10 NOTE — ED Provider Notes (Signed)
Care transferred to me. Patient appears to have chronic anxiety, denies SI. Does not appear significantly intoxicated at this time. Will replete K. Labs otherwise unremarkable. Likely has chronic pain. D/c home, f/u with PCP. Return precautions.   Sherwood Gambler, MD 08/10/17 [id number]

## 2017-08-10 NOTE — ED Triage Notes (Signed)
Pt coming from home with complaints of "pain all over".

## 2017-08-10 NOTE — ED Notes (Signed)
She is awake, alert and in no distress. She is cotentious and unhappy; however, she is redirectable and is basically cooperative. I assure her, upon her enquiry, that we will be getting her some breakfast.

## 2017-08-10 NOTE — ED Provider Notes (Signed)
[facility] DEPT Provider Note   CSN: [id number] Arrival date & time: 08/10/17  0606     History   Chief Complaint Chief Complaint  Patient presents with  . generalized pain    HPI Susan Mccarthy is a 66 y.o. female.  HPI Patient is a 66 year old female who presents from home complaining of pain all over.  She states that she drinks alcohol to help with the pain.  She reports pain in her left lateral chest and left shoulder that is worse with movement.  She has long-standing history of alcohol abuse and anxiety.  She denies abdominal pain.  Denies nausea vomiting diarrhea.  No fevers or chills.   Past Medical History:  Diagnosis Date  . Anxiety   . Arthritis   . Depression   . ETOH abuse   . History of chemotherapy    INTERFERON  . Insomnia   . Melanoma ([place]) 1980   RIGHT BACK  . Thyroid ca ([place])   . Thyroid disease    85 % BENIGN    Patient Active Problem List   Diagnosis Date Noted  . 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 Major depressive disorder, recurrent severe without psychotic features ([place]) 06/30/2017  . Hypotension 06/14/2017  . Hypokalemia 06/14/2017  . AKI (acute kidney injury) ([place]) 06/14/2017  . Normocytic anemia 06/14/2017  . Hyponatremia 06/14/2017  . Alcoholic intoxication without complication ([place])   . Anxiety   . Hypothyroid 05/04/2017  . Paranoia ([place])   . Generalized anxiety disorder 08/06/2016  . Panic disorder 08/06/2016  . Alcohol use disorder, severe, dependence ([place]) 06/25/2016  . Alcohol withdrawal ([place]) 06/25/2016  . Opioid use disorder, moderate, dependence ([place]) 06/25/2016  . Bipolar disorder, current episode mixed ([place]) 06/25/2016  . Bulimia nervosa 06/25/2016  . Hypothyroidism, postop 07/08/2014  . GERD (gastroesophageal reflux disease) 01/12/2013  . Cancer of thyroid ([place]) 12/19/2012  . Malignant melanoma, metastatic ([place]) 12/10/2012  . Arthritis,  degenerative 12/10/2012    Past Surgical History:  Procedure Laterality Date  . AXILLARY NODE DISSECTION  08/02/11   right axillary,  . Excision of melanoma  1980  . PERCUTANEOUS PINNING Left 08/27/2013   Procedure: PERCUTANEOUS PINNING EXTREMITY;  Surgeon: Rozanna Box, MD;  Location: [facility];  Service: Orthopedics;  Laterality: Left;  . RASH     ON FACE AND HANDS S/P CHEMOTHERAPY  . SACRO-ILIAC PINNING Bilateral 08/27/2013   Procedure: Dub Mikes;  Surgeon: Rozanna Box, MD;  Location: [facility];  Service: Orthopedics;  Laterality: Bilateral;  Handy Bed, OIC Cannulated Screws  . THYROIDECTOMY  1/14   cancer    OB History    Gravida Para Term Preterm AB Living   0             SAB TAB Ectopic Multiple Live Births                  Obstetric Comments   MENARCHE TENNAGER 10, NO HRT, BIRTH CONTROL PILLS 2 YEARS       Home Medications    Prior to Admission medications   Medication Sig Start Date End Date Taking? Authorizing Provider  cholecalciferol (VITAMIN D) 1000 units tablet Take 1,000 Units by mouth daily.    [provider]  fexofenadine-pseudoephedrine (ALLEGRA-D 24) 180-240 MG 24 hr tablet Take 1 tablet by mouth daily.    [provider]  levothyroxine (SYNTHROID, LEVOTHROID) 100 MCG tablet Take 1 tablet (100 mcg total)  by mouth daily before breakfast. 07/31/17 08/30/17  Glyn Ade, PA-C  Multiple Vitamin (MULTIVITAMIN WITH MINERALS) TABS tablet Take 1 tablet by mouth daily. 06/19/17   Doreatha Lew, MD  thiamine (B-1) 100 MG/ML injection Inject 1 mL (100 mg total) into the vein daily. 06/19/17   Doreatha Lew, MD    Family History Family History  Problem Relation Age of Onset  . ALS Father     Social History Social History  Substance Use Topics  . Smoking status: Current Some Day Smoker    Packs/day: 0.25    Years: 5.00    Types: Cigarettes    Start date: 01/11/1974    Last attempt to quit: 06/07/1979  . Smokeless  tobacco: Never Used     Comment: quit 35 years  . Alcohol use 0.0 oz/week     Allergies   Other   Review of Systems Review of Systems  All other systems reviewed and are negative.    Physical Exam Updated Vital Signs BP 122/89   Pulse 74   LMP 10/24/2011   SpO2 97%   Physical Exam  Constitutional: She is oriented to person, place, and time. She appears well-developed and well-nourished. No distress.  HENT:  Head: Normocephalic and atraumatic.  Eyes: EOM are normal.  Neck: Normal range of motion.  Cardiovascular: Normal rate, regular rhythm and normal heart sounds.   Pulmonary/Chest: Effort normal and breath sounds normal.  Abdominal: Soft. She exhibits no distension. There is no tenderness.  Musculoskeletal: Normal range of motion.  Mild tenderness to palpation of the left lateral chest without bruising or crepitus.  Limited range of motion of the left shoulder secondary to pain.  Normal left radial pulse.  Normal grip strength left hand.  Full range of motion of major joints of lower extremities  Neurological: She is alert and oriented to person, place, and time.  Skin: Skin is warm and dry.  Psychiatric: She has a normal mood and affect. Judgment normal.  Nursing note and vitals reviewed.    ED Treatments / Results  Labs (all labs ordered are listed, but only abnormal results are displayed) Labs Reviewed  CBC WITH DIFFERENTIAL/PLATELET  COMPREHENSIVE METABOLIC PANEL  LIPASE, BLOOD  ETHANOL    EKG  EKG Interpretation None       Radiology Dg Chest 2 View  Result Date: 08/10/2017 CLINICAL DATA:  Left-sided chest pain after a fall. History of thyroid cancer and melanoma. Smoker. EXAM: CHEST  2 VIEW COMPARISON:  Chest 06/29/2017.  Chest CT 06/17/2017 FINDINGS: Shallow inspiration. Normal heart size and pulmonary vascularity. Linear scarring or atelectasis in the lung bases. Nodular opacities over both lungs correspond to old and healing rib fractures seen and  chest CT. No evidence of airspace disease or consolidation in the lungs. Mediastinal contours appear intact. Old fracture deformity of the distal right clavicle. Surgical clips in the right axilla. IMPRESSION: No evidence of active pulmonary disease. Multiple old rib fractures of different ages. Old fracture deformity of the distal right clavicle. Electronically Signed   By: Lucienne Capers M.D.   On: 08/10/2017 07:00   Dg Shoulder Left  Result Date: 08/10/2017 CLINICAL DATA:  Pain all over. History of thyroid cancer, melanoma, smoker. EXAM: LEFT SHOULDER - 2+ VIEW COMPARISON:  05/11/2017 FINDINGS: Degenerative changes in the glenohumeral and acromioclavicular joints. No evidence of acute fracture or dislocation of the left shoulder. Multiple rib fractures of different ages are demonstrated. Soft tissues are unremarkable. IMPRESSION: No acute bony abnormalities.  Degenerative changes in the left shoulder. Old and healing rib fractures. Electronically Signed   By: Lucienne Capers M.D.   On: 08/10/2017 07:01    Procedures Procedures (including critical care time)  Medications Ordered in ED Medications - No data to display   Initial Impression / Assessment and Plan / ED Course  I have reviewed the triage vital signs and the nursing notes.  Pertinent labs & imaging results that were available during my care of the patient were reviewed by me and considered in my medical decision making (see chart for details).     I suspect she is intoxicated.  Chest x-ray demonstrates old rib fractures.  Left shoulder film demonstrates no significant abnormality.  Labs and alcohol level pending.  Care to Dr Regenia Skeeter  Final Clinical Impressions(s) / ED Diagnoses   Final diagnoses:  None    New Prescriptions New Prescriptions   No medications on file     Jola Schmidt, MD 08/10/17 [phone]

## 2017-08-24 ENCOUNTER — Emergency Department (HOSPITAL_COMMUNITY)
Admission: EM | Admit: 2017-08-24 | Discharge: 2017-08-24 | Payer: BLUE CROSS/BLUE SHIELD | Attending: Emergency Medicine | Admitting: Emergency Medicine

## 2017-08-24 ENCOUNTER — Encounter (HOSPITAL_COMMUNITY): Payer: Self-pay | Admitting: Emergency Medicine

## 2017-08-24 DIAGNOSIS — Z85850 Personal history of malignant neoplasm of thyroid: Secondary | ICD-10-CM | POA: Insufficient documentation

## 2017-08-24 DIAGNOSIS — F419 Anxiety disorder, unspecified: Secondary | ICD-10-CM | POA: Diagnosis not present

## 2017-08-24 DIAGNOSIS — Z79899 Other long term (current) drug therapy: Secondary | ICD-10-CM | POA: Insufficient documentation

## 2017-08-24 DIAGNOSIS — F17210 Nicotine dependence, cigarettes, uncomplicated: Secondary | ICD-10-CM | POA: Insufficient documentation

## 2017-08-24 DIAGNOSIS — F329 Major depressive disorder, single episode, unspecified: Secondary | ICD-10-CM | POA: Insufficient documentation

## 2017-08-24 DIAGNOSIS — F1010 Alcohol abuse, uncomplicated: Secondary | ICD-10-CM | POA: Diagnosis not present

## 2017-08-24 DIAGNOSIS — E039 Hypothyroidism, unspecified: Secondary | ICD-10-CM | POA: Insufficient documentation

## 2017-08-24 DIAGNOSIS — Z85820 Personal history of malignant melanoma of skin: Secondary | ICD-10-CM | POA: Insufficient documentation

## 2017-08-24 LAB — BASIC METABOLIC PANEL
ANION GAP: 13 (ref 5–15)
BUN: 12 mg/dL (ref 6–20)
CO2: 24 mmol/L (ref 22–32)
Calcium: 9 mg/dL (ref 8.9–10.3)
Chloride: 99 mmol/L — ABNORMAL LOW (ref 101–111)
Creatinine, Ser: 0.83 mg/dL (ref 0.44–1.00)
GFR calc Af Amer: >60 mL/min (ref >60)
Glucose, Bld: 71 mg/dL (ref 65–99)
POTASSIUM: 3.5 mmol/L (ref 3.5–5.1)
SODIUM: 136 mmol/L (ref 135–145)

## 2017-08-24 NOTE — ED Notes (Signed)
Bed: WLPT4 Expected date:  Expected time:  Means of arrival:  Comments: 

## 2017-08-24 NOTE — ED Provider Notes (Signed)
[name] Provider Note   CSN: [id number] Arrival date & time: 08/24/17  1113     History   Chief Complaint Chief Complaint  Patient presents with  . Depression  . Anxiety    HPI Susan Mccarthy is a 66 y.o. female.She suffers from chronic anxiety and depressionHPI  she called police today because she became anxious as she could not reach her husband by telephone from whom she's been separated from for a prolonged period of time  . She made a vague remarked to the police officer that she wanted to kill herself but she now states "that was stupid" .she vehemently denies suicidal or homicidal ideation or wanting to harm herself. She does complain about feeling anxious over her broken up marriage, and finances. She admits to drinking too much alcohol, though denies any alcohol use today.  Past Medical History:  Diagnosis Date  . Anxiety   . Arthritis   . Depression   . ETOH abuse   . History of chemotherapy    INTERFERON  . Insomnia   . Melanoma ([place]) 1980   RIGHT BACK  . Thyroid ca ([place])   . Thyroid disease    85 % BENIGN    Patient Active Problem List   Diagnosis Date Noted  . 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 Major depressive disorder, recurrent severe without psychotic features ([place]) 06/30/2017  . Hypotension 06/14/2017  . Hypokalemia 06/14/2017  . AKI (acute kidney injury) ([place]) 06/14/2017  . Normocytic anemia 06/14/2017  . Hyponatremia 06/14/2017  . Alcoholic intoxication without complication ([place])   . Anxiety   . Hypothyroid 05/04/2017  . Paranoia ([place])   . Generalized anxiety disorder 08/06/2016  . Panic disorder 08/06/2016  . Alcohol use disorder, severe, dependence ([place]) 06/25/2016  . Alcohol withdrawal ([place]) 06/25/2016  . Opioid use disorder, moderate, dependence ([place]) 06/25/2016  . Bipolar disorder, current episode mixed ([place]) 06/25/2016  . Bulimia nervosa 06/25/2016  .  Hypothyroidism, postop 07/08/2014  . GERD (gastroesophageal reflux disease) 01/12/2013  . Cancer of thyroid ([place]) 12/19/2012  . Malignant melanoma, metastatic ([place]) 12/10/2012  . Arthritis, degenerative 12/10/2012    Past Surgical History:  Procedure Laterality Date  . AXILLARY NODE DISSECTION  08/02/11   right axillary,  . Excision of melanoma  1980  . PERCUTANEOUS PINNING Left 08/27/2013   Procedure: PERCUTANEOUS PINNING EXTREMITY;  Surgeon: Rozanna Box, MD;  Location: [facility];  Service: Orthopedics;  Laterality: Left;  . RASH     ON FACE AND HANDS S/P CHEMOTHERAPY  . SACRO-ILIAC PINNING Bilateral 08/27/2013   Procedure: Dub Mikes;  Surgeon: Rozanna Box, MD;  Location: [facility];  Service: Orthopedics;  Laterality: Bilateral;  Handy Bed, OIC Cannulated Screws  . THYROIDECTOMY  1/14   cancer    OB History    Gravida Para Term Preterm AB Living   0             SAB TAB Ectopic Multiple Live Births                  Obstetric Comments   MENARCHE TENNAGER 10, NO HRT, BIRTH CONTROL PILLS 1 YEARS       Home Medications    Prior to Admission medications   Medication Sig Start Date End Date Taking? Authorizing Provider  cholecalciferol (VITAMIN D) 1000 units tablet Take 1,000 Units by mouth daily.    [provider]  fexofenadine-pseudoephedrine (ALLEGRA-D 24) 180-240  MG 24 hr tablet Take 1 tablet by mouth daily.    [provider]  levothyroxine (SYNTHROID, LEVOTHROID) 100 MCG tablet Take 1 tablet (100 mcg total) by mouth daily before breakfast. 07/31/17 08/30/17  Glyn Ade, PA-C  Multiple Vitamin (MULTIVITAMIN WITH MINERALS) TABS tablet Take 1 tablet by mouth daily. 06/19/17   Doreatha Lew, MD  potassium chloride SA (K-DUR,KLOR-CON) 20 MEQ tablet Take 1 tablet (20 mEq total) by mouth 2 (two) times daily. 08/10/17   Sherwood Gambler, MD  thiamine (B-1) 100 MG/ML injection Inject 1 mL (100 mg total) into the vein daily. 06/19/17   Doreatha Lew, MD    Family History Family History  Problem Relation Age of Onset  . ALS Father     Social History Social History  Substance Use Topics  . Smoking status: Current Some Day Smoker    Packs/day: 0.25    Years: 5.00    Types: Cigarettes    Start date: 01/11/1974    Last attempt to quit: 06/07/1979  . Smokeless tobacco: Never Used     Comment: quit 35 years  . Alcohol use 0.0 oz/week     Allergies   Other   Review of Systems Review of Systems  Constitutional: Negative.   HENT: Negative.   Respiratory: Negative.   Cardiovascular: Negative.   Gastrointestinal: Negative.   Musculoskeletal: Negative.   Skin: Negative.   Neurological: Negative.   Psychiatric/Behavioral: Positive for dysphoric mood. The patient is nervous/anxious.   All other systems reviewed and are negative.    Physical Exam Updated Vital Signs BP (!) 144/94 (BP Location: Left Arm)   Pulse 76   Temp 98.3 F (36.8 C) (Oral)   Resp 18   LMP 10/24/2011   SpO2 95%   Physical Exam  Constitutional: She is oriented to person, place, and time. She appears well-developed and well-nourished. No distress.  HENT:  Head: Normocephalic and atraumatic.  Eyes: Pupils are equal, round, and reactive to light. Conjunctivae are normal.  Neck: Neck supple. No tracheal deviation present. No thyromegaly present.  Cardiovascular: Normal rate and regular rhythm.   No murmur heard. Pulmonary/Chest: Effort normal and breath sounds normal.  Abdominal: Soft. Bowel sounds are normal. She exhibits no distension. There is no tenderness.  Musculoskeletal: Normal range of motion. She exhibits no edema or tenderness.  Neurological: She is alert and oriented to person, place, and time. Coordination normal.  Gait normal  Skin: Skin is warm and dry. No rash noted.  Psychiatric: She has a normal mood and affect.  Nursing note and vitals reviewed.    ED Treatments / Results  Labs (all labs ordered are listed, but only  abnormal results are displayed) Labs Reviewed  I-STAT CHEM 8, ED    EKG  EKG Interpretation None       Radiology No results found.  Procedures Procedures (including critical care time)  Medications Ordered in ED Medications - No data to display   Initial Impression / Assessment and Plan / ED Course  I have reviewed the triage vital signs and the nursing notes.  Pertinent labs & imaging results that were available during my care of the patient were reviewed by me and considered in my medical decision making (see chart for details).     I don't feel that patient needs to be committed involuntarily for psychiatric evaluation. I don't feel the patient needs inpatient psychiatric stay. She vehemently denies point to harm herself or others. She is given outpatient  resources for substance abuse and for psychiatric help Results for orders placed or performed during the hospital encounter of 82/42/35  Basic metabolic panel  Result Value Ref Range   Sodium 136 135 - 145 mmol/L   Potassium 3.5 3.5 - 5.1 mmol/L   Chloride 99 (L) 101 - 111 mmol/L   CO2 24 22 - 32 mmol/L   Glucose, Bld 71 65 - 99 mg/dL   BUN 12 6 - 20 mg/dL   Creatinine, Ser 0.83 0.44 - 1.00 mg/dL   Calcium 9.0 8.9 - 10.3 mg/dL   GFR calc non Af Amer >60 >60 mL/min   GFR calc Af Amer >60 >60 mL/min   Anion gap 13 5 - 15   Dg Chest 2 View  Result Date: 08/10/2017 CLINICAL DATA:  Left-sided chest pain after a fall. History of thyroid cancer and melanoma. Smoker. EXAM: CHEST  2 VIEW COMPARISON:  Chest 06/29/2017.  Chest CT 06/17/2017 FINDINGS: Shallow inspiration. Normal heart size and pulmonary vascularity. Linear scarring or atelectasis in the lung bases. Nodular opacities over both lungs correspond to old and healing rib fractures seen and chest CT. No evidence of airspace disease or consolidation in the lungs. Mediastinal contours appear intact. Old fracture deformity of the distal right clavicle. Surgical clips in  the right axilla. IMPRESSION: No evidence of active pulmonary disease. Multiple old rib fractures of different ages. Old fracture deformity of the distal right clavicle. Electronically Signed   By: Lucienne Capers M.D.   On: 08/10/2017 07:00   Dg Shoulder Left  Result Date: 08/10/2017 CLINICAL DATA:  Pain all over. History of thyroid cancer, melanoma, smoker. EXAM: LEFT SHOULDER - 2+ VIEW COMPARISON:  05/11/2017 FINDINGS: Degenerative changes in the glenohumeral and acromioclavicular joints. No evidence of acute fracture or dislocation of the left shoulder. Multiple rib fractures of different ages are demonstrated. Soft tissues are unremarkable. IMPRESSION: No acute bony abnormalities. Degenerative changes in the left shoulder. Old and healing rib fractures. Electronically Signed   By: Lucienne Capers M.D.   On: 08/10/2017 07:01   Final Clinical Impressions(s) / ED Diagnoses  Diagnosis #1 chronic anxiety #2 chronic alcohol abuse Final diagnoses:  Anxiety    New Prescriptions New Prescriptions   No medications on file     Orlie Dakin, MD 08/24/17 1304

## 2017-08-24 NOTE — ED Triage Notes (Signed)
Patient BIB [facility], reports increased depression and anxiety. Hx of same. C/o pain all over. Denies S/HI at this time. States "I was just making stupid comments because I get overwhelmed."

## 2017-08-24 NOTE — Discharge Instructions (Signed)
Call any of the numbers listed to get help with your alcohol problem. They can also help with anxiety and depression. If you have any thought of harming yourself or someone else call 911 immediately

## 2017-08-25 ENCOUNTER — Ambulatory Visit (HOSPITAL_COMMUNITY)
Admission: RE | Admit: 2017-08-25 | Discharge: 2017-08-25 | Disposition: A | Discharge status: Home / Self Care | Payer: BLUE CROSS/BLUE SHIELD | Attending: Psychiatry | Admitting: Psychiatry

## 2017-08-25 ENCOUNTER — Emergency Department (HOSPITAL_COMMUNITY)
Admission: EM | Admit: 2017-08-25 | Discharge: 2017-08-25 | Disposition: A | Discharge status: Home / Self Care | Payer: BLUE CROSS/BLUE SHIELD | Attending: Emergency Medicine | Admitting: Emergency Medicine

## 2017-08-25 ENCOUNTER — Encounter (HOSPITAL_COMMUNITY): Payer: Self-pay | Admitting: Emergency Medicine

## 2017-08-25 DIAGNOSIS — R197 Diarrhea, unspecified: Secondary | ICD-10-CM | POA: Diagnosis present

## 2017-08-25 DIAGNOSIS — R1013 Epigastric pain: Secondary | ICD-10-CM | POA: Insufficient documentation

## 2017-08-25 DIAGNOSIS — F17210 Nicotine dependence, cigarettes, uncomplicated: Secondary | ICD-10-CM | POA: Diagnosis not present

## 2017-08-25 DIAGNOSIS — E039 Hypothyroidism, unspecified: Secondary | ICD-10-CM | POA: Insufficient documentation

## 2017-08-25 DIAGNOSIS — F419 Anxiety disorder, unspecified: Secondary | ICD-10-CM | POA: Insufficient documentation

## 2017-08-25 DIAGNOSIS — Z79899 Other long term (current) drug therapy: Secondary | ICD-10-CM | POA: Diagnosis not present

## 2017-08-25 DIAGNOSIS — Z85850 Personal history of malignant neoplasm of thyroid: Secondary | ICD-10-CM | POA: Diagnosis not present

## 2017-08-25 LAB — COMPREHENSIVE METABOLIC PANEL
ALT: 16 U/L (ref 14–54)
AST: 32 U/L (ref 15–41)
Albumin: 4.4 g/dL (ref 3.5–5.0)
Alkaline Phosphatase: 83 U/L (ref 38–126)
Anion gap: 14 (ref 5–15)
BUN: 12 mg/dL (ref 6–20)
CHLORIDE: 99 mmol/L — AB (ref 101–111)
CO2: 22 mmol/L (ref 22–32)
Calcium: 8.9 mg/dL (ref 8.9–10.3)
Creatinine, Ser: 0.85 mg/dL (ref 0.44–1.00)
Glucose, Bld: 65 mg/dL (ref 65–99)
POTASSIUM: 4.2 mmol/L (ref 3.5–5.1)
SODIUM: 135 mmol/L (ref 135–145)
Total Bilirubin: 1 mg/dL (ref 0.3–1.2)
Total Protein: 6.4 g/dL — ABNORMAL LOW (ref 6.5–8.1)

## 2017-08-25 LAB — CBC WITH DIFFERENTIAL/PLATELET
BASOS PCT: 1 %
Basophils Absolute: 0 10*3/uL (ref 0.0–0.1)
EOS ABS: 0.1 10*3/uL (ref 0.0–0.7)
Eosinophils Relative: 2 %
HCT: 36.8 % (ref 36.0–46.0)
HEMOGLOBIN: 12.3 g/dL (ref 12.0–15.0)
Lymphocytes Relative: 24 %
Lymphs Abs: 0.9 10*3/uL (ref 0.7–4.0)
MCH: 30.8 pg (ref 26.0–34.0)
MCHC: 33.4 g/dL (ref 30.0–36.0)
MCV: 92.2 fL (ref 78.0–100.0)
Monocytes Absolute: 0.5 10*3/uL (ref 0.1–1.0)
Monocytes Relative: 12 %
NEUTROS PCT: 61 %
Neutro Abs: 2.3 10*3/uL (ref 1.7–7.7)
Platelets: 160 10*3/uL (ref 150–400)
RBC: 3.99 MIL/uL (ref 3.87–5.11)
RDW: 14.4 % (ref 11.5–15.5)
WBC: 3.8 10*3/uL — AB (ref 4.0–10.5)

## 2017-08-25 LAB — ETHANOL

## 2017-08-25 LAB — LIPASE, BLOOD: LIPASE: 31 U/L (ref 11–51)

## 2017-08-25 MED ORDER — PROCHLORPERAZINE MALEATE 5 MG PO TABS
5.0000 mg | ORAL_TABLET | Freq: Once | ORAL | Status: AC
  Administered 2017-08-25: 5 mg via ORAL
  Filled 2017-08-25: qty 1

## 2017-08-25 MED ORDER — FAMOTIDINE 20 MG PO TABS
20.0000 mg | ORAL_TABLET | Freq: Once | ORAL | Status: AC
  Administered 2017-08-25: 20 mg via ORAL
  Filled 2017-08-25: qty 1

## 2017-08-25 MED ORDER — HYDROXYZINE HCL 25 MG PO TABS: 12.5000 mg | ORAL_TABLET | Freq: Four times a day (QID) | ORAL | 0 refills | Status: DC | PRN

## 2017-08-25 MED ORDER — LOPERAMIDE HCL 2 MG PO CAPS
4.0000 mg | ORAL_CAPSULE | Freq: Once | ORAL | Status: AC
  Administered 2017-08-25: 4 mg via ORAL
  Filled 2017-08-25: qty 2

## 2017-08-25 MED ORDER — DIPHENHYDRAMINE HCL 12.5 MG/5ML PO ELIX
12.5000 mg | ORAL_SOLUTION | Freq: Once | ORAL | Status: AC
  Administered 2017-08-25: 12.5 mg via ORAL
  Filled 2017-08-25: qty 10
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
## 2017-08-25 NOTE — ED Triage Notes (Signed)
Pt states she is having panic attacks since this morning, she has lots of overwhelming problems at home and she can't get her anxiety under control. Pt denies any SI at this time.

## 2017-08-25 NOTE — Discharge Instructions (Signed)
Go to [facility] ASAP  Please follow with your primary care doctor in the next 2 days for a check-up. They must obtain records for further management.   Return to the Emergency Department for any new, worsening or concerning symptoms.

## 2017-09-09 ENCOUNTER — Encounter (HOSPITAL_COMMUNITY): Payer: Self-pay | Admitting: *Deleted

## 2017-09-09 ENCOUNTER — Emergency Department (HOSPITAL_COMMUNITY)
Admission: EM | Admit: 2017-09-09 | Discharge: 2017-09-10 | Disposition: A | Discharge status: Home / Self Care | Payer: BLUE CROSS/BLUE SHIELD | Attending: Emergency Medicine | Admitting: Emergency Medicine

## 2017-09-09 DIAGNOSIS — F4323 Adjustment disorder with mixed anxiety and depressed mood: Secondary | ICD-10-CM | POA: Insufficient documentation

## 2017-09-09 DIAGNOSIS — F411 Generalized anxiety disorder: Secondary | ICD-10-CM | POA: Diagnosis present

## 2017-09-09 DIAGNOSIS — Z85820 Personal history of malignant melanoma of skin: Secondary | ICD-10-CM | POA: Insufficient documentation

## 2017-09-09 DIAGNOSIS — F332 Major depressive disorder, recurrent severe without psychotic features: Secondary | ICD-10-CM | POA: Insufficient documentation

## 2017-09-09 DIAGNOSIS — Z79899 Other long term (current) drug therapy: Secondary | ICD-10-CM | POA: Insufficient documentation

## 2017-09-09 DIAGNOSIS — E039 Hypothyroidism, unspecified: Secondary | ICD-10-CM | POA: Insufficient documentation

## 2017-09-09 DIAGNOSIS — Z85850 Personal history of malignant neoplasm of thyroid: Secondary | ICD-10-CM | POA: Insufficient documentation

## 2017-09-09 DIAGNOSIS — F17210 Nicotine dependence, cigarettes, uncomplicated: Secondary | ICD-10-CM | POA: Insufficient documentation

## 2017-09-09 LAB — ACETAMINOPHEN LEVEL: Acetaminophen (Tylenol), Serum: <10 ug/mL — ABNORMAL LOW (ref 10–30)

## 2017-09-09 LAB — COMPREHENSIVE METABOLIC PANEL
ALBUMIN: 3.9 g/dL (ref 3.5–5.0)
ALT: 28 U/L (ref 14–54)
AST: 53 U/L — AB (ref 15–41)
Alkaline Phosphatase: 83 U/L (ref 38–126)
Anion gap: 14 (ref 5–15)
BUN: 15 mg/dL (ref 6–20)
CHLORIDE: 91 mmol/L — AB (ref 101–111)
CO2: 21 mmol/L — AB (ref 22–32)
CREATININE: 0.99 mg/dL (ref 0.44–1.00)
Calcium: 8.2 mg/dL — ABNORMAL LOW (ref 8.9–10.3)
GFR calc Af Amer: >60 mL/min (ref >60)
GFR calc non Af Amer: 57 mL/min — ABNORMAL LOW (ref >60)
Glucose, Bld: 104 mg/dL — ABNORMAL HIGH (ref 65–99)
Potassium: 3.3 mmol/L — ABNORMAL LOW (ref 3.5–5.1)
SODIUM: 126 mmol/L — AB (ref 135–145)
Total Bilirubin: 1.1 mg/dL (ref 0.3–1.2)
Total Protein: 6.4 g/dL — ABNORMAL LOW (ref 6.5–8.1)

## 2017-09-09 LAB — CBC WITH DIFFERENTIAL/PLATELET
BASOS PCT: 1 %
Basophils Absolute: 0 10*3/uL (ref 0.0–0.1)
Eosinophils Absolute: 0.1 10*3/uL (ref 0.0–0.7)
Eosinophils Relative: 2 %
HEMATOCRIT: 35.6 % — AB (ref 36.0–46.0)
HEMOGLOBIN: 12.5 g/dL (ref 12.0–15.0)
LYMPHS ABS: 1.6 10*3/uL (ref 0.7–4.0)
Lymphocytes Relative: 28 %
MCH: 31 pg (ref 26.0–34.0)
MCHC: 35.1 g/dL (ref 30.0–36.0)
MCV: 88.3 fL (ref 78.0–100.0)
MONOS PCT: 11 %
Monocytes Absolute: 0.6 10*3/uL (ref 0.1–1.0)
NEUTROS ABS: 3.3 10*3/uL (ref 1.7–7.7)
NEUTROS PCT: 58 %
Platelets: 253 10*3/uL (ref 150–400)
RBC: 4.03 MIL/uL (ref 3.87–5.11)
RDW: 14.7 % (ref 11.5–15.5)
WBC: 5.7 10*3/uL (ref 4.0–10.5)

## 2017-09-09 LAB — SALICYLATE LEVEL

## 2017-09-09 LAB — ETHANOL: Alcohol, Ethyl (B): <10 mg/dL (ref <10)

## 2017-09-09 MED ORDER — PANTOPRAZOLE SODIUM 20 MG PO TBEC
20.0000 mg | DELAYED_RELEASE_TABLET | Freq: Every day | ORAL | Status: DC
  Administered 2017-09-09 – 2017-09-10 (×2): 20 mg via ORAL
  Filled 2017-09-09 (×2): qty 1
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
## 2017-09-09 NOTE — ED Triage Notes (Signed)
Patient states she is here d/t mother passing, depression, fall at home, pain everywhere, nausea.

## 2017-09-09 NOTE — ED Notes (Signed)
Pt stated "I'm just depressed.  I just feel exhausted.  I didn't get my meds filled because I didn't have the money.  I took them to [facility] this time, not [facility]."

## 2017-09-10 DIAGNOSIS — F411 Generalized anxiety disorder: Secondary | ICD-10-CM

## 2017-09-10 DIAGNOSIS — Z560 Unemployment, unspecified: Secondary | ICD-10-CM | POA: Diagnosis not present

## 2017-09-10 DIAGNOSIS — R451 Restlessness and agitation: Secondary | ICD-10-CM | POA: Diagnosis not present

## 2017-09-10 DIAGNOSIS — R4587 Impulsiveness: Secondary | ICD-10-CM | POA: Diagnosis not present

## 2017-09-10 DIAGNOSIS — F1024 Alcohol dependence with alcohol-induced mood disorder: Secondary | ICD-10-CM

## 2017-09-10 LAB — RAPID URINE DRUG SCREEN, HOSP PERFORMED
Amphetamines: NOT DETECTED
BARBITURATES: NOT DETECTED
Benzodiazepines: NOT DETECTED
COCAINE: NOT DETECTED
Opiates: NOT DETECTED
Tetrahydrocannabinol: NOT DETECTED

## 2017-09-10 MED ORDER — POTASSIUM CHLORIDE CRYS ER 20 MEQ PO TBCR: 20.0000 meq | EXTENDED_RELEASE_TABLET | Freq: Two times a day (BID) | ORAL | Status: DC

## 2017-09-10 MED ORDER — POTASSIUM CHLORIDE CRYS ER 20 MEQ PO TBCR
40.0000 meq | EXTENDED_RELEASE_TABLET | Freq: Once | ORAL | Status: AC
  Administered 2017-09-10: 40 meq via ORAL
  Filled 2017-09-10: qty 2

## 2017-09-10 MED ORDER — ACETAMINOPHEN 325 MG PO TABS
650.0000 mg | ORAL_TABLET | ORAL | Status: DC | PRN
  Administered 2017-09-10: 650 mg via ORAL
  Filled 2017-09-10: qty 2

## 2017-09-10 MED ORDER — HYDROXYZINE HCL 25 MG PO TABS: 12.5000 mg | ORAL_TABLET | Freq: Four times a day (QID) | ORAL | Status: DC | PRN

## 2017-09-10 MED ORDER — LEVOTHYROXINE SODIUM 100 MCG PO TABS
100.0000 ug | ORAL_TABLET | Freq: Every day | ORAL | Status: DC
  Administered 2017-09-10: 100 ug via ORAL
  Filled 2017-09-10 (×2): qty 1

## 2017-09-10 MED ORDER — GABAPENTIN 100 MG PO CAPS
100.0000 mg | ORAL_CAPSULE | Freq: Two times a day (BID) | ORAL | Status: DC
Start: 2017-09-10 — End: 2017-09-10

## 2017-09-10 MED ORDER — ADULT MULTIVITAMIN W/MINERALS CH
1.0000 | ORAL_TABLET | Freq: Every day | ORAL | Status: DC
  Administered 2017-09-10: 1 via ORAL
  Filled 2017-09-10: qty 1

## 2017-09-10 MED ORDER — POTASSIUM CHLORIDE CRYS ER 20 MEQ PO TBCR
20.0000 meq | EXTENDED_RELEASE_TABLET | Freq: Two times a day (BID) | ORAL | Status: DC
Start: 2017-09-10 — End: 2017-09-10
  Administered 2017-09-10: 20 meq via ORAL
  Filled 2017-09-10: qty 1

## 2017-09-10 NOTE — Discharge Instructions (Signed)
For your behavioral health needs, you are advised to follow up with the [facility].  Contact them at your earliest opportunity to ask about scheduling an intake appointment:       [facility]      [address] [place], [place] [address]      [phone]
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
## 2017-09-10 NOTE — Patient Outreach (Signed)
ED Peer Support Specialist Patient Intake (Complete at intake & 30-60 Day Follow-up)  Name: Susan Mccarthy  MRN: [id number]  Age: 66 y.o.   Date of Admission: 09/10/2017  Intake: 30 Day Comments:      Primary Reason Admitted:Patient stated that she felt that she needed an assessment completed due to feeling "overwhelmed" and missing her mother to died in 03-07-17.  Patient reported feeling anxious and stated that she felt restless, fatigued, irritable, and had problems sleeping.  Patient stated that she has experienced an ongoing use of alcohol, after 22 year period of sobriety.  Per medical records (09/09/2017), Patient ethanol levels were <10 mg/dL.  Patient denies suicidal ideations, homicidal ideations, auditory/visual hallucinations, self-injurious behaviors. Patient stated that she had access to knives, however reported no intentions to utilize to harm others.  Patient reported ongoing experiences with depressive symptoms, such as despondency, fatigue, insomnia, tearfulness, feelings of worthlessness, guilt, irritability, loss of interest in previously enjoyable activities.  Patient reported currently residing with her friends and being separated from her husband. Patient identified recent stressors associated with the death of her mother.  Patient reported having an upcoming court date for misuse of the emergency services system.   Lab values: Alcohol/ETOH: Positive Positive UDS? No Amphetamines: No Barbiturates: No Benzodiazepines: No Cocaine: No Opiates: No Cannabinoids: No  Demographic information: Gender: Female Ethnicity: White Marital Status: Separated Insurance Status: Other (comment) (blue cross blue shield ) Ecologist (Work Neurosurgeon, Physicist, medical, etc.:   Lives with: Alone Living situation: House/Apartment  Reported Patient History: Patient reported health conditions: Depression Patient aware of HIV and hepatitis status:  No  In past year, has patient visited ED for any reason? Yes  Number of ED visits: 3  Reason(s) for visit: dependenat on others and became depressed.  In past year, has patient been hospitalized for any reason? No  Number of hospitalizations:    Reason(s) for hospitalization:    In past year, has patient been arrested? Yes  Number of arrests: 1  Reason(s) for arrest: Calling 911 to many times.   In past year, has patient been incarcerated? No  Number of incarcerations:    Reason(s) for incarceration:    In past year, has patient received medication-assisted treatment? No  In past year, patient received the following treatments: Group therapy (Recovery group)  In past year, has patient received any harm reduction services? No  Did this include any of the following?    In past year, has patient received care from a mental health provider for diagnosis other than SUD? No (Has been trying )  In past year, is this first time patient has overdosed? No  Number of past overdoses: 0  In past year, is this first time patient has been hospitalized for an overdose? No  Number of hospitalizations for overdose(s): 0  Is patient currently receiving treatment for a mental health diagnosis? No  Patient reports experiencing difficulty participating in SUD treatment: No    Most important reason(s) for this difficulty?    Has patient received prior services for treatment? No  In past, patient has received services from following agencies:    Plan of Care:  Suggested follow up at these agencies/treatment centers: IOP (Intensive Outpatient Program for [facility]), SAIOP (Substance Abuse Intensive Outpatient Program)  Other information:CPSS Aaron Edelman and Jenny Reichmann talked with Pt and processed with her about what services are available for here, and encouraged her to continue to attend the groups. CPSS Aaron Edelman and Jenny Reichmann  made Pt aware that CPSS's position is to help her better the quality of her life  by assisting with finding treatment.    Mason Jim, CPSS  09/10/2017 11:40 AM

## 2017-09-10 NOTE — BHH Counselor (Signed)
Per Patriciaann Clan, PA-C: Continue observation for safety and stabilization.  Deferred disposition, based on medical history, to Psychiatrist in AM to discuss medication management options.   Baird Lyons, MD, notified of disposition [phone].

## 2017-09-10 NOTE — ED Notes (Signed)
TTS equipment to room.  TTS assessment in progress.

## 2017-09-10 NOTE — ED Notes (Signed)
Pt stated "I need to call my attorney.  I have an 0800 appointment this morning because of all the 911 calls I've made."

## 2017-09-10 NOTE — BHH Suicide Risk Assessment (Signed)
Suicide Risk Assessment  Discharge Assessment   [facility] Discharge Suicide Risk Assessment   Principal Problem: Generalized anxiety disorder Discharge Diagnoses:  Patient Active Problem List   Diagnosis Date Noted  . 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 Major depressive disorder, recurrent severe without psychotic features ([place]) [F33.2] 06/30/2017  . Hypotension [I95.9] 06/14/2017  . Hypokalemia [E87.6] 06/14/2017  . AKI (acute kidney injury) ([place]) [N17.9] 06/14/2017  . Normocytic anemia [D64.9] 06/14/2017  . Hyponatremia [E87.1] 06/14/2017  . Alcoholic intoxication without complication ([place]) [I45.809]   . Anxiety [F41.9]   . Hypothyroid [E03.9] 05/04/2017  . Paranoia ([place]) [F22]   . Generalized anxiety disorder [F41.1] 08/06/2016  . Panic disorder [F41.0] 08/06/2016  . Alcohol use disorder, severe, dependence ([place]) [F10.20] 06/25/2016  . Alcohol withdrawal ([place]) [F10.239] 06/25/2016  . Opioid use disorder, moderate, dependence ([place]) [F11.20] 06/25/2016  . Bipolar disorder, current episode mixed ([place]) [F31.60] 06/25/2016  . Bulimia nervosa [F50.2] 06/25/2016  . 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 GERD (gastroesophageal reflux disease) [K21.9] 01/12/2013  . Cancer of thyroid ([place]) [C73] 12/19/2012  . Malignant melanoma, metastatic ([place]) [C79.9] 12/10/2012  . Arthritis, degenerative [M19.90] 12/10/2012    Total Time spent with patient: 45 minutes  Musculoskeletal: Strength & Muscle Tone: within normal limits Gait & Station: normal Patient leans: N/A  Psychiatric Specialty Exam:   Blood pressure (!) 142/84, pulse 66, temperature 98.1 F (36.7 C), temperature source Oral, resp. rate 16, height 5\' 4"  (1.626 m), weight 54.4 kg (120 lb), last menstrual period 10/24/2011, SpO2 97 %.Body mass index is 20.6 kg/m.  General Appearance: Disheveled  Eye Contact::  Good  Speech:  Clear and Coherent and  Normal Rate409  Volume:  Normal  Mood:  Depressed  Affect:  Congruent and Depressed  Thought Process:  Coherent and Linear  Orientation:  Full (Time, Place, and Person)  Thought Content:  Logical  Suicidal Thoughts:  No  Homicidal Thoughts:  No  Memory:  Immediate;   Good Recent;   Fair Remote;   Fair  Judgement:  Poor  Insight:  Fair  Psychomotor Activity:  Decreased  Concentration:  Good  Recall:  Good  Fund of Knowledge:Good  Language: Good  Akathisia:  No  Handed:  Right  AIMS (if indicated):     Assets:  Agricultural consultant Housing Resilience Social Support  Sleep:     Cognition: WNL  ADL's:  Intact   Mental Status Per Nursing Assessment::   On Admission:     Demographic Factors:  Caucasian, Living alone and Unemployed  Loss Factors: Decline in physical health  Historical Factors: Impulsivity  Risk Reduction Factors:   Sense of responsibility to family  Continued Clinical Symptoms:  Severe Anxiety and/or Agitation Depression:   Impulsivity Alcohol/Substance Abuse/Dependencies More than one psychiatric diagnosis Unstable or Poor Therapeutic Relationship  Cognitive Features That Contribute To Risk:  Closed-mindedness and Thought constriction (tunnel vision)    Suicide Risk:  Minimal: No identifiable suicidal ideation.  Patients presenting with no risk factors but with morbid ruminations; may be classified as minimal risk based on the severity of the depressive symptoms    Plan Of Care/Follow-up recommendations:  Activity:  as tolerated Diet:  Heart Healthy  Ethelene Hal, NP 09/10/2017, 11:36 AM

## 2017-09-10 NOTE — ED Provider Notes (Signed)
[facility] DEPT Provider Note   CSN: [id number] Arrival date & time: 09/09/17  1715     History   Chief Complaint Chief Complaint  Patient presents with  . Fall  . Depression    HPI Susan Mccarthy is a 66 y.o. female.  HPI Patient for she has been becoming increasingly depressed. She reports that she is thinking all the time about her mother's death. She reports it makes her very sad and doesn't feel like she can even live or take care of herself anymore. Patient reports her depression is making it impossible for her to do anything. She states she hasn't wanted to eat and feels generally miserable all the time. She reports that she fell a few days ago on her knees and she hurts in her knees and her ankles and her feet. She reports that she is adding to the symptoms. Past Medical History:  Diagnosis Date  . Anxiety   . Arthritis   . Depression   . ETOH abuse   . History of chemotherapy    INTERFERON  . Insomnia   . Melanoma ([place]) 1980   RIGHT BACK  . Thyroid ca ([place])   . Thyroid disease    85 % BENIGN    Patient Active Problem List   Diagnosis Date Noted  . 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 Major depressive disorder, recurrent severe without psychotic features ([place]) 06/30/2017  . Hypotension 06/14/2017  . Hypokalemia 06/14/2017  . AKI (acute kidney injury) ([place]) 06/14/2017  . Normocytic anemia 06/14/2017  . Hyponatremia 06/14/2017  . Alcoholic intoxication without complication ([place])   . Anxiety   . Hypothyroid 05/04/2017  . Paranoia ([place])   . Generalized anxiety disorder 08/06/2016  . Panic disorder 08/06/2016  . Alcohol use disorder, severe, dependence ([place]) 06/25/2016  . Alcohol withdrawal ([place]) 06/25/2016  . Opioid use disorder, moderate, dependence ([place]) 06/25/2016  . Bipolar disorder, current episode mixed ([place]) 06/25/2016  . Bulimia nervosa 06/25/2016  . Hypothyroidism, postop  07/08/2014  . GERD (gastroesophageal reflux disease) 01/12/2013  . Cancer of thyroid ([place]) 12/19/2012  . Malignant melanoma, metastatic ([place]) 12/10/2012  . Arthritis, degenerative 12/10/2012    Past Surgical History:  Procedure Laterality Date  . AXILLARY NODE DISSECTION  08/02/11   right axillary,  . Excision of melanoma  1980  . PERCUTANEOUS PINNING Left 08/27/2013   Procedure: PERCUTANEOUS PINNING EXTREMITY;  Surgeon: Rozanna Box, MD;  Location: [facility];  Service: Orthopedics;  Laterality: Left;  . RASH     ON FACE AND HANDS S/P CHEMOTHERAPY  . SACRO-ILIAC PINNING Bilateral 08/27/2013   Procedure: [place];  Surgeon: Rozanna Box, MD;  Location: [facility];  Service: Orthopedics;  Laterality: Bilateral;  Handy Bed, OIC Cannulated Screws  . THYROIDECTOMY  1/14   cancer    OB History    Gravida Para Term Preterm AB Living   0             SAB TAB Ectopic Multiple Live Births                  Obstetric Comments   MENARCHE TENNAGER 10, NO HRT, BIRTH CONTROL PILLS 1 YEARS       Home Medications    Prior to Admission medications   Medication Sig Start Date End Date Taking? Authorizing Provider  cholecalciferol (VITAMIN D) 1000 units tablet Take 1,000 Units by mouth daily.   Yes [provider]  hydrOXYzine (ATARAX/VISTARIL) 25 MG tablet Take 0.5 tablets (12.5 mg total) by mouth every 6 (six) hours as needed for anxiety. 08/25/17   Pisciotta, Elmyra Ricks, PA-C  levothyroxine (SYNTHROID, LEVOTHROID) 100 MCG tablet Take 1 tablet (100 mcg total) by mouth daily before breakfast. Patient not taking: Reported on 08/25/2017 07/31/17 08/30/17  Glyn Ade, PA-C  Multiple Vitamin (MULTIVITAMIN WITH MINERALS) TABS tablet Take 1 tablet by mouth daily. Patient not taking: Reported on 08/25/2017 06/19/17   Patrecia Pour, Christean Grief, MD  potassium chloride SA (K-DUR,KLOR-CON) 20 MEQ tablet Take 1 tablet (20 mEq total) by mouth 2 (two) times daily. Patient not taking: Reported on  08/25/2017 08/10/17   [place] Gambler, MD  thiamine (B-1) 100 MG/ML injection Inject 1 mL (100 mg total) into the vein daily. Patient not taking: Reported on 08/25/2017 06/19/17   Doreatha Lew, MD    Family History Family History  Problem Relation Age of Onset  . ALS Father     Social History Social History  Substance Use Topics  . Smoking status: Current Some Day Smoker    Packs/day: 0.25    Years: 5.00    Types: Cigarettes    Start date: 01/11/1974    Last attempt to quit: 06/07/1979  . Smokeless tobacco: Never Used     Comment: quit 35 years  . Alcohol use 0.0 oz/week     Allergies   Other; Dust mite extract; and Grass extracts [gramineae pollens]   Review of Systems Review of Systems 10 Systems reviewed and are negative for acute change except as noted in the HPI.   Physical Exam Updated Vital Signs BP (!) 132/93 (BP Location: Left Arm)   Pulse 76   Temp 98 F (36.7 C) (Oral)   Resp 14   Ht 5\' 4"  (1.626 m)   Wt 54.4 kg (120 lb)   LMP 10/24/2011   SpO2 98%   BMI 20.60 kg/m   Physical Exam  Constitutional: She is oriented to person, place, and time. She appears well-developed and well-nourished. No distress.  Patient is alert and nontoxic. She is intermittently tearful. No respiratory distress.  HENT:  Head: Normocephalic and atraumatic.  Nose: Nose normal.  Mouth/Throat: Oropharynx is clear and moist.  Eyes: Conjunctivae and EOM are normal.  Neck: Neck supple.  Cardiovascular: Normal rate, regular rhythm, normal heart sounds and intact distal pulses.   No murmur heard. Pulmonary/Chest: Effort normal and breath sounds normal. No respiratory distress.  Abdominal: Soft. She exhibits no distension. There is no tenderness. There is no guarding.  Musculoskeletal: Normal range of motion. She exhibits tenderness. She exhibits no edema.  Patient has older bruises on her knees. She has no associated effusion or erythema. No hematomas. Normal range of motion of  the knees ankles and feet. No peripheral edema. Calves are soft nontender.  Neurological: She is alert and oriented to person, place, and time. No cranial nerve deficit. She exhibits normal muscle tone. Coordination normal.  Skin: Skin is warm and dry.  Psychiatric:  Patient is intermittently tearful. She is cooperative in providing full history.  Nursing note and vitals reviewed.    ED Treatments / Results  Labs (all labs ordered are listed, but only abnormal results are displayed) Labs Reviewed  COMPREHENSIVE METABOLIC PANEL - Abnormal; Notable for the following:       Result Value   Sodium 126 (*)    Potassium 3.3 (*)    Chloride 91 (*)    CO2 21 (*)    Glucose,  Bld 104 (*)    Calcium 8.2 (*)    Total Protein 6.4 (*)    AST 53 (*)    GFR calc non Af Amer 57 (*)    All other components within normal limits  ACETAMINOPHEN LEVEL - Abnormal; Notable for the following:    Acetaminophen (Tylenol), Serum <10 (*)    All other components within normal limits  CBC WITH DIFFERENTIAL/PLATELET - Abnormal; Notable for the following:    HCT 35.6 (*)    All other components within normal limits  ETHANOL  SALICYLATE LEVEL  RAPID URINE DRUG SCREEN, HOSP PERFORMED  CBC WITH DIFFERENTIAL/PLATELET    EKG  EKG Interpretation None       Radiology No results found.  Procedures Procedures (including critical care time)  Medications Ordered in ED Medications  pantoprazole (PROTONIX) EC tablet 20 mg (20 mg Oral Given 09/09/17 2055)  potassium chloride SA (K-DUR,KLOR-CON) CR tablet 40 mEq (not administered)     Initial Impression / Assessment and Plan / ED Course  I have reviewed the triage vital signs and the nursing notes.  Pertinent labs & imaging results that were available during my care of the patient were reviewed by me and considered in my medical decision making (see chart for details).     Final Clinical Impressions(s) / ED Diagnoses   Final diagnoses:  Severe  episode of recurrent major depressive disorder, without psychotic features ([place])  Adjustment disorder with mixed anxiety and depressed mood  Patient with increased depression with reported fixation on her mother's death which occurred in the spring. Patient reports her eating and sleeping patterns are disrupted. She reports not caring for herself in her home due to depression and anxiety. Patient does not have suicidal plan but expresses thoughts of severe hopelessness and lack of desire to live. Patient is clinically nontoxic and alert without apparent acute medical illness. Patient is medically cleared for TTS consultation.  New Prescriptions New Prescriptions   No medications on file     Charlesetta Shanks, MD 09/10/17 [phone]

## 2017-09-10 NOTE — ED Notes (Signed)
Pt c/o left foot pain stating "I broke the metatarsals once and this is what the cold does."

## 2017-09-10 NOTE — BH Assessment (Signed)
[facility] Assessment Progress Note  Per Corena Pilgrim, MD, this pt does not require psychiatric hospitalization at this time.  Pt is to be discharged from [facility] with recommendation to follow up with the [facility].  This has been included in pt's discharge instructions.  Pt would also benefit from seeing Peer Support Specialists; they will be asked to speak to pt.  Pt's nurse, Lala Lund, has been notified.  Jalene Mullet, [place] Triage Specialist [phone]

## 2017-09-15 IMAGING — CR DG CHEST 2V
2 series · 2 of 2 positions shown · non-contrast
Comparison: Chest 06/29/2017.  Chest CT 06/17/2017

CLINICAL DATA: Left-sided chest pain after a fall. History of
thyroid cancer and melanoma. Smoker.

EXAM:
CHEST  2 VIEW

[w chest lat]
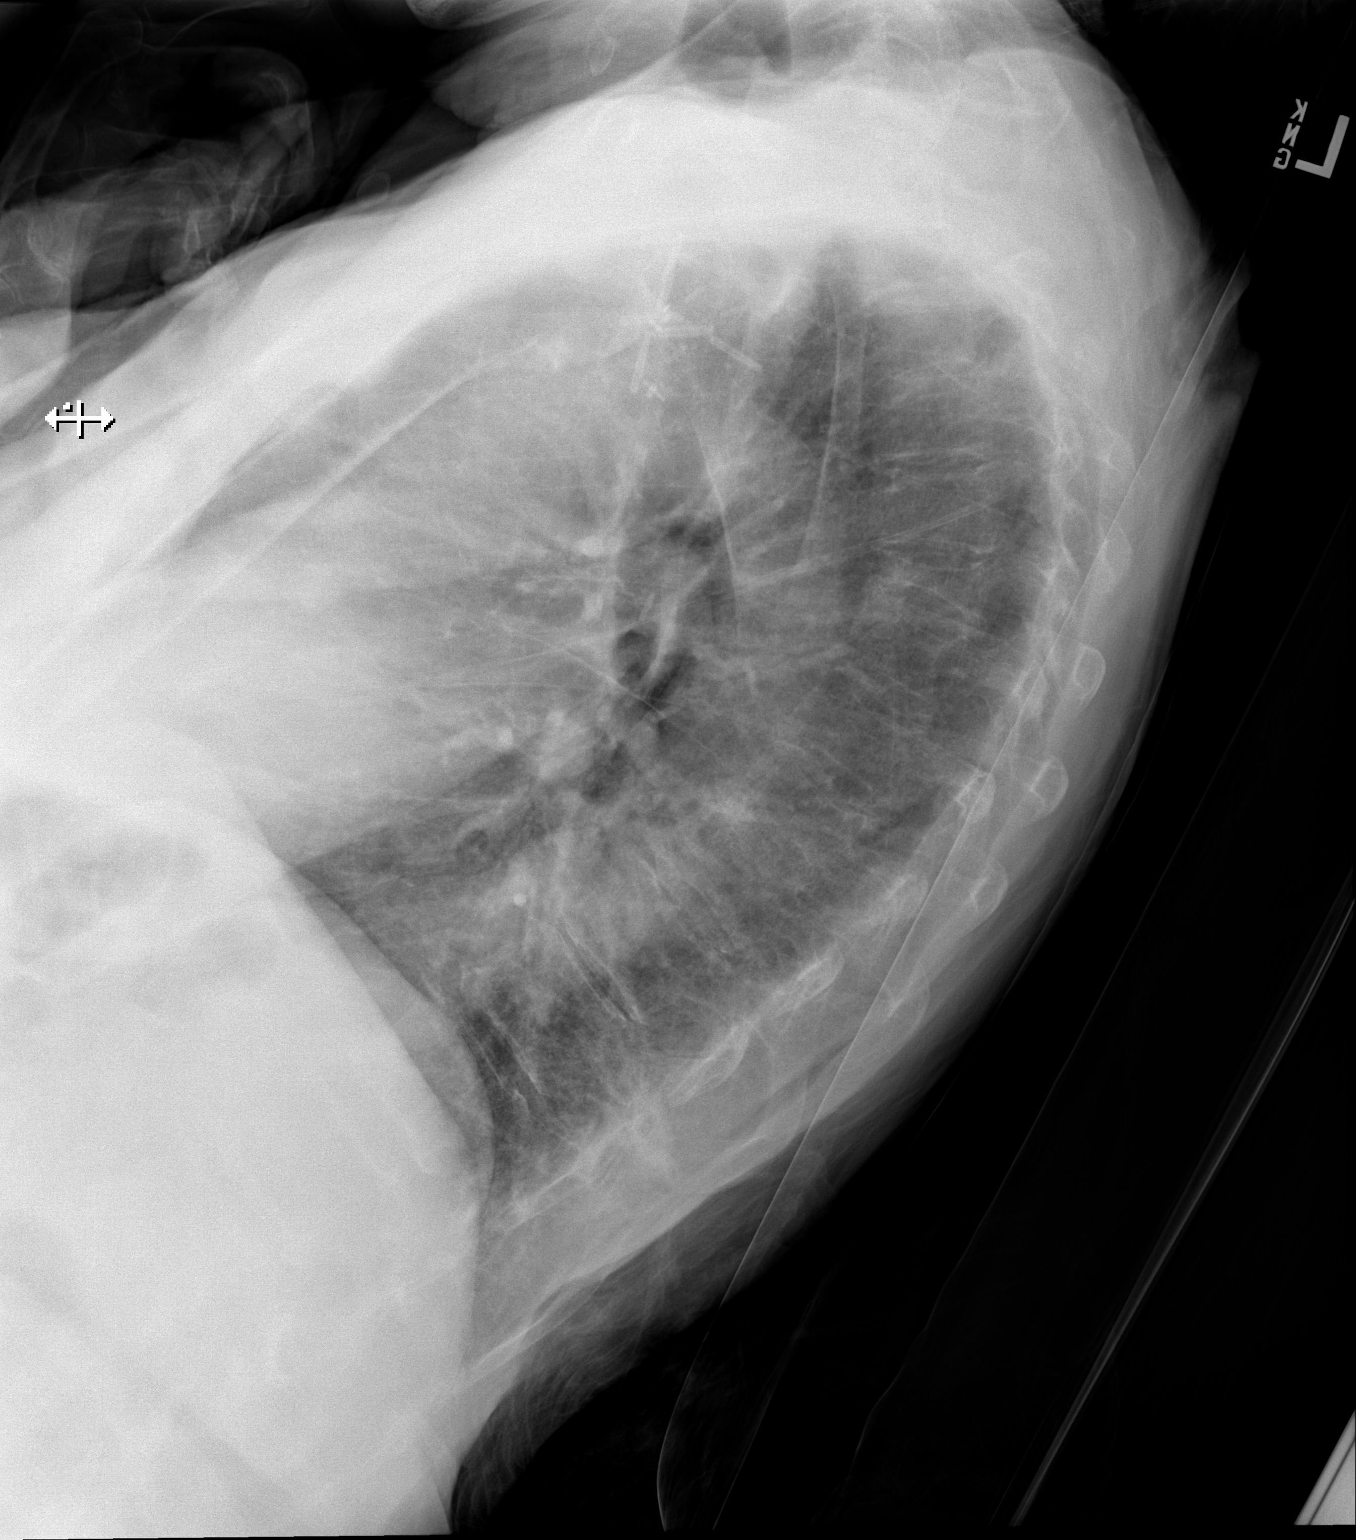

[x chest ap]
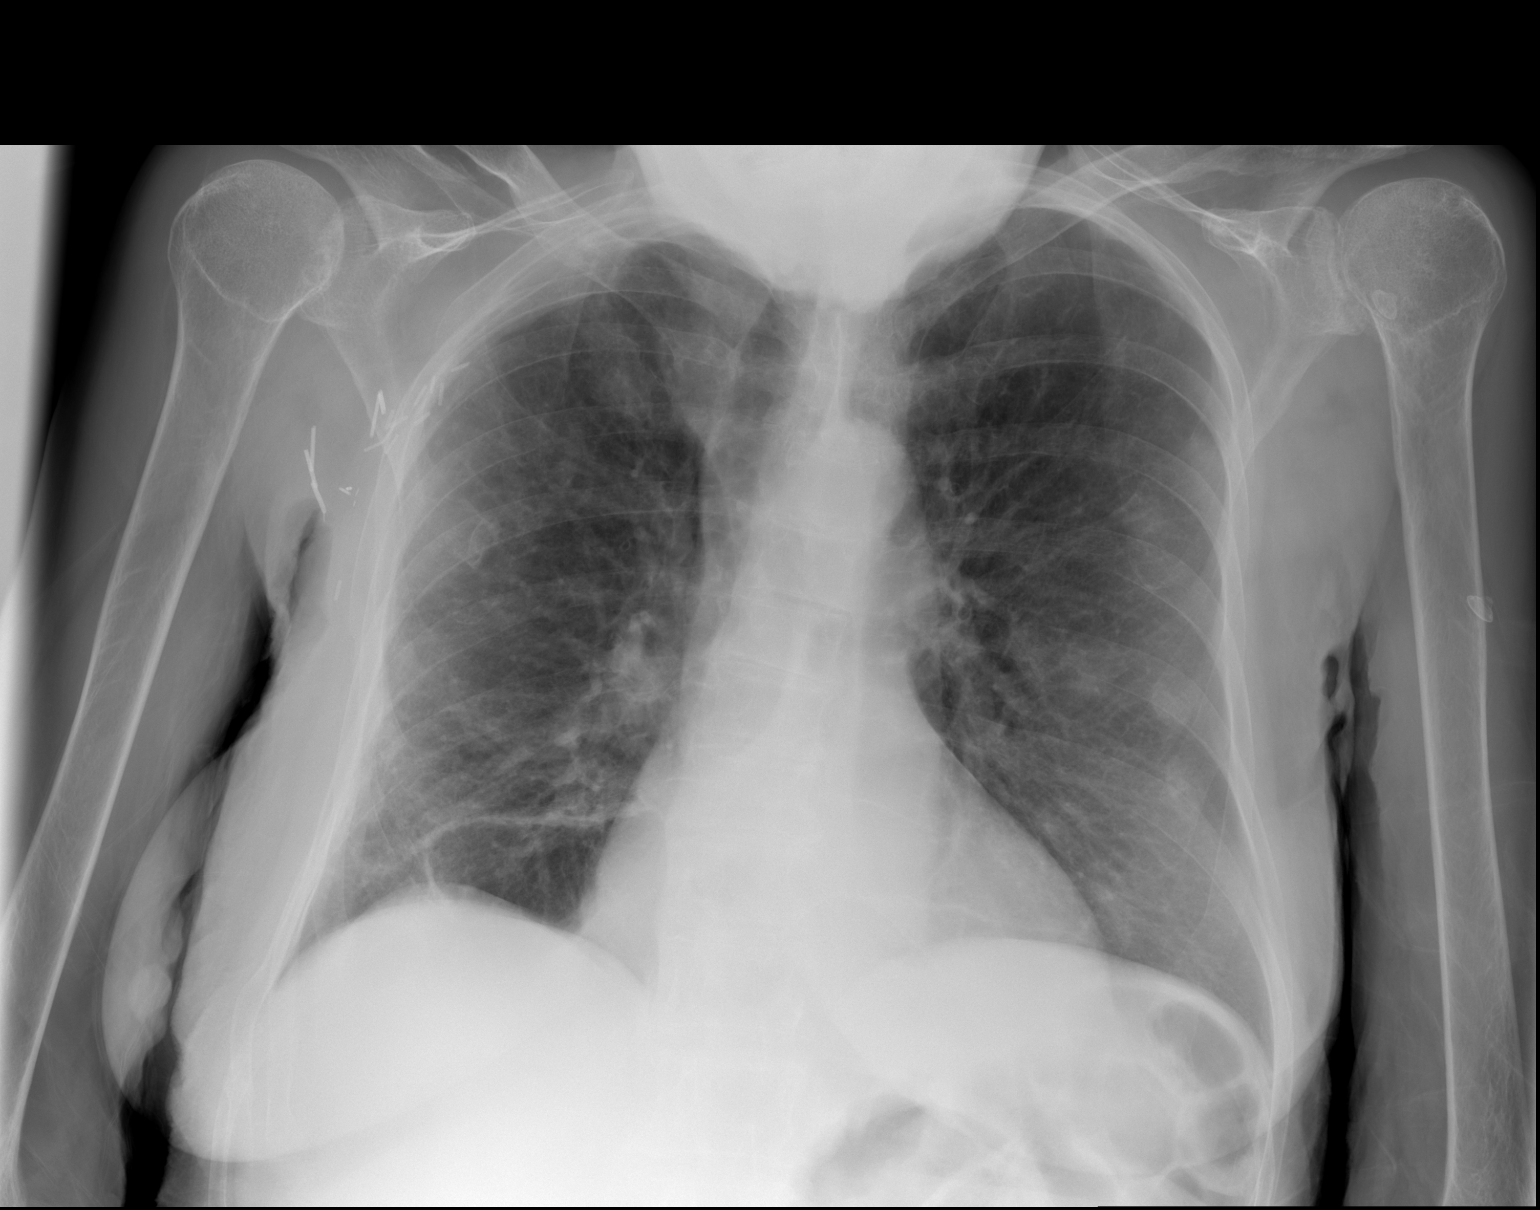

[2 of 2 positions shown; findings below may reference images not displayed]

FINDINGS: Shallow inspiration. Normal heart size and pulmonary vascularity.
Linear scarring or atelectasis in the lung bases. Nodular opacities
over both lungs correspond to old and healing rib fractures seen and
chest CT. No evidence of airspace disease or consolidation in the
lungs. Mediastinal contours appear intact. Old fracture deformity of
the distal right clavicle. Surgical clips in the right axilla.
IMPRESSION: No evidence of active pulmonary disease. Multiple old rib fractures
of different ages. Old fracture deformity of the distal right
clavicle.

## 2017-10-09 ENCOUNTER — Encounter (HOSPITAL_COMMUNITY): Payer: Self-pay | Admitting: Internal Medicine

## 2017-10-09 ENCOUNTER — Emergency Department (HOSPITAL_COMMUNITY): Payer: BLUE CROSS/BLUE SHIELD

## 2017-10-09 ENCOUNTER — Inpatient Hospital Stay (HOSPITAL_COMMUNITY)
Admission: EM | Admit: 2017-10-09 | Discharge: 2017-10-17 | DRG: 470 | Disposition: A | Discharge status: Skilled Nursing Facility | Payer: BLUE CROSS/BLUE SHIELD | Attending: Internal Medicine | Admitting: Internal Medicine

## 2017-10-09 DIAGNOSIS — S72002A Fracture of unspecified part of neck of left femur, initial encounter for closed fracture: Secondary | ICD-10-CM | POA: Diagnosis present

## 2017-10-09 DIAGNOSIS — K449 Diaphragmatic hernia without obstruction or gangrene: Secondary | ICD-10-CM | POA: Diagnosis present

## 2017-10-09 DIAGNOSIS — Z9221 Personal history of antineoplastic chemotherapy: Secondary | ICD-10-CM

## 2017-10-09 DIAGNOSIS — C799 Secondary malignant neoplasm of unspecified site: Secondary | ICD-10-CM | POA: Diagnosis present

## 2017-10-09 DIAGNOSIS — F10229 Alcohol dependence with intoxication, unspecified: Secondary | ICD-10-CM | POA: Diagnosis present

## 2017-10-09 DIAGNOSIS — M1990 Unspecified osteoarthritis, unspecified site: Secondary | ICD-10-CM | POA: Diagnosis present

## 2017-10-09 DIAGNOSIS — Y92511 Restaurant or cafe as the place of occurrence of the external cause: Secondary | ICD-10-CM

## 2017-10-09 DIAGNOSIS — M25512 Pain in left shoulder: Secondary | ICD-10-CM

## 2017-10-09 DIAGNOSIS — E890 Postprocedural hypothyroidism: Secondary | ICD-10-CM | POA: Diagnosis present

## 2017-10-09 DIAGNOSIS — K5900 Constipation, unspecified: Secondary | ICD-10-CM | POA: Diagnosis present

## 2017-10-09 DIAGNOSIS — S72032A Displaced midcervical fracture of left femur, initial encounter for closed fracture: Principal | ICD-10-CM | POA: Diagnosis present

## 2017-10-09 DIAGNOSIS — F17210 Nicotine dependence, cigarettes, uncomplicated: Secondary | ICD-10-CM | POA: Diagnosis present

## 2017-10-09 DIAGNOSIS — C439 Malignant melanoma of skin, unspecified: Secondary | ICD-10-CM | POA: Diagnosis present

## 2017-10-09 DIAGNOSIS — E876 Hypokalemia: Secondary | ICD-10-CM | POA: Diagnosis present

## 2017-10-09 DIAGNOSIS — K920 Hematemesis: Secondary | ICD-10-CM | POA: Diagnosis present

## 2017-10-09 DIAGNOSIS — W010XXA Fall on same level from slipping, tripping and stumbling without subsequent striking against object, initial encounter: Secondary | ICD-10-CM | POA: Diagnosis present

## 2017-10-09 DIAGNOSIS — I959 Hypotension, unspecified: Secondary | ICD-10-CM | POA: Diagnosis present

## 2017-10-09 DIAGNOSIS — F321 Major depressive disorder, single episode, moderate: Secondary | ICD-10-CM | POA: Diagnosis present

## 2017-10-09 DIAGNOSIS — E871 Hypo-osmolality and hyponatremia: Secondary | ICD-10-CM | POA: Diagnosis present

## 2017-10-09 DIAGNOSIS — Z85820 Personal history of malignant melanoma of skin: Secondary | ICD-10-CM

## 2017-10-09 DIAGNOSIS — S72009A Fracture of unspecified part of neck of unspecified femur, initial encounter for closed fracture: Secondary | ICD-10-CM | POA: Diagnosis present

## 2017-10-09 DIAGNOSIS — F10920 Alcohol use, unspecified with intoxication, uncomplicated: Secondary | ICD-10-CM | POA: Diagnosis present

## 2017-10-09 DIAGNOSIS — D649 Anemia, unspecified: Secondary | ICD-10-CM | POA: Diagnosis present

## 2017-10-09 DIAGNOSIS — Z85850 Personal history of malignant neoplasm of thyroid: Secondary | ICD-10-CM

## 2017-10-09 LAB — CBC WITH DIFFERENTIAL/PLATELET
Basophils Absolute: 0 10*3/uL (ref 0.0–0.1)
Basophils Relative: 1 %
EOS PCT: 2 %
Eosinophils Absolute: 0.1 10*3/uL (ref 0.0–0.7)
HCT: 32 % — ABNORMAL LOW (ref 36.0–46.0)
Hemoglobin: 11.2 g/dL — ABNORMAL LOW (ref 12.0–15.0)
LYMPHS ABS: 0.9 10*3/uL (ref 0.7–4.0)
LYMPHS PCT: 27 %
MCH: 31.1 pg (ref 26.0–34.0)
MCHC: 35 g/dL (ref 30.0–36.0)
MCV: 88.9 fL (ref 78.0–100.0)
MONO ABS: 0.3 10*3/uL (ref 0.1–1.0)
Monocytes Relative: 11 %
NEUTROS PCT: 59 %
Neutro Abs: 1.9 10*3/uL (ref 1.7–7.7)
PLATELETS: 150 10*3/uL (ref 150–400)
RBC: 3.6 MIL/uL — AB (ref 3.87–5.11)
RDW: 15.9 % — ABNORMAL HIGH (ref 11.5–15.5)
WBC: 3.1 10*3/uL — AB (ref 4.0–10.5)

## 2017-10-09 LAB — COMPREHENSIVE METABOLIC PANEL
ALBUMIN: 3.6 g/dL (ref 3.5–5.0)
ALK PHOS: 79 U/L (ref 38–126)
ALT: 22 U/L (ref 14–54)
ANION GAP: 14 (ref 5–15)
AST: 56 U/L — ABNORMAL HIGH (ref 15–41)
BILIRUBIN TOTAL: 0.6 mg/dL (ref 0.3–1.2)
BUN: 15 mg/dL (ref 6–20)
CALCIUM: 8 mg/dL — AB (ref 8.9–10.3)
CO2: 20 mmol/L — ABNORMAL LOW (ref 22–32)
Chloride: 97 mmol/L — ABNORMAL LOW (ref 101–111)
Creatinine, Ser: 0.96 mg/dL (ref 0.44–1.00)
GFR, EST NON AFRICAN AMERICAN: 59 mL/min — AB (ref >60)
GLUCOSE: 74 mg/dL (ref 65–99)
POTASSIUM: 2.6 mmol/L — AB (ref 3.5–5.1)
Sodium: 131 mmol/L — ABNORMAL LOW (ref 135–145)
TOTAL PROTEIN: 6.3 g/dL — AB (ref 6.5–8.1)

## 2017-10-09 LAB — ETHANOL: ALCOHOL ETHYL (B): 251 mg/dL — AB (ref <10)

## 2017-10-09 MED ORDER — POTASSIUM CHLORIDE CRYS ER 20 MEQ PO TBCR
40.0000 meq | EXTENDED_RELEASE_TABLET | Freq: Once | ORAL | Status: AC
  Administered 2017-10-09: 40 meq via ORAL
  Filled 2017-10-09: qty 2

## 2017-10-09 MED ORDER — SODIUM CHLORIDE 0.9 % IV BOLUS (SEPSIS)
1000.0000 mL | Freq: Once | INTRAVENOUS | Status: AC
  Administered 2017-10-09: 1000 mL via INTRAVENOUS

## 2017-10-09 MED ORDER — THIAMINE HCL 100 MG/ML IJ SOLN
100.0000 mg | Freq: Once | INTRAMUSCULAR | Status: AC
  Administered 2017-10-09: 100 mg via INTRAVENOUS
  Filled 2017-10-09: qty 2

## 2017-10-09 MED ORDER — ACETAMINOPHEN 325 MG PO TABS
650.0000 mg | ORAL_TABLET | Freq: Once | ORAL | Status: AC
  Administered 2017-10-09: 650 mg via ORAL
  Filled 2017-10-09: qty 2

## 2017-10-09 MED ORDER — POTASSIUM CHLORIDE CRYS ER 20 MEQ PO TBCR
60.0000 meq | EXTENDED_RELEASE_TABLET | Freq: Once | ORAL | Status: DC
  Filled 2017-10-09: qty 3

## 2017-10-09 MED ORDER — SODIUM CHLORIDE 0.9 % IV BOLUS (SEPSIS): 2000.0000 mL | Freq: Once | INTRAVENOUS | Status: DC

## 2017-10-09 NOTE — ED Notes (Signed)
went in to patients room and she yelled "I am looking for my ring I threw in the corner when I was mad" I asked to describe the ring and where, she yelled loudly "my finger ring and i don't know where I threw it I was mad!". I searched room floor and blankets. Verified with Jinny Blossom (tech), she could not find the ring either. Pt does have belongings at the bedside, I suggested that she go threw her belongings and see if it  in there.

## 2017-10-09 NOTE — ED Notes (Addendum)
This RN walked into patient's room to find the patient ripped out her IV, took off her monitor leads, and took off her blood pressure cuff. Pt is waving her hands uncontrollably and states that she would like to go home. Will attempt to gain IV access for labs and bolus.

## 2017-10-09 NOTE — ED Notes (Signed)
Date and time results received: 10/09/17 2022 (use smartphrase ".now" to insert current time)  Test:K+ Critical Value: 2.6  Name of Provider Notified: Nanda Quinton  Orders Received? Or Actions Taken?: none

## 2017-10-09 NOTE — ED Notes (Signed)
Bed: TG25 Expected date:  Expected time:  Means of arrival:  Comments: EMS/hypotension

## 2017-10-09 NOTE — ED Provider Notes (Signed)
Emergency Department Provider Note   I have reviewed the triage vital signs and the nursing notes.   HISTORY  Chief Complaint Hypotension and Alcohol Intoxication   HPI Susan Mccarthy is a 66 y.o. female with PMH of EtOH abuse and depression presents to the emergency department for evaluation of alcohol intoxication with resulting fall and hypotension.  Patient is a chronic alcoholic states that she drank a lot of vodka today.  She cannot estimate how much exactly.  She denies taking any pills, using illicit drugs, or other supplements.  She apparently went to a restaurant ordered food.  She then fell on the floor.  Unknown head trauma.  EMS was called and found her to be hypotensive.  They administered 500 and also fluid.  Patient denies any suicidal or homicidal ideation.  She denies any pain.  She states that her left side feels "dead" but this is an ongoing problem for her. No fever or chills. Denies any CP or SOB.   Level 5 caveat: EtOH intoxication.    Past Medical History:  Diagnosis Date  . Anxiety   . Arthritis   . Depression   . ETOH abuse   . History of chemotherapy    INTERFERON  . Insomnia   . Melanoma ([place]) 1980   RIGHT BACK  . Thyroid ca ([place])   . Thyroid disease    85 % BENIGN    Patient Active Problem List   Diagnosis Date Noted  . Hip fracture ([place]) 10/10/2017  . Closed left hip fracture ([place]) 10/09/2017  . 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 Hypotension 06/14/2017  . Hypokalemia 06/14/2017  . AKI (acute kidney injury) ([place]) 06/14/2017  . Normocytic anemia 06/14/2017  . Hyponatremia 06/14/2017  . Alcoholic intoxication without complication ([place])   . Anxiety   . Hypothyroid 05/04/2017  . Paranoia ([place])   . Generalized anxiety disorder 08/06/2016  . Panic disorder 08/06/2016  . Alcohol use  disorder, severe, dependence ([place]) 06/25/2016  . Alcohol withdrawal ([place]) 06/25/2016  . Opioid use disorder, moderate, dependence ([place]) 06/25/2016  . Bipolar disorder, current episode mixed ([place]) 06/25/2016  . Bulimia nervosa 06/25/2016  . Hypothyroidism, postop 07/08/2014  . GERD (gastroesophageal reflux disease) 01/12/2013  . Cancer of thyroid ([place]) 12/19/2012  . Malignant melanoma, metastatic ([place]) 12/10/2012  . Arthritis, degenerative 12/10/2012    Past Surgical History:  Procedure Laterality Date  . AXILLARY NODE DISSECTION  08/02/11   right axillary,  . Excision of melanoma  1980  . RASH     ON FACE AND HANDS S/P CHEMOTHERAPY  . THYROIDECTOMY  1/14   cancer      Allergies Other; Dust mite extract; and Grass extracts [gramineae pollens]  Family History  Problem Relation Age of Onset  . ALS Father     Social History Social History   Tobacco Use  . Smoking status: Current Some Day Smoker    Packs/day: 0.25    Years: 5.00    Pack years: 1.25    Types: Cigarettes    Start date: 01/11/1974  . Smokeless tobacco: Never Used  . Tobacco comment: quit 35 years  Substance Use Topics  . Alcohol use: Yes    Alcohol/week: 0.0 oz  . Drug use: No    Review of Systems  Level 5 caveat: EtOH intoxication.   ____________________________________________   PHYSICAL EXAM:  VITAL  SIGNS:  Vitals:   10/10/17 1201 10/10/17 1243  BP: (!) 129/99 (!) 127/95  Pulse: 85 91  Resp:  18  Temp:  98.5 F (36.9 C)  SpO2:  94%   Constitutional: Alert but belligerent.  Eyes: Conjunctivae are normal. PERRL.  Head: Atraumatic. Nose: No congestion/rhinnorhea. Mouth/Throat: Mucous membranes are dry.  Neck: No stridor.  No cervical spine tenderness to palpation. Cardiovascular: NSR. Good peripheral circulation. Grossly normal heart sounds.   Respiratory: Normal respiratory effort.  No retractions. Lungs CTAB. Gastrointestinal: Soft and nontender. No distention.  Musculoskeletal:  No lower extremity tenderness nor edema. No gross deformities of extremities. Neurologic:  Positive slurred speech. No gross focal neurologic deficits are appreciated.  Skin:  Skin is warm, dry and intact. No rash noted.  ____________________________________________   LABS (all labs ordered are listed, but only abnormal results are displayed)  Labs Reviewed  COMPREHENSIVE METABOLIC PANEL - Abnormal; Notable for the following components:      Result Value   Sodium 131 (*)    Potassium 2.6 (*)    Chloride 97 (*)    CO2 20 (*)    Calcium 8.0 (*)    Total Protein 6.3 (*)    AST 56 (*)    GFR calc non Af Amer 59 (*)    All other components within normal limits  ETHANOL - Abnormal; Notable for the following components:   Alcohol, Ethyl (B) 251 (*)    All other components within normal limits  CBC WITH DIFFERENTIAL/PLATELET - Abnormal; Notable for the following components:   WBC 3.1 (*)    RBC 3.60 (*)    Hemoglobin 11.2 (*)    HCT 32.0 (*)    RDW 15.9 (*)    All other components within normal limits  COMPREHENSIVE METABOLIC PANEL - Abnormal; Notable for the following components:   Sodium 134 (*)    Potassium 3.0 (*)    Calcium 8.0 (*)    Total Protein 6.0 (*)    AST 53 (*)    All other components within normal limits  CBC WITH DIFFERENTIAL/PLATELET - Abnormal; Notable for the following components:   RBC 3.63 (*)    Hemoglobin 11.3 (*)    HCT 32.7 (*)    RDW 16.4 (*)    Lymphs Abs 0.6 (*)    All other components within normal limits  CBC - Abnormal; Notable for the following components:   RBC 3.84 (*)    Hemoglobin 11.6 (*)    HCT 34.3 (*)    RDW 16.2 (*)    All other components within normal limits  CBC - Abnormal; Notable for the following components:   RBC 3.72 (*)    Hemoglobin 11.6 (*)    HCT 33.7 (*)    RDW 16.4 (*)    All other components within normal limits  TSH - Abnormal; Notable for the following components:   TSH 86.127 (*)    All other components  within normal limits  SURGICAL PCR SCREEN  PROTIME-INR  TROPONIN I  CBC  TYPE AND SCREEN   ____________________________________________  EKG   EKG Interpretation  Date/Time:  Wednesday October 09 2017 17:16:04 EST Ventricular Rate:  58 PR Interval:    QRS Duration: 86 QT Interval:  459 QTC Calculation: 451 R Axis:   94 Text Interpretation:  Sinus rhythm Anteroseptal infarct, age indeterminate No STEMI.  Confirmed by Nanda Quinton [phone]) on 10/09/2017 5:55:54 PM Also confirmed by Nanda Quinton [phone]), editor Hattie Perch ([phone])  on  10/10/2017 7:29:16 AM       ____________________________________________  RADIOLOGY  Dg Chest Portable 1 View  Result Date: 10/09/2017 CLINICAL DATA:  Preop, fractured hip EXAM: PORTABLE CHEST 1 VIEW COMPARISON:  08/10/2017 FINDINGS: Surgical clips in the right axilla. Old distal right clavicle deformity. No acute pulmonary infiltrate, consolidation or effusion. Cardiomediastinal silhouette probably within normal limits allowing for rotation which exaggerates the mediastinal width. No pneumothorax. Multiple old bilateral rib fractures. IMPRESSION: Rotated patient. No radiographic evidence for acute cardiopulmonary abnormality. Electronically Signed   By: Donavan Foil M.D.   On: 10/09/2017 23:41   Dg Shoulder Left Port  Result Date: 10/10/2017 CLINICAL DATA:  Pain EXAM: LEFT SHOULDER - 2 VIEW COMPARISON:  August 10, 2017 FINDINGS: Frontal and Y scapular images were obtained. No acute fracture or dislocation. There is moderate generalized osteoarthritic change. No erosive change or intra-articular calcification. There is evidence of old rib trauma on the left. Visualized left lung clear. IMPRESSION: Moderate generalized osteoarthritic change. No acute fracture or dislocation. Evidence of old rib trauma on the left. Electronically Signed   By: Lowella Grip III M.D.   On: 10/10/2017 08:53   Dg Hip Unilat W Or Wo Pelvis 2-3 Views  Left  Result Date: 10/09/2017 CLINICAL DATA:  Left hip pain EXAM: DG HIP (WITH OR WITHOUT PELVIS) 2-3V LEFT COMPARISON:  None. FINDINGS: There are is a transverse fracture of the left femoral neck with associated overriding. The femoral head remains approximated within the acetabulum. No abnormal lucency surrounding the bilateral sacroiliac screws. IMPRESSION: Left femoral neck fracture without dislocation. Electronically Signed   By: Ulyses Jarred M.D.   On: 10/09/2017 22:56    ____________________________________________   PROCEDURES  Procedure(s) performed:   Procedures  CRITICAL CARE Performed by: Margette Fast Total critical care time: 40 minutes Critical care time was exclusive of separately billable procedures and treating other patients. Critical care was necessary to treat or prevent imminent or life-threatening deterioration. Critical care was time spent personally by me on the following activities: development of treatment plan with patient and/or surrogate as well as nursing, discussions with consultants, evaluation of patient's response to treatment, examination of patient, obtaining history from patient or surrogate, ordering and performing treatments and interventions, ordering and review of laboratory studies, ordering and review of radiographic studies, pulse oximetry and re-evaluation of patient's condition.  Nanda Quinton, MD Emergency Medicine  ____________________________________________   INITIAL IMPRESSION / ASSESSMENT AND PLAN / ED COURSE  Pertinent labs & imaging results that were available during my care of the patient were reviewed by me and considered in my medical decision making (see chart for details).  Patient presents to the emergency department for evaluation of alcohol intoxication.  She is found to be hypotensive to the 60s on scene.  Denies using or taking any other sedating medications.  Patient is awake and alert on my exam but does have some  slurred speech and a slightly belligerent.  Plan for IV fluids, labs, supportive care, reassessment after sobering.  No evidence of head or neck trauma at this time.  Patient spontaneously moving all extremities pain in her chest, abdomen, pelvis.   09:32 PM BP improving with IVF and as the patient sobers clinically.   11:22 PM The patient sobers she begins complaining of left hip pain.  X-ray shows a displaced left femoral neck fracture.  Spoke with Dr. Percell Miller with ortho who requests admission to the hospitalist for [facility] and to have the patient stay NPO after midnight.  Have ordered CXR and other pre-op labs.   Discussed patient's case with Hospitalist, Dr. Hal Hope to request admission. Patient and family (if present) updated with plan. Care transferred to Hospitalist service.  I reviewed all nursing notes, vitals, pertinent old records, EKGs, labs, imaging (as available).  ____________________________________________  FINAL CLINICAL IMPRESSION(S) / ED DIAGNOSES  Final diagnoses:  Closed fracture of left hip, initial encounter ([place])  Alcoholic intoxication without complication (HCC)  Hypotension, unspecified hypotension type     MEDICATIONS GIVEN DURING THIS VISIT:  Medications  morphine 4 MG/ML injection 0.52 mg (0.52 mg Intravenous Given 10/10/17 1201)  pantoprazole (PROTONIX) 80 mg in sodium chloride 0.9 % 250 mL (0.32 mg/mL) infusion (8 mg/hr Intravenous New Bag/Given 10/10/17 1137)  pantoprazole (PROTONIX) injection 40 mg (not administered)  LORazepam (ATIVAN) tablet 1 mg (1 mg Oral Given 10/10/17 1201)    Or  LORazepam (ATIVAN) injection 1 mg ( Intravenous See Alternative 19/4/17 4081)  folic acid (FOLVITE) tablet 1 mg (1 mg Oral Given 10/10/17 1103)  multivitamin with minerals tablet 1 tablet (1 tablet Oral Given 10/10/17 1104)  chlorhexidine (HIBICLENS) 4 % liquid 4 application (not administered)  povidone-iodine 10 % swab 2 application (not administered)  ceFAZolin (ANCEF)  IVPB 2g/100 mL premix (not administered)  lactated ringers infusion (not administered)  potassium chloride SA (K-DUR,KLOR-CON) CR tablet 40 mEq (40 mEq Oral Given 10/10/17 1104)  chlordiazePOXIDE (LIBRIUM) capsule 25 mg (25 mg Oral Given 10/10/17 1104)    Followed by  chlordiazePOXIDE (LIBRIUM) capsule 25 mg (not administered)    Followed by  chlordiazePOXIDE (LIBRIUM) capsule 25 mg (not administered)    Followed by  chlordiazePOXIDE (LIBRIUM) capsule 25 mg (not administered)  LORazepam (ATIVAN) tablet 1 mg (not administered)    Or  LORazepam (ATIVAN) injection 1 mg (not administered)  thiamine (B-1) injection 100 mg (100 mg Intravenous Given 10/10/17 1105)  sodium chloride 0.9 % bolus 1,000 mL (0 mLs Intravenous Stopped 10/09/17 2000)  sodium chloride 0.9 % bolus 1,000 mL (0 mLs Intravenous Stopped 10/09/17 2043)  potassium chloride SA (K-DUR,KLOR-CON) CR tablet 40 mEq (40 mEq Oral Given 10/09/17 2208)  thiamine (B-1) injection 100 mg (100 mg Intravenous Given 10/09/17 2209)  acetaminophen (TYLENOL) tablet 650 mg (650 mg Oral Given 10/09/17 2208)  ondansetron (ZOFRAN) injection 4 mg (4 mg Intravenous Given 10/10/17 0157)  pantoprazole (PROTONIX) 80 mg in sodium chloride 0.9 % 100 mL IVPB (0 mg Intravenous Stopped 10/10/17 0536)    Note:  This document was prepared using Dragon voice recognition software and may include unintentional dictation errors.  Nanda Quinton, MD Emergency Medicine    Carlus Stay, [facility], MD 10/10/17 [phone]

## 2017-10-09 NOTE — ED Notes (Signed)
ED Provider at bedside. 

## 2017-10-09 NOTE — ED Notes (Signed)
Pt yelling out that she needs pain medication and says she needs to go to use the bathroom. I told her we need to try to get up and go to the bathroom "I am not getting the hell up right now!, I fell because of anxiety. I regurgitated on myself because of anxiety. My mother died and I am in mourning. I want to leave this place now!" discussed with her that EDP would like for her to ambulate prior to discharge. Pt continuing to yell and curse at this RN

## 2017-10-09 NOTE — ED Triage Notes (Signed)
Pt arrived by [facility] with ETOH intoxication and hypotension. EMS reports a blood pressure of 68/40. EMS reports absent radial pulses and a bradycardic brachial pulse. Per EMS, pt has prescriptions for multiple barbituate and psychotropic medications.   Pt takes an uber to [employer] store, goes to [employer], and orders food then falls on the floor frequently.

## 2017-10-10 ENCOUNTER — Encounter (HOSPITAL_COMMUNITY): Payer: Self-pay | Admitting: *Deleted

## 2017-10-10 ENCOUNTER — Inpatient Hospital Stay (HOSPITAL_COMMUNITY): Payer: BLUE CROSS/BLUE SHIELD

## 2017-10-10 DIAGNOSIS — S72009A Fracture of unspecified part of neck of unspecified femur, initial encounter for closed fracture: Secondary | ICD-10-CM | POA: Diagnosis present

## 2017-10-10 DIAGNOSIS — E876 Hypokalemia: Secondary | ICD-10-CM

## 2017-10-10 DIAGNOSIS — F10920 Alcohol use, unspecified with intoxication, uncomplicated: Secondary | ICD-10-CM | POA: Diagnosis not present

## 2017-10-10 DIAGNOSIS — S72002A Fracture of unspecified part of neck of left femur, initial encounter for closed fracture: Secondary | ICD-10-CM | POA: Diagnosis not present

## 2017-10-10 LAB — COMPREHENSIVE METABOLIC PANEL
ALT: 22 U/L (ref 14–54)
AST: 53 U/L — ABNORMAL HIGH (ref 15–41)
Albumin: 3.7 g/dL (ref 3.5–5.0)
Alkaline Phosphatase: 81 U/L (ref 38–126)
Anion gap: 10 (ref 5–15)
BILIRUBIN TOTAL: 0.9 mg/dL (ref 0.3–1.2)
BUN: 11 mg/dL (ref 6–20)
CHLORIDE: 102 mmol/L (ref 101–111)
CO2: 22 mmol/L (ref 22–32)
Calcium: 8 mg/dL — ABNORMAL LOW (ref 8.9–10.3)
Creatinine, Ser: 0.92 mg/dL (ref 0.44–1.00)
Glucose, Bld: 70 mg/dL (ref 65–99)
POTASSIUM: 3 mmol/L — AB (ref 3.5–5.1)
Sodium: 134 mmol/L — ABNORMAL LOW (ref 135–145)
TOTAL PROTEIN: 6 g/dL — AB (ref 6.5–8.1)

## 2017-10-10 LAB — CBC
HEMATOCRIT: 34.3 % — AB (ref 36.0–46.0)
HEMATOCRIT: 33.7 % — AB (ref 36.0–46.0)
HEMOGLOBIN: 11.6 g/dL — AB (ref 12.0–15.0)
Hemoglobin: 11.6 g/dL — ABNORMAL LOW (ref 12.0–15.0)
MCH: 30.2 pg (ref 26.0–34.0)
MCH: 31.2 pg (ref 26.0–34.0)
MCHC: 33.8 g/dL (ref 30.0–36.0)
MCHC: 34.4 g/dL (ref 30.0–36.0)
MCV: 89.3 fL (ref 78.0–100.0)
MCV: 90.6 fL (ref 78.0–100.0)
PLATELETS: 160 10*3/uL (ref 150–400)
Platelets: 168 10*3/uL (ref 150–400)
RBC: 3.84 MIL/uL — ABNORMAL LOW (ref 3.87–5.11)
RBC: 3.72 MIL/uL — AB (ref 3.87–5.11)
RDW: 16.2 % — ABNORMAL HIGH (ref 11.5–15.5)
RDW: 16.4 % — ABNORMAL HIGH (ref 11.5–15.5)
WBC: 4.9 10*3/uL (ref 4.0–10.5)
WBC: 5.3 10*3/uL (ref 4.0–10.5)

## 2017-10-10 LAB — TYPE AND SCREEN
ABO/RH(D): B POS
Antibody Screen: NEGATIVE

## 2017-10-10 LAB — CBC WITH DIFFERENTIAL/PLATELET
BASOS ABS: 0 10*3/uL (ref 0.0–0.1)
Basophils Relative: 0 %
EOS PCT: 1 %
Eosinophils Absolute: 0 10*3/uL (ref 0.0–0.7)
HCT: 32.7 % — ABNORMAL LOW (ref 36.0–46.0)
Hemoglobin: 11.3 g/dL — ABNORMAL LOW (ref 12.0–15.0)
LYMPHS PCT: 13 %
Lymphs Abs: 0.6 10*3/uL — ABNORMAL LOW (ref 0.7–4.0)
MCH: 31.1 pg (ref 26.0–34.0)
MCHC: 34.6 g/dL (ref 30.0–36.0)
MCV: 90.1 fL (ref 78.0–100.0)
Monocytes Absolute: 0.3 10*3/uL (ref 0.1–1.0)
Monocytes Relative: 7 %
NEUTROS ABS: 3.8 10*3/uL (ref 1.7–7.7)
NEUTROS PCT: 79 %
PLATELETS: 166 10*3/uL (ref 150–400)
RBC: 3.63 MIL/uL — AB (ref 3.87–5.11)
RDW: 16.4 % — ABNORMAL HIGH (ref 11.5–15.5)
WBC: 4.8 10*3/uL (ref 4.0–10.5)

## 2017-10-10 LAB — SURGICAL PCR SCREEN
MRSA, PCR: NEGATIVE
Staphylococcus aureus: POSITIVE — AB

## 2017-10-10 LAB — PROTIME-INR
INR: 0.94
PROTHROMBIN TIME: 12.5 s (ref 11.4–15.2)

## 2017-10-10 LAB — TROPONIN I: Troponin I: <0.03 ng/mL (ref <0.03)

## 2017-10-10 LAB — TSH: TSH: 86.127 u[IU]/mL — ABNORMAL HIGH (ref 0.350–4.500)

## 2017-10-10 MED ORDER — CHLORDIAZEPOXIDE HCL 25 MG PO CAPS
25.0000 mg | ORAL_CAPSULE | Freq: Four times a day (QID) | ORAL | Status: AC
  Administered 2017-10-10 (×4): 25 mg via ORAL
  Filled 2017-10-10 (×4): qty 1

## 2017-10-10 MED ORDER — MORPHINE SULFATE (PF) 4 MG/ML IV SOLN
0.5000 mg | INTRAVENOUS | Status: DC | PRN
  Administered 2017-10-10 – 2017-10-11 (×6): 0.52 mg via INTRAVENOUS
  Filled 2017-10-10 (×6): qty 1

## 2017-10-10 MED ORDER — LORAZEPAM 2 MG/ML IJ SOLN
1.0000 mg | Freq: Four times a day (QID) | INTRAMUSCULAR | Status: AC | PRN
  Administered 2017-10-11: 1 mg via INTRAVENOUS

## 2017-10-10 MED ORDER — POTASSIUM CHLORIDE CRYS ER 20 MEQ PO TBCR
40.0000 meq | EXTENDED_RELEASE_TABLET | ORAL | Status: AC
  Administered 2017-10-10 (×2): 40 meq via ORAL
  Filled 2017-10-10 (×2): qty 2

## 2017-10-10 MED ORDER — ONDANSETRON HCL 4 MG/2ML IJ SOLN
INTRAMUSCULAR | Status: AC
  Filled 2017-10-10: qty 2

## 2017-10-10 MED ORDER — CHLORDIAZEPOXIDE HCL 25 MG PO CAPS
25.0000 mg | ORAL_CAPSULE | Freq: Two times a day (BID) | ORAL | Status: AC
  Administered 2017-10-12: 25 mg via ORAL
  Filled 2017-10-10 (×2): qty 1

## 2017-10-10 MED ORDER — THIAMINE HCL 100 MG/ML IJ SOLN: 100.0000 mg | Freq: Every day | INTRAMUSCULAR | Status: DC

## 2017-10-10 MED ORDER — CHLORHEXIDINE GLUCONATE 4 % EX LIQD
60.0000 mL | Freq: Once | CUTANEOUS | Status: AC
  Administered 2017-10-11: 4 via TOPICAL

## 2017-10-10 MED ORDER — LORAZEPAM 2 MG/ML IJ SOLN
1.0000 mg | Freq: Four times a day (QID) | INTRAMUSCULAR | Status: DC | PRN
  Filled 2017-10-10: qty 1

## 2017-10-10 MED ORDER — ADULT MULTIVITAMIN W/MINERALS CH: 1.0000 | ORAL_TABLET | Freq: Every day | ORAL | Status: DC

## 2017-10-10 MED ORDER — ONDANSETRON HCL 4 MG/2ML IJ SOLN
4.0000 mg | Freq: Once | INTRAMUSCULAR | Status: AC | PRN
  Administered 2017-10-10: 4 mg via INTRAVENOUS

## 2017-10-10 MED ORDER — PANTOPRAZOLE SODIUM 40 MG IV SOLR: 40.0000 mg | Freq: Two times a day (BID) | INTRAVENOUS | Status: DC

## 2017-10-10 MED ORDER — FOLIC ACID 1 MG PO TABS: 1.0000 mg | ORAL_TABLET | Freq: Every day | ORAL | Status: DC

## 2017-10-10 MED ORDER — LORAZEPAM 2 MG/ML IJ SOLN: 0.0000 mg | Freq: Two times a day (BID) | INTRAMUSCULAR | Status: DC

## 2017-10-10 MED ORDER — LORAZEPAM 1 MG PO TABS
1.0000 mg | ORAL_TABLET | Freq: Four times a day (QID) | ORAL | Status: DC | PRN
  Administered 2017-10-10 (×2): 1 mg via ORAL
  Filled 2017-10-10 (×2): qty 1

## 2017-10-10 MED ORDER — VITAMIN B-1 100 MG PO TABS: 100.0000 mg | ORAL_TABLET | Freq: Every day | ORAL | Status: DC

## 2017-10-10 MED ORDER — LORAZEPAM 2 MG/ML IJ SOLN: 1.0000 mg | Freq: Four times a day (QID) | INTRAMUSCULAR | Status: DC | PRN

## 2017-10-10 MED ORDER — PANTOPRAZOLE SODIUM 40 MG IV SOLR
8.0000 mg/h | INTRAVENOUS | Status: DC
  Administered 2017-10-10 – 2017-10-11 (×4): 8 mg/h via INTRAVENOUS
  Filled 2017-10-10 (×8): qty 80

## 2017-10-10 MED ORDER — CHLORDIAZEPOXIDE HCL 25 MG PO CAPS
25.0000 mg | ORAL_CAPSULE | Freq: Three times a day (TID) | ORAL | Status: AC
  Administered 2017-10-11: 25 mg via ORAL
  Filled 2017-10-10: qty 1

## 2017-10-10 MED ORDER — LORAZEPAM 1 MG PO TABS
1.0000 mg | ORAL_TABLET | Freq: Four times a day (QID) | ORAL | Status: AC | PRN
  Administered 2017-10-11 – 2017-10-12 (×3): 1 mg via ORAL
  Filled 2017-10-10 (×3): qty 1

## 2017-10-10 MED ORDER — LACTATED RINGERS IV SOLN
INTRAVENOUS | Status: DC
  Administered 2017-10-11: 11:00:00 via INTRAVENOUS

## 2017-10-10 MED ORDER — LORAZEPAM 1 MG PO TABS: 1.0000 mg | ORAL_TABLET | Freq: Four times a day (QID) | ORAL | Status: DC | PRN

## 2017-10-10 MED ORDER — MORPHINE SULFATE (PF) 2 MG/ML IV SOLN
0.5000 mg | INTRAVENOUS | Status: DC | PRN
  Administered 2017-10-10: 0.5 mg via INTRAVENOUS
  Filled 2017-10-10: qty 1

## 2017-10-10 MED ORDER — CEFAZOLIN SODIUM-DEXTROSE 2-4 GM/100ML-% IV SOLN: 2.0000 g | INTRAVENOUS | Status: AC

## 2017-10-10 MED ORDER — CHLORDIAZEPOXIDE HCL 25 MG PO CAPS
25.0000 mg | ORAL_CAPSULE | Freq: Every day | ORAL | Status: AC
  Administered 2017-10-13: 25 mg via ORAL
  Filled 2017-10-10: qty 1

## 2017-10-10 MED ORDER — LORAZEPAM 2 MG/ML IJ SOLN: 0.0000 mg | Freq: Four times a day (QID) | INTRAMUSCULAR | Status: DC

## 2017-10-10 MED ORDER — SODIUM CHLORIDE 0.9 % IV SOLN
80.0000 mg | Freq: Once | INTRAVENOUS | Status: AC
  Administered 2017-10-10: 80 mg via INTRAVENOUS
  Filled 2017-10-10: qty 80

## 2017-10-10 MED ORDER — ADULT MULTIVITAMIN W/MINERALS CH
1.0000 | ORAL_TABLET | Freq: Every day | ORAL | Status: DC
  Administered 2017-10-10 – 2017-10-17 (×7): 1 via ORAL
  Filled 2017-10-10 (×7): qty 1

## 2017-10-10 MED ORDER — THIAMINE HCL 100 MG/ML IJ SOLN
100.0000 mg | Freq: Every day | INTRAMUSCULAR | Status: DC
  Administered 2017-10-10: 100 mg via INTRAVENOUS
  Filled 2017-10-10 (×2): qty 2

## 2017-10-10 MED ORDER — FOLIC ACID 1 MG PO TABS
1.0000 mg | ORAL_TABLET | Freq: Every day | ORAL | Status: DC
  Administered 2017-10-10 – 2017-10-17 (×7): 1 mg via ORAL
  Filled 2017-10-10 (×8): qty 1

## 2017-10-10 MED ORDER — POVIDONE-IODINE 10 % EX SWAB
2.0000 "application " | Freq: Once | CUTANEOUS | Status: AC
  Administered 2017-10-11: 2 via TOPICAL

## 2017-10-10 NOTE — Consult Note (Signed)
[facility] Consultation Note  Referring Provider: Dr. Patrecia Pour [facility]) Primary Care Physician:  Patient, No Pcp Per  Reason for Consultation:  hematemesis  HPI: Susan Mccarthy is a 66 y.o. female admitted with left hip fracture.  Patient has history of metastatic melanoma and alcohol abuse.  She had syncopal spell which patient attributed to anxiety.  While in ED patient had some nausea and vomiting with some bleeding.  Reports intermittent spells of hematemesis versus hemoptysis over the past nearly one year.  Denies melena or hematochezia.  Some epigastric tenderness after retching.   Past Medical History:  Diagnosis Date  . Anxiety   . Arthritis   . Depression   . ETOH abuse   . History of chemotherapy    INTERFERON  . Insomnia   . Melanoma ([place]) 1980   RIGHT BACK  . Thyroid ca ([place])   . Thyroid disease    85 % BENIGN    Past Surgical History:  Procedure Laterality Date  . AXILLARY NODE DISSECTION  08/02/11   right axillary,  . Excision of melanoma  1980  . RASH     ON FACE AND HANDS S/P CHEMOTHERAPY  . THYROIDECTOMY  1/14   cancer    Prior to Admission medications   Medication Sig Start Date End Date Taking? Authorizing Provider  hydrOXYzine (ATARAX/VISTARIL) 25 MG tablet Take 0.5 tablets (12.5 mg total) by mouth every 6 (six) hours as needed for anxiety. Patient not taking: Reported on 10/10/2017 08/25/17   Pisciotta, Elmyra Ricks, PA-C  levothyroxine (SYNTHROID, LEVOTHROID) 100 MCG tablet Take 1 tablet (100 mcg total) by mouth daily before breakfast. Patient not taking: Reported on 08/25/2017 07/31/17 08/30/17  Glyn Ade, PA-C  Multiple Vitamin (MULTIVITAMIN WITH MINERALS) TABS tablet Take 1 tablet by mouth daily. Patient not taking: Reported on 08/25/2017 06/19/17   Patrecia Pour, Christean Grief, MD  potassium chloride SA (K-DUR,KLOR-CON) 20 MEQ tablet Take 1 tablet (20 mEq total) by mouth 2 (two) times daily. Patient not taking: Reported on 08/25/2017 08/10/17    Sherwood Gambler, MD  thiamine (B-1) 100 MG/ML injection Inject 1 mL (100 mg total) into the vein daily. Patient not taking: Reported on 08/25/2017 06/19/17   Doreatha Lew, MD    Current Facility-Administered Medications  Medication Dose Route Frequency Provider Last Rate Last Dose  . ceFAZolin (ANCEF) IVPB 2g/100 mL premix  2 g Intravenous On Call to OR Prudencio Burly III, PA-C      . chlordiazePOXIDE (LIBRIUM) capsule 25 mg  25 mg Oral QID Patrecia Pour, Christean Grief, MD   25 mg at 10/10/17 1104   Followed by  . [START ON 10/11/2017] chlordiazePOXIDE (LIBRIUM) capsule 25 mg  25 mg Oral TID Doreatha Lew, MD       Followed by  . [START ON 10/12/2017] chlordiazePOXIDE (LIBRIUM) capsule 25 mg  25 mg Oral BID Doreatha Lew, MD       Followed by  . [START ON 10/13/2017] chlordiazePOXIDE (LIBRIUM) capsule 25 mg  25 mg Oral Daily Patrecia Pour, Christean Grief, MD      . chlorhexidine (HIBICLENS) 4 % liquid 4 application  60 mL Topical Once Martensen, Charna Elizabeth III, PA-C      . folic acid (FOLVITE) tablet 1 mg  1 mg Oral Daily Rise Patience, MD   1 mg at 10/10/17 1103  . lactated ringers infusion   Intravenous Continuous Prudencio Burly III, PA-C      . LORazepam (ATIVAN) tablet 1 mg  1  mg Oral Q6H PRN Rise Patience, MD   1 mg at 10/10/17 1201   Or  . LORazepam (ATIVAN) injection 1 mg  1 mg Intravenous Q6H PRN Rise Patience, MD      . LORazepam (ATIVAN) tablet 1 mg  1 mg Oral Q6H PRN Patrecia Pour, Christean Grief, MD       Or  . LORazepam (ATIVAN) injection 1 mg  1 mg Intravenous Q6H PRN Patrecia Pour, Christean Grief, MD      . morphine 4 MG/ML injection 0.52 mg  0.52 mg Intravenous Q2H PRN Rise Patience, MD   0.52 mg at 10/10/17 1201  . multivitamin with minerals tablet 1 tablet  1 tablet Oral Daily Rise Patience, MD   1 tablet at 10/10/17 1104  . pantoprazole (PROTONIX) 80 mg in sodium chloride 0.9 % 250 mL (0.32 mg/mL) infusion  8 mg/hr Intravenous Continuous  Rise Patience, MD 25 mL/hr at 10/10/17 1137 8 mg/hr at 10/10/17 1137  . [START ON 10/13/2017] pantoprazole (PROTONIX) injection 40 mg  40 mg Intravenous Q12H Rise Patience, MD      . potassium chloride SA (K-DUR,KLOR-CON) CR tablet 40 mEq  40 mEq Oral Q4H Patrecia Pour, Christean Grief, MD   40 mEq at 10/10/17 1104  . povidone-iodine 10 % swab 2 application  2 application Topical Once Prudencio Burly III, PA-C      . thiamine (B-1) injection 100 mg  100 mg Intravenous Daily Patrecia Pour, Christean Grief, MD   100 mg at 10/10/17 1105   Facility-Administered Medications Ordered in Other Encounters  Medication Dose Route Frequency Provider Last Rate Last Dose  . LORazepam (ATIVAN) injection 0.5 mg  0.5 mg Intravenous Once Volanda Napoleon, MD        Allergies as of 10/09/2017 - Review Complete 10/09/2017  Allergen Reaction Noted  . Other Rash and Other (See Comments) 11/28/2016  . Dust mite extract  09/09/2017  . Grass extracts [gramineae pollens]  09/09/2017    Family History  Problem Relation Age of Onset  . ALS Father     Social History   Socioeconomic History  . Marital status: Married    Spouse name: Not on file  . Number of children: Not on file  . Years of education: Not on file  . Highest education level: Not on file  Social Needs  . Financial resource strain: Not on file  . Food insecurity - worry: Not on file  . Food insecurity - inability: Not on file  . Transportation needs - medical: Not on file  . Transportation needs - non-medical: Not on file  Occupational History  . Not on file  Tobacco Use  . Smoking status: Current Some Day Smoker    Packs/day: 0.25    Years: 5.00    Pack years: 1.25    Types: Cigarettes    Start date: 01/11/1974    Last attempt to quit: 06/07/1979    Years since quitting: 38.3  . Smokeless tobacco: Never Used  . Tobacco comment: quit 35 years  Substance and Sexual Activity  . Alcohol use: Yes    Alcohol/week: 0.0 oz  . Drug use: No   . Sexual activity: Not Currently  Other Topics Concern  . Not on file  Social History Narrative   Married   Stressful marriage    Review of Systems: As per HPI, all others negative.  Physical Exam: Vital signs in last 24 hours: Temp:  [98.2 F (36.8 C)-99.6 F (37.6  C)] 98.5 F (36.9 C) (11/08 1243) Pulse Rate:  [54-91] 91 (11/08 1243) Resp:  [16-22] 18 (11/08 1243) BP: (77-174)/(42-99) 127/95 (11/08 1243) SpO2:  [91 %-99 %] 94 % (11/08 1243) Weight:  [120 lb (54.4 kg)] 120 lb (54.4 kg) (11/08 1123) Last BM Date: 10/08/17 General:   NAD, pressured speech and tangential discussions; variable level of alertness Head:  Normocephalic and atraumatic. Eyes:  Sclera clear, no icterus.   Conjunctiva pink. Ears:  Normal auditory acuity. Nose:  No deformity, discharge,  or lesions. Mouth:  No deformity or lesions.  Oropharynx pink & moist. Neck:  Supple; no masses or thyromegaly. Abdomen:  Soft, mild epigastric tenderness without peritonitis, nondistended. No masses, hepatosplenomegaly or hernias noted. Normal bowel sounds, without guarding, and without rebound.     Msk:  Left hip deformity, otherwise without symmetrical without gross deformities. Normal posture. Pulses:  Normal pulses noted. Extremities:  Without clubbing or edema. Neurologic:  Fluctuating levels of alertness Skin:  Intact without significant lesions or rashes. Psych:  Alert and cooperative. Normal mood and affect.   Lab Results: Recent Labs    10/10/17 0409 10/10/17 0732 10/10/17 1050  WBC 4.9 4.8 5.3  HGB 11.6* 11.3* 11.6*  HCT 34.3* 32.7* 33.7*  PLT 168 166 160   BMET Recent Labs    10/09/17 1929 10/10/17 0732  NA 131* 134*  K 2.6* 3.0*  CL 97* 102  CO2 20* 22  GLUCOSE 74 70  BUN 15 11  CREATININE 0.96 0.92  CALCIUM 8.0* 8.0*   LFT Recent Labs    10/10/17 0732  PROT 6.0*  ALBUMIN 3.7  AST 53*  ALT 22  ALKPHOS 81  BILITOT 0.9   PT/INR Recent Labs    10/09/17 1929  LABPROT 12.5   INR 0.94    Studies/Results: Dg Chest Portable 1 View  Result Date: 10/09/2017 CLINICAL DATA:  Preop, fractured hip EXAM: PORTABLE CHEST 1 VIEW COMPARISON:  08/10/2017 FINDINGS: Surgical clips in the right axilla. Old distal right clavicle deformity. No acute pulmonary infiltrate, consolidation or effusion. Cardiomediastinal silhouette probably within normal limits allowing for rotation which exaggerates the mediastinal width. No pneumothorax. Multiple old bilateral rib fractures. IMPRESSION: Rotated patient. No radiographic evidence for acute cardiopulmonary abnormality. Electronically Signed   By: Donavan Foil M.D.   On: 10/09/2017 23:41   Dg Shoulder Left Port  Result Date: 10/10/2017 CLINICAL DATA:  Pain EXAM: LEFT SHOULDER - 2 VIEW COMPARISON:  August 10, 2017 FINDINGS: Frontal and Y scapular images were obtained. No acute fracture or dislocation. There is moderate generalized osteoarthritic change. No erosive change or intra-articular calcification. There is evidence of old rib trauma on the left. Visualized left lung clear. IMPRESSION: Moderate generalized osteoarthritic change. No acute fracture or dislocation. Evidence of old rib trauma on the left. Electronically Signed   By: Lowella Grip III M.D.   On: 10/10/2017 08:53   Dg Hip Unilat W Or Wo Pelvis 2-3 Views Left  Result Date: 10/09/2017 CLINICAL DATA:  Left hip pain EXAM: DG HIP (WITH OR WITHOUT PELVIS) 2-3V LEFT COMPARISON:  None. FINDINGS: There are is a transverse fracture of the left femoral neck with associated overriding. The femoral head remains approximated within the acetabulum. No abnormal lucency surrounding the bilateral sacroiliac screws. IMPRESSION: Left femoral neck fracture without dislocation. Electronically Signed   By: Ulyses Jarred M.D.   On: 10/09/2017 22:56    Impression:  1.  Left hip fracture. 2.  Hematemesis.  Patient relays that this has  been ongoing problem for nearly one year. 3.  Anemia,  mild. 4.  Alcohol abuse. 5.  Metastatic melanoma.  Plan:  1.  I have discussed endoscopy with patient, but given the intermittent and longstanding nature of her bleeding and the fact that it'd be very technically challenging (and potentially risk prohibitive) to do endoscopy on left side with fresh left hip fracture (would have to do procedure on patient's back, or intubated, which places patient at much higher sedation risk), I think it would be best to treat patient medically re: GI bleed (with PPI, serial CBCs) and to go ahead and proceed with hip surgery (as deemed appropriate by orthopedists) and only proceed with endoscopy as inpatient should patient have recurrent witnessed destabilizing GI bleeding while an inpatient.  Otherwise, would advocate outpatient endoscopy once patient has had chance to recover from hip fracture surgery. 2.  [facility] will follow.   LOS: 0 days   Francely Craw M  10/10/2017, 1:44 PM  Cell [phone] If no answer or after 5 PM call [phone]

## 2017-10-10 NOTE — ED Notes (Signed)
[facility] has arrived to the patients bedside, pt vomited a large amount of dark red emesis. Vitals stable. Dr Adrienne Mocha notified of the same via telephone. Receiving rn notified of change in condition

## 2017-10-10 NOTE — Progress Notes (Signed)
[facility]   Patient was admitted after midnight see For further details.  Susan Mccarthy is a 66 year old female with medical history of alcohol abuse, hypothyroid, metastatic melanoma who came to the ED after patient had a fall at a restaurant.  Upon ED evaluation x-ray showed left hip fracture.  While waiting in the ED patient had one episode of nausea and vomiting which was dark/bloody.  Patient was admitted for hip fracture repair and GI bleed workup.  Patient seen and chart reviewed.  Patient reports heavily drink every day.  Pain is well controlled at this time..  No more vomiting episode, she reported that this has happened in the past when she becomes very anxious.  No GI workup has been in the past.  Blood pressure (!) 142/72, pulse 65, temperature 98.3 F (36.8 C), temperature source Oral, resp. rate 16, last menstrual period 10/24/2011, SpO2 97 %.   A/P  Closed left hip fracture - status post fall Orthopedic surgery consulted planning surgical repair Since hemoglobin stable and no more hematemesis episode, also hemodynamically stable at this point patient is appropriate for surgical procedure.   Hematemesis - witnessed in the ED One episode, hemoglobin stable. GI has been consulted Continue Protonix drip  Alcohol abuse, monitor for alcohol withdrawal Last drink was yesterday prior to coming to the hospital Will start on Librium protocol.  Continues to CIWA PRN   Rest per H&P  Chipper Oman, MD

## 2017-10-10 NOTE — H&P (Addendum)
History and Physical    Susan Mccarthy DOB: February 22, 1951 DOA: 10/09/2017  PCP: Patient, No Pcp Per  Patient coming from: Home.  Chief Complaint: Fall.  Patient was not very forthcoming with history.  HPI: Susan Mccarthy is a 66 y.o. female with history of alcohol abuse, postoperative hypothyroid, history of metastatic melanoma was brought to the ER the patient had a fall at the restaurant patient was brought to eat.  Patient ordered some food and  while waiting patient fell down.  Patient states she has bad anxiety and that the reason she fell.  Denies any chest pain or shortness of breath.  EMS was called patient was found to be hypotensive was given 500 cc bolus and was brought to the ER.  ED Course: In the ER patient remained stable and was complaining of left-sided pain which x-ray was done which revealed left hip fracture.  While waiting in the ER patient also had one episode of nausea vomiting which was bloody.  Review of Systems: As per HPI, rest all negative.   Past Medical History:  Diagnosis Date  . Anxiety   . Arthritis   . Depression   . ETOH abuse   . History of chemotherapy    INTERFERON  . Insomnia   . Melanoma ([place]) 1980   RIGHT BACK  . Thyroid ca ([place])   . Thyroid disease    85 % BENIGN    Past Surgical History:  Procedure Laterality Date  . AXILLARY NODE DISSECTION  08/02/11   right axillary,  . Excision of melanoma  1980  . RASH     ON FACE AND HANDS S/P CHEMOTHERAPY  . THYROIDECTOMY  1/14   cancer     reports that she has been smoking cigarettes.  She started smoking about 43 years ago. She has a 1.25 pack-year smoking history. she has never used smokeless tobacco. She reports that she drinks alcohol. She reports that she does not use drugs.  Allergies  Allergen Reactions  . Other Rash and Other (See Comments)    Pt states that she is allergic to anything scented.  . Dust Mite Extract   . Grass Extracts [Gramineae Pollens]      Family History  Problem Relation Age of Onset  . ALS Father     Prior to Admission medications   Medication Sig Start Date End Date Taking? Authorizing Provider  cholecalciferol (VITAMIN D) 1000 units tablet Take 1,000 Units by mouth daily.    [provider]  hydrOXYzine (ATARAX/VISTARIL) 25 MG tablet Take 0.5 tablets (12.5 mg total) by mouth every 6 (six) hours as needed for anxiety. 08/25/17   Pisciotta, Elmyra Ricks, PA-C  levothyroxine (SYNTHROID, LEVOTHROID) 100 MCG tablet Take 1 tablet (100 mcg total) by mouth daily before breakfast. Patient not taking: Reported on 08/25/2017 07/31/17 08/30/17  Glyn Ade, PA-C  Multiple Vitamin (MULTIVITAMIN WITH MINERALS) TABS tablet Take 1 tablet by mouth daily. Patient not taking: Reported on 08/25/2017 06/19/17   Patrecia Pour, Christean Grief, MD  potassium chloride SA (K-DUR,KLOR-CON) 20 MEQ tablet Take 1 tablet (20 mEq total) by mouth 2 (two) times daily. Patient not taking: Reported on 08/25/2017 08/10/17   Sherwood Gambler, MD  thiamine (B-1) 100 MG/ML injection Inject 1 mL (100 mg total) into the vein daily. Patient not taking: Reported on 08/25/2017 06/19/17   Doreatha Lew, MD    Physical Exam: Vitals:   10/09/17 2130 10/09/17 2200 10/09/17 2230 10/09/17 2304  BP: 139/76 131/80  119/75 136/74  Pulse: (!) 57 60 66 61  Resp:   16   SpO2: 99% 96% 98% 97%      Constitutional: Moderately built and nourished. Vitals:   10/09/17 2130 10/09/17 2200 10/09/17 2230 10/09/17 2304  BP: 139/76 131/80 119/75 136/74  Pulse: (!) 57 60 66 61  Resp:   16   SpO2: 99% 96% 98% 97%   Eyes: Anicteric no pallor. ENMT: No discharge from the ears eyes normal. Neck: No mass felt.  No neck rigidity. Respiratory: No rhonchi or crepitations. Cardiovascular: S1-S2 heard no murmurs appreciated. Abdomen: Soft nontender bowel sounds present. Musculoskeletal: Pain on moving left side. Skin:  no rash. Neurologic: Alert awake oriented to time place and  person.  Moves all extremities. Psychiatric: Appears normal.  Normal affect.   Labs on Admission: I have personally reviewed following labs and imaging studies  CBC: Recent Labs  Lab 10/09/17 1929  WBC 3.1*  NEUTROABS 1.9  HGB 11.2*  HCT 32.0*  MCV 88.9  PLT 798   Basic Metabolic Panel: Recent Labs  Lab 10/09/17 1929  NA 131*  K 2.6*  CL 97*  CO2 20*  GLUCOSE 74  BUN 15  CREATININE 0.96  CALCIUM 8.0*   GFR: CrCl cannot be calculated (Unknown ideal weight.). Liver Function Tests: Recent Labs  Lab 10/09/17 1929  AST 56*  ALT 22  ALKPHOS 79  BILITOT 0.6  PROT 6.3*  ALBUMIN 3.6   No results for input(s): LIPASE, AMYLASE in the last 168 hours. No results for input(s): AMMONIA in the last 168 hours. Coagulation Profile: No results for input(s): INR, PROTIME in the last 168 hours. Cardiac Enzymes: No results for input(s): CKTOTAL, CKMB, CKMBINDEX, TROPONINI in the last 168 hours. BNP (last 3 results) No results for input(s): PROBNP in the last 8760 hours. HbA1C: No results for input(s): HGBA1C in the last 72 hours. CBG: No results for input(s): GLUCAP in the last 168 hours. Lipid Profile: No results for input(s): CHOL, HDL, LDLCALC, TRIG, CHOLHDL, LDLDIRECT in the last 72 hours. Thyroid Function Tests: No results for input(s): TSH, T4TOTAL, FREET4, T3FREE, THYROIDAB in the last 72 hours. Anemia Panel: No results for input(s): VITAMINB12, FOLATE, FERRITIN, TIBC, IRON, RETICCTPCT in the last 72 hours. Urine analysis:    Component Value Date/Time   COLORURINE YELLOW 06/30/2017 0403   APPEARANCEUR HAZY (A) 06/30/2017 0403   LABSPEC 1.018 06/30/2017 0403   PHURINE 6.0 06/30/2017 0403   GLUCOSEU NEGATIVE 06/30/2017 0403   GLUCOSEU NEGATIVE 08/22/2015 1654   HGBUR NEGATIVE 06/30/2017 0403   BILIRUBINUR NEGATIVE 06/30/2017 0403   KETONESUR NEGATIVE 06/30/2017 0403   PROTEINUR NEGATIVE 06/30/2017 0403   UROBILINOGEN 0.2 08/22/2015 1654   NITRITE POSITIVE (A)  06/30/2017 0403   LEUKOCYTESUR TRACE (A) 06/30/2017 0403   Sepsis Labs: @LABRCNTIP (procalcitonin:4,lacticidven:4) )No results found for this or any previous visit (from the past 240 hour(s)).   Radiological Exams on Admission: Dg Chest Portable 1 View  Result Date: 10/09/2017 CLINICAL DATA:  Preop, fractured hip EXAM: PORTABLE CHEST 1 VIEW COMPARISON:  08/10/2017 FINDINGS: Surgical clips in the right axilla. Old distal right clavicle deformity. No acute pulmonary infiltrate, consolidation or effusion. Cardiomediastinal silhouette probably within normal limits allowing for rotation which exaggerates the mediastinal width. No pneumothorax. Multiple old bilateral rib fractures. IMPRESSION: Rotated patient. No radiographic evidence for acute cardiopulmonary abnormality. Electronically Signed   By: Donavan Foil M.D.   On: 10/09/2017 23:41   Dg Hip Unilat W Or Wo Pelvis 2-3 Views Left  Result Date: 10/09/2017 CLINICAL DATA:  Left hip pain EXAM: DG HIP (WITH OR WITHOUT PELVIS) 2-3V LEFT COMPARISON:  None. FINDINGS: There are is a transverse fracture of the left femoral neck with associated overriding. The femoral head remains approximated within the acetabulum. No abnormal lucency surrounding the bilateral sacroiliac screws. IMPRESSION: Left femoral neck fracture without dislocation. Electronically Signed   By: Ulyses Jarred M.D.   On: 10/09/2017 22:56    EKG: Independently reviewed.  Normal sinus rhythm with poor progression.  Assessment/Plan Principal Problem:   Closed left hip fracture (HCC) Active Problems:   Malignant melanoma, metastatic (HCC)   Hypothyroidism, postop   Hypokalemia   Alcoholic intoxication without complication (HCC)   Hip fracture (HCC)    1. Closed left hip fracture -mechanism of her fall is unclear.  Dr. Percell Miller was consulted and has requested patient be transferred to [facility]. 2. Acute GI bleed -while waiting in the ER patient had one episode of  hematemesis.  Patient is placed on Protonix infusion.  Closely follow CBC.  Type and screen.  Will need GI consult in the morning. 3. History of postoperative hypothyroidism -patient states she has not taken her medications for long.  Check TSH. 4. Hypokalemia -replace and recheck again. 5. Alcohol intoxication -patient has been placed on CIWA protocol. 6. History of metastatic melanoma and thyroid cancer.  Patient declined CT head.  Was not forthcoming with history.   DVT prophylaxis: SCDs. Code Status: Full code. Family Communication: Discussed with patient. Disposition Plan: Home. Consults called: Gastroenterologist. Admission status: Patient   Rise Patience MD [facility] Pager [phone].  If 7PM-7AM, please contact night-coverage [email or web address] Password [email or web address]  10/10/2017, 12:01 AM

## 2017-10-10 NOTE — Progress Notes (Signed)
Initial Nutrition Assessment  DOCUMENTATION CODES:   Non-severe (moderate) malnutrition in context of chronic illness  INTERVENTION:    Advance diet as medically appropriate. Add Ensure Enlive supplement when/as able.  NUTRITION DIAGNOSIS:   Moderate Malnutrition related to chronic illness(alcoholism) as evidenced by moderate fat depletion, moderate muscle depletion  GOAL:   Patient will meet greater than or equal to 90% of their needs  MONITOR:   Diet advancement, PO intake, Supplement acceptance, Labs, Weight trends, Skin  REASON FOR ASSESSMENT:   Consult Hip fracture protocol  ASSESSMENT:   66 yo Female with medical history of alcohol abuse, hypothyroid, metastatic melanoma who came to the ED after patient had a fall at a restaurant.  Upon ED evaluation x-ray showed left hip fracture.  RD spoke with patient at bedside. Reports her appetite is variable due to anxiety. Does typically consume 3 meals per day. Believes she's lost weight, however, unable to identify amount or time frame.  Pt is currently NPO for surgery. Medications reviewed: MVI, folvite and thiamine. Labs reviewed. Na 134 (L). K 3.0 (L).  NUTRITION - FOCUSED PHYSICAL EXAM:    Most Recent Value  Orbital Region  Mild depletion  Upper Arm Region  Moderate depletion  Thoracic and Lumbar Region  Moderate depletion  Buccal Region  Mild depletion  Temple Region  Moderate depletion  Clavicle Bone Region  Moderate depletion  Clavicle and Acromion Bone Region  Moderate depletion  Scapular Bone Region  Unable to assess  Dorsal Hand  Unable to assess  Patellar Region  Moderate depletion  Anterior Thigh Region  Moderate depletion  Posterior Calf Region  Moderate depletion  Edema (RD Assessment)  None     Diet Order:  Diet NPO time specified Except for: Sips with Meds Diet clear liquid Room service appropriate? Yes; Fluid consistency: Thin  EDUCATION NEEDS:   No education needs have been identified at  this time  Skin:  Skin Assessment: Reviewed RN Assessment  Last BM:  11/6  Height:   Ht Readings from Last 1 Encounters:  10/10/17 5\' 4"  (1.626 m)   Weight:   Wt Readings from Last 1 Encounters:  10/10/17 120 lb (54.4 kg)   Ideal Body Weight:     BMI:  Body mass index is 20.6 kg/m.  Estimated Nutritional Needs:   Kcal:  1600-1800  Protein:  70-85 gm  Fluid:  1.6-1.8 L  Arthur Holms, RD, LDN Pager #: [phone] After-Hours Pager #: [phone]

## 2017-10-10 NOTE — ED Notes (Signed)
CT tech in to get patient for CT, pt refusing at this time. Also, told patient that I had potassium for her and she is currently refusing to take it at this time.
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
## 2017-10-10 NOTE — ED Notes (Signed)
admitting Provider at bedside. 
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
## 2017-10-11 ENCOUNTER — Inpatient Hospital Stay (HOSPITAL_COMMUNITY): Payer: BLUE CROSS/BLUE SHIELD

## 2017-10-11 ENCOUNTER — Inpatient Hospital Stay (HOSPITAL_COMMUNITY): Payer: BLUE CROSS/BLUE SHIELD | Admitting: Anesthesiology

## 2017-10-11 ENCOUNTER — Encounter (HOSPITAL_COMMUNITY)
Admission: EM | Disposition: A | Discharge status: Skilled Nursing Facility | Payer: Self-pay | Source: Home / Self Care | Attending: Internal Medicine

## 2017-10-11 ENCOUNTER — Encounter (HOSPITAL_COMMUNITY): Payer: Self-pay | Admitting: Anesthesiology

## 2017-10-11 DIAGNOSIS — F10920 Alcohol use, unspecified with intoxication, uncomplicated: Secondary | ICD-10-CM | POA: Diagnosis not present

## 2017-10-11 DIAGNOSIS — E876 Hypokalemia: Secondary | ICD-10-CM | POA: Diagnosis not present

## 2017-10-11 DIAGNOSIS — S72002A Fracture of unspecified part of neck of left femur, initial encounter for closed fracture: Secondary | ICD-10-CM | POA: Diagnosis not present

## 2017-10-11 HISTORY — PX: HIP ARTHROPLASTY: SHX981

## 2017-10-11 LAB — MAGNESIUM: MAGNESIUM: 1.2 mg/dL — AB (ref 1.7–2.4)

## 2017-10-11 SURGERY — HEMIARTHROPLASTY, HIP, DIRECT ANTERIOR APPROACH, FOR FRACTURE
Anesthesia: Spinal | Laterality: Left

## 2017-10-11 MED ORDER — PHENYLEPHRINE 40 MCG/ML (10ML) SYRINGE FOR IV PUSH (FOR BLOOD PRESSURE SUPPORT)
PREFILLED_SYRINGE | INTRAVENOUS | Status: AC
  Filled 2017-10-11: qty 10

## 2017-10-11 MED ORDER — BACLOFEN 10 MG PO TABS: 10.0000 mg | ORAL_TABLET | Freq: Three times a day (TID) | ORAL | 0 refills | Status: DC | PRN

## 2017-10-11 MED ORDER — METHOCARBAMOL 1000 MG/10ML IJ SOLN
500.0000 mg | Freq: Four times a day (QID) | INTRAVENOUS | Status: DC | PRN
  Filled 2017-10-11: qty 5

## 2017-10-11 MED ORDER — ONDANSETRON HCL 4 MG/2ML IJ SOLN
INTRAMUSCULAR | Status: DC | PRN
  Administered 2017-10-11: 4 mg via INTRAVENOUS

## 2017-10-11 MED ORDER — CHLORHEXIDINE GLUCONATE CLOTH 2 % EX PADS
6.0000 | MEDICATED_PAD | Freq: Every day | CUTANEOUS | Status: DC
  Administered 2017-10-14: 6 via TOPICAL

## 2017-10-11 MED ORDER — MAGNESIUM CITRATE PO SOLN: 1.0000 | Freq: Once | ORAL | Status: DC | PRN

## 2017-10-11 MED ORDER — HYDROCODONE-ACETAMINOPHEN 5-325 MG PO TABS
1.0000 | ORAL_TABLET | ORAL | Status: DC | PRN
  Administered 2017-10-11: 1 via ORAL
  Administered 2017-10-12 – 2017-10-13 (×5): 2 via ORAL
  Administered 2017-10-13: 1 via ORAL
  Administered 2017-10-13 – 2017-10-15 (×8): 2 via ORAL
  Administered 2017-10-16: 1 via ORAL
  Administered 2017-10-16 – 2017-10-17 (×5): 2 via ORAL
  Filled 2017-10-11 (×6): qty 2
  Filled 2017-10-11: qty 1
  Filled 2017-10-11 (×13): qty 2
  Filled 2017-10-11: qty 1

## 2017-10-11 MED ORDER — DOCUSATE SODIUM 100 MG PO CAPS
100.0000 mg | ORAL_CAPSULE | Freq: Two times a day (BID) | ORAL | Status: DC
  Administered 2017-10-11 – 2017-10-17 (×12): 100 mg via ORAL
  Filled 2017-10-11 (×12): qty 1

## 2017-10-11 MED ORDER — METHOCARBAMOL 500 MG PO TABS
500.0000 mg | ORAL_TABLET | Freq: Four times a day (QID) | ORAL | Status: DC | PRN
  Administered 2017-10-11 – 2017-10-17 (×6): 500 mg via ORAL
  Filled 2017-10-11 (×6): qty 1

## 2017-10-11 MED ORDER — PHENOL 1.4 % MT LIQD: 1.0000 | OROMUCOSAL | Status: DC | PRN

## 2017-10-11 MED ORDER — PROPOFOL 500 MG/50ML IV EMUL
INTRAVENOUS | Status: DC | PRN
  Administered 2017-10-11: 25 ug/kg/min via INTRAVENOUS

## 2017-10-11 MED ORDER — MIDAZOLAM HCL 2 MG/2ML IJ SOLN
INTRAMUSCULAR | Status: AC
  Filled 2017-10-11: qty 2

## 2017-10-11 MED ORDER — PROPOFOL 10 MG/ML IV BOLUS
INTRAVENOUS | Status: DC | PRN
  Administered 2017-10-11: 20 mg via INTRAVENOUS

## 2017-10-11 MED ORDER — ENOXAPARIN SODIUM 40 MG/0.4ML ~~LOC~~ SOLN
40.0000 mg | SUBCUTANEOUS | Status: DC
  Administered 2017-10-12 – 2017-10-17 (×6): 40 mg via SUBCUTANEOUS
  Filled 2017-10-11 (×6): qty 0.4

## 2017-10-11 MED ORDER — ONDANSETRON HCL 4 MG/2ML IJ SOLN
INTRAMUSCULAR | Status: AC
  Filled 2017-10-11: qty 2

## 2017-10-11 MED ORDER — FENTANYL CITRATE (PF) 100 MCG/2ML IJ SOLN
INTRAMUSCULAR | Status: DC | PRN
  Administered 2017-10-11: 50 ug via INTRAVENOUS

## 2017-10-11 MED ORDER — PHENYLEPHRINE HCL 10 MG/ML IJ SOLN
INTRAMUSCULAR | Status: DC | PRN
  Administered 2017-10-11 (×5): 80 ug via INTRAVENOUS

## 2017-10-11 MED ORDER — BISACODYL 10 MG RE SUPP: 10.0000 mg | Freq: Every day | RECTAL | Status: DC | PRN

## 2017-10-11 MED ORDER — OXYCODONE HCL 5 MG/5ML PO SOLN: 5.0000 mg | Freq: Once | ORAL | Status: DC | PRN

## 2017-10-11 MED ORDER — CEFAZOLIN SODIUM-DEXTROSE 2-4 GM/100ML-% IV SOLN
2.0000 g | Freq: Four times a day (QID) | INTRAVENOUS | Status: AC
  Administered 2017-10-11 – 2017-10-12 (×2): 2 g via INTRAVENOUS
  Filled 2017-10-11 (×2): qty 100

## 2017-10-11 MED ORDER — MIDAZOLAM HCL 5 MG/5ML IJ SOLN
INTRAMUSCULAR | Status: DC | PRN
  Administered 2017-10-11: 2 mg via INTRAVENOUS

## 2017-10-11 MED ORDER — LACTATED RINGERS IV SOLN
INTRAVENOUS | Status: DC
  Administered 2017-10-11: 18:00:00 via INTRAVENOUS

## 2017-10-11 MED ORDER — LIDOCAINE 2% (20 MG/ML) 5 ML SYRINGE
INTRAMUSCULAR | Status: AC
  Filled 2017-10-11: qty 5

## 2017-10-11 MED ORDER — LEVOTHYROXINE SODIUM 100 MCG PO TABS: 100.0000 ug | ORAL_TABLET | Freq: Every day | ORAL | Status: DC

## 2017-10-11 MED ORDER — DEXAMETHASONE SODIUM PHOSPHATE 10 MG/ML IJ SOLN
INTRAMUSCULAR | Status: AC
  Filled 2017-10-11: qty 1

## 2017-10-11 MED ORDER — SENNA 8.6 MG PO TABS
1.0000 | ORAL_TABLET | Freq: Two times a day (BID) | ORAL | Status: DC
  Administered 2017-10-11 – 2017-10-17 (×12): 8.6 mg via ORAL
  Filled 2017-10-11 (×12): qty 1

## 2017-10-11 MED ORDER — ONDANSETRON HCL 4 MG/2ML IJ SOLN: 4.0000 mg | Freq: Once | INTRAMUSCULAR | Status: DC | PRN

## 2017-10-11 MED ORDER — EPHEDRINE 5 MG/ML INJ
INTRAVENOUS | Status: AC
  Filled 2017-10-11: qty 10

## 2017-10-11 MED ORDER — PANTOPRAZOLE SODIUM 40 MG PO TBEC
40.0000 mg | DELAYED_RELEASE_TABLET | Freq: Two times a day (BID) | ORAL | Status: DC
  Administered 2017-10-11 – 2017-10-17 (×12): 40 mg via ORAL
  Filled 2017-10-11 (×12): qty 1

## 2017-10-11 MED ORDER — 0.9 % SODIUM CHLORIDE (POUR BTL) OPTIME
TOPICAL | Status: DC | PRN
  Administered 2017-10-11: 1000 mL

## 2017-10-11 MED ORDER — FENTANYL CITRATE (PF) 250 MCG/5ML IJ SOLN
INTRAMUSCULAR | Status: AC
  Filled 2017-10-11: qty 5

## 2017-10-11 MED ORDER — EPHEDRINE SULFATE 50 MG/ML IJ SOLN
INTRAMUSCULAR | Status: DC | PRN
  Administered 2017-10-11 (×2): 5 mg via INTRAVENOUS

## 2017-10-11 MED ORDER — OXYCODONE HCL 5 MG PO TABS: 5.0000 mg | ORAL_TABLET | Freq: Once | ORAL | Status: DC | PRN

## 2017-10-11 MED ORDER — PHENYLEPHRINE HCL 10 MG/ML IJ SOLN
INTRAVENOUS | Status: DC | PRN
  Administered 2017-10-11: 25 ug/min via INTRAVENOUS

## 2017-10-11 MED ORDER — TRANEXAMIC ACID 1000 MG/10ML IV SOLN
1000.0000 mg | INTRAVENOUS | Status: AC
  Administered 2017-10-11: 1000 mg via INTRAVENOUS
  Filled 2017-10-11: qty 10

## 2017-10-11 MED ORDER — ENOXAPARIN SODIUM 40 MG/0.4ML ~~LOC~~ SOLN: 40.0000 mg | SUBCUTANEOUS | 0 refills | Status: AC

## 2017-10-11 MED ORDER — MUPIROCIN 2 % EX OINT
1.0000 "application " | TOPICAL_OINTMENT | Freq: Two times a day (BID) | CUTANEOUS | Status: AC
  Administered 2017-10-11 – 2017-10-15 (×10): 1 via NASAL
  Filled 2017-10-11 (×4): qty 22

## 2017-10-11 MED ORDER — DEXAMETHASONE SODIUM PHOSPHATE 4 MG/ML IJ SOLN
INTRAMUSCULAR | Status: DC | PRN
  Administered 2017-10-11: 4 mg via INTRAVENOUS

## 2017-10-11 MED ORDER — LORAZEPAM 2 MG/ML IJ SOLN
INTRAMUSCULAR | Status: AC
Start: 2017-10-11 — End: 2017-10-12
  Filled 2017-10-11: qty 1

## 2017-10-11 MED ORDER — POLYETHYLENE GLYCOL 3350 17 G PO PACK: 17.0000 g | PACK | Freq: Every day | ORAL | Status: DC | PRN

## 2017-10-11 MED ORDER — MENTHOL 3 MG MT LOZG: 1.0000 | LOZENGE | OROMUCOSAL | Status: DC | PRN

## 2017-10-11 MED ORDER — CEFAZOLIN SODIUM-DEXTROSE 2-3 GM-%(50ML) IV SOLR
INTRAVENOUS | Status: DC | PRN
  Administered 2017-10-11: 2 g via INTRAVENOUS

## 2017-10-11 MED ORDER — FENTANYL CITRATE (PF) 100 MCG/2ML IJ SOLN: 25.0000 ug | INTRAMUSCULAR | Status: DC | PRN

## 2017-10-11 MED ORDER — LEVOTHYROXINE SODIUM 75 MCG PO TABS
150.0000 ug | ORAL_TABLET | Freq: Every day | ORAL | Status: DC
  Administered 2017-10-12 – 2017-10-17 (×6): 150 ug via ORAL
  Filled 2017-10-11 (×6): qty 2

## 2017-10-11 MED ORDER — CEFAZOLIN SODIUM-DEXTROSE 2-4 GM/100ML-% IV SOLN
INTRAVENOUS | Status: AC
  Filled 2017-10-11: qty 100

## 2017-10-11 MED ORDER — POTASSIUM CHLORIDE 10 MEQ/100ML IV SOLN
10.0000 meq | INTRAVENOUS | Status: AC
  Administered 2017-10-11 (×3): 10 meq via INTRAVENOUS
  Filled 2017-10-11 (×3): qty 100

## 2017-10-11 MED ORDER — HYDROCODONE-ACETAMINOPHEN 5-325 MG PO TABS: 1.0000 | ORAL_TABLET | ORAL | 0 refills | Status: AC | PRN

## 2017-10-11 MED ORDER — PROPOFOL 10 MG/ML IV BOLUS
INTRAVENOUS | Status: AC
  Filled 2017-10-11: qty 20

## 2017-10-11 MED ORDER — KETOROLAC TROMETHAMINE 30 MG/ML IJ SOLN
INTRAMUSCULAR | Status: AC
  Filled 2017-10-11: qty 1

## 2017-10-11 MED ORDER — BUPIVACAINE HCL (PF) 0.25 % IJ SOLN
INTRAMUSCULAR | Status: AC
  Filled 2017-10-11: qty 30

## 2017-10-11 SURGICAL SUPPLY — 42 items
BIT DRILL 7/64X5 DISP (BIT) ×3 IMPLANT
BLADE SAGITTAL 25.0X1.27X90 (BLADE) ×2 IMPLANT
BLADE SAGITTAL 25.0X1.27X90MM (BLADE) ×1
CAPT HIP HEMI 2 ×3 IMPLANT
CLOSURE STERI-STRIP 1/2X4 (GAUZE/BANDAGES/DRESSINGS) ×1
CLSR STERI-STRIP ANTIMIC 1/2X4 (GAUZE/BANDAGES/DRESSINGS) ×3 IMPLANT
COVER SURGICAL LIGHT HANDLE (MISCELLANEOUS) ×3 IMPLANT
DRAPE ORTHO SPLIT 77X108 STRL (DRAPES) ×6
DRAPE SURG ORHT 6 SPLT 77X108 (DRAPES) ×2 IMPLANT
DRAPE U-SHAPE 47X51 STRL (DRAPES) ×3 IMPLANT
DRSG MEPILEX BORDER 4X12 (GAUZE/BANDAGES/DRESSINGS) ×2 IMPLANT
DRSG MEPILEX BORDER 4X8 (GAUZE/BANDAGES/DRESSINGS) ×1 IMPLANT
DURAPREP 26ML APPLICATOR (WOUND CARE) ×3 IMPLANT
ELECT CAUTERY BLADE 6.4 (BLADE) ×3 IMPLANT
ELECT REM PT RETURN 9FT ADLT (ELECTROSURGICAL) ×3
ELECTRODE REM PT RTRN 9FT ADLT (ELECTROSURGICAL) ×1 IMPLANT
FACESHIELD WRAPAROUND (MASK) ×3 IMPLANT
FACESHIELD WRAPAROUND OR TEAM (MASK) ×1 IMPLANT
GLOVE BIO SURGEON STRL SZ7.5 (GLOVE) ×6 IMPLANT
GLOVE BIOGEL PI IND STRL 8 (GLOVE) ×2 IMPLANT
GLOVE BIOGEL PI INDICATOR 8 (GLOVE) ×4
GOWN STRL REUS W/ TWL LRG LVL3 (GOWN DISPOSABLE) ×2 IMPLANT
GOWN STRL REUS W/ TWL XL LVL3 (GOWN DISPOSABLE) ×1 IMPLANT
GOWN STRL REUS W/TWL LRG LVL3 (GOWN DISPOSABLE) ×6
GOWN STRL REUS W/TWL XL LVL3 (GOWN DISPOSABLE) ×3
HIP CAPITATED HEMI 2 IMPLANT
KIT BASIN OR (CUSTOM PROCEDURE TRAY) ×3 IMPLANT
KIT ROOM TURNOVER OR (KITS) ×3 IMPLANT
MANIFOLD NEPTUNE II (INSTRUMENTS) ×3 IMPLANT
NS IRRIG 1000ML POUR BTL (IV SOLUTION) ×3 IMPLANT
PACK TOTAL JOINT (CUSTOM PROCEDURE TRAY) ×3 IMPLANT
PAD ARMBOARD 7.5X6 YLW CONV (MISCELLANEOUS) ×6 IMPLANT
PILLOW ABDUCTION HIP (SOFTGOODS) ×3 IMPLANT
RETRIEVER SUT HEWSON (MISCELLANEOUS) ×3 IMPLANT
SUT FIBERWIRE #2 38 REV NDL BL (SUTURE) ×6
SUT MNCRL AB 4-0 PS2 18 (SUTURE) ×3 IMPLANT
SUT MON AB 2-0 CT1 36 (SUTURE) ×3 IMPLANT
SUT VIC AB 1 CT1 27 (SUTURE) ×3
SUT VIC AB 1 CT1 27XBRD ANBCTR (SUTURE) ×1 IMPLANT
SUT VLOC 180 0 24IN GS25 (SUTURE) ×2 IMPLANT
SUTURE FIBERWR#2 38 REV NDL BL (SUTURE) ×2 IMPLANT
TRAY FOLEY CATH SILVER 14FR (SET/KITS/TRAYS/PACK) IMPLANT

## 2017-10-11 NOTE — Anesthesia Preprocedure Evaluation (Addendum)
Anesthesia Evaluation  Patient identified by MRN, date of birth, ID band Patient awake    Reviewed: Allergy & Precautions, NPO status , Patient's Chart, lab work & pertinent test results  Airway Mallampati: II  TM Distance: >3 FB Neck ROM: Full    Dental  (+) Teeth Intact, Dental Advisory Given   Pulmonary Current Smoker,    breath sounds clear to auscultation       Cardiovascular  Rhythm:Regular Rate:Normal     Neuro/Psych    GI/Hepatic (+)     substance abuse  alcohol use,   Endo/Other    Renal/GU      Musculoskeletal   Abdominal   Peds  Hematology   Anesthesia Other Findings   Reproductive/Obstetrics                            Anesthesia Physical Anesthesia Plan  ASA: III  Anesthesia Plan: Spinal   Post-op Pain Management:    Induction: Intravenous  PONV Risk Score and Plan: Ondansetron and Dexamethasone  Airway Management Planned: Natural Airway and Simple Face Mask  Additional Equipment:   Intra-op Plan:   Post-operative Plan:   Informed Consent: I have reviewed the patients History and Physical, chart, labs and discussed the procedure including the risks, benefits and alternatives for the proposed anesthesia with the patient or authorized representative who has indicated his/her understanding and acceptance.   Dental advisory given  Plan Discussed with: CRNA and Anesthesiologist  Anesthesia Plan Comments:         Anesthesia Quick Evaluation

## 2017-10-11 NOTE — Anesthesia Postprocedure Evaluation (Signed)
Anesthesia Post Note  Patient: SHAROLYN WEBER  Procedure(s) Performed: HEMIARTHROPLASTY LEFT HIP (Left )     Patient location during evaluation: PACU Anesthesia Type: Spinal Level of consciousness: awake and awake and alert Pain management: pain level controlled Vital Signs Assessment: post-procedure vital signs reviewed and stable Respiratory status: spontaneous breathing, nonlabored ventilation and respiratory function stable Cardiovascular status: blood pressure returned to baseline Postop Assessment: spinal receding Anesthetic complications: no    Last Vitals:  Vitals:   10/11/17 1512 10/11/17 1515  BP: 104/69   Pulse: (!) 57 (!) 55  Resp: 10 11  Temp:    SpO2: 93% 94%    Last Pain:  Vitals:   10/11/17 1512  TempSrc:   PainSc: Asleep    LLE Motor Response: No movement due to regional block (10/11/17 1512) LLE Sensation: Decreased;Numbness (10/11/17 1512)     L Sensory Level: L2-Upper inner thigh, upper buttock (10/11/17 1512) R Sensory Level: L2-Upper inner thigh, upper buttock (10/11/17 1512)  Zariel Capano COKER

## 2017-10-11 NOTE — Transfer of Care (Signed)
Immediate Anesthesia Transfer of Care Note  Patient: Susan Mccarthy  Procedure(s) Performed: HEMIARTHROPLASTY LEFT HIP (Left )  Patient Location: PACU  Anesthesia Type:MAC and Spinal  Level of Consciousness: awake and patient cooperative  Airway & Oxygen Therapy: Patient Spontanous Breathing and Patient connected to nasal cannula oxygen  Post-op Assessment: Report given to RN and Post -op Vital signs reviewed and stable  Post vital signs: Reviewed  Last Vitals:  Vitals:   10/10/17 1938 10/11/17 0419  BP: 137/80 140/75  Pulse: 69 65  Resp: 14 16  Temp: 36.6 C 36.8 C  SpO2: 95% 94%    Last Pain:  Vitals:   10/11/17 1102  TempSrc:   PainSc: 8       Patients Stated Pain Goal: 3 (67/12/45 8099)  Complications: No apparent anesthesia complications

## 2017-10-11 NOTE — Anesthesia Procedure Notes (Signed)
Spinal  Patient location during procedure: OR Start time: 10/11/2017 12:50 PM End time: 10/11/2017 12:55 PM Staffing Anesthesiologist: Roberts Gaudy, MD Performed: anesthesiologist  Preanesthetic Checklist Completed: patient identified, site marked, surgical consent, pre-op evaluation, timeout performed, IV checked, risks and benefits discussed and monitors and equipment checked Spinal Block Patient position: left lateral decubitus Prep: Betadine Patient monitoring: heart rate, cardiac monitor, continuous pulse ox and blood pressure Approach: right paramedian Location: L3-4 Injection technique: single-shot Needle Needle type: Tuohy  Needle gauge: 22 G Needle length: 5 cm Assessment Sensory level: T6 Additional Notes 12 mg 0.75% Bupivacaine injected easily

## 2017-10-11 NOTE — Progress Notes (Signed)
Subjective: Hip repair this afternoon. Denies recurrent hematemesis, abdominal pain or melena.  Objective: Vital signs in last 24 hours: Temp:  [97.9 F (36.6 C)-98.9 F (37.2 C)] 98.3 F (36.8 C) (11/09 0419) Pulse Rate:  [65-91] 65 (11/09 0419) Resp:  [14-18] 16 (11/09 0419) BP: (116-140)/(75-99) 140/75 (11/09 0419) SpO2:  [93 %-95 %] 94 % (11/09 0419) Weight:  [120 lb (54.4 kg)] 120 lb (54.4 kg) (11/08 1123) Weight change:  Last BM Date: 10/08/17  PE: GEN:  NAD  Lab Results: CBC    Component Value Date/Time   WBC 5.3 10/10/2017 1050   RBC 3.72 (L) 10/10/2017 1050   HGB 11.6 (L) 10/10/2017 1050   HGB 13.3 12/29/2015 1110   HCT 33.7 (L) 10/10/2017 1050   HCT 39.0 12/29/2015 1110   PLT 160 10/10/2017 1050   PLT 273 12/29/2015 1110   MCV 90.6 10/10/2017 1050   MCV 83 12/29/2015 1110   MCH 31.2 10/10/2017 1050   MCHC 34.4 10/10/2017 1050   RDW 16.4 (H) 10/10/2017 1050   RDW 13.6 12/29/2015 1110   LYMPHSABS 0.6 (L) 10/10/2017 0732   LYMPHSABS 1.1 12/29/2015 1110   MONOABS 0.3 10/10/2017 0732   EOSABS 0.0 10/10/2017 0732   EOSABS 0.1 12/29/2015 1110   BASOSABS 0.0 10/10/2017 0732   BASOSABS 0.0 12/29/2015 1110   CMP     Component Value Date/Time   NA 134 (L) 10/10/2017 0732   NA 131 (L) 12/29/2015 1108   K 3.0 (L) 10/10/2017 0732   K 5.3 (H) 12/29/2015 1108   CL 102 10/10/2017 0732   CL 96 (L) 04/11/2015 1437   CO2 22 10/10/2017 0732   CO2 26 12/29/2015 1108   GLUCOSE 70 10/10/2017 0732   GLUCOSE 98 12/29/2015 1108   GLUCOSE 98 04/11/2015 1437   BUN 11 10/10/2017 0732   BUN 14.0 12/29/2015 1108   CREATININE 0.92 10/10/2017 0732   CREATININE 0.8 12/29/2015 1108   CALCIUM 8.0 (L) 10/10/2017 0732   CALCIUM 9.1 12/29/2015 1108   PROT 6.0 (L) 10/10/2017 0732   PROT 6.6 12/29/2015 1108   ALBUMIN 3.7 10/10/2017 0732   ALBUMIN 3.7 12/29/2015 1108   AST 53 (H) 10/10/2017 0732   AST 27 12/29/2015 1108   ALT 22 10/10/2017 0732   ALT 23 12/29/2015 1108   ALKPHOS 81 10/10/2017 0732   ALKPHOS 136 12/29/2015 1108   BILITOT 0.9 10/10/2017 0732   BILITOT 0.45 12/29/2015 1108   GFRNONAA >60 10/10/2017 0732   GFRAA >60 10/10/2017 0732   Assessment:  1.  Left hip fracture. 2.  Hematemesis.  Patient relays that this has been ongoing problem for nearly one year. Quiescent since hospitalization. 3.  Anemia, mild. 4.  Alcohol abuse. 5.  Metastatic melanoma.  Plan:  1.  Hip fracture surgery this afternoon planned per patient. 2.  PPI. 3.  Post-op, ok to advance diet as tolerated from GI perspective. 4.  [facility] will revisit Monday.   Landry Dyke 10/11/2017, 10:13 AM   Cell [phone] If no answer or after 5 PM call [phone]

## 2017-10-11 NOTE — Interval H&P Note (Signed)
History and Physical Interval Note:  10/11/2017 12:30 PM  Susan Mccarthy  has presented today for surgery, with the diagnosis of left hip fracture  The various methods of treatment have been discussed with the patient and family. After consideration of risks, benefits and other options for treatment, the patient has consented to  Procedure(s): HEMIARTHROPLASTY LEFT HIP (Left) as a surgical intervention .  The patient's history has been reviewed, patient examined, no change in status, stable for surgery.  I have reviewed the patient's chart and labs.  Questions were answered to the patient's satisfaction.     Aithan Farrelly D

## 2017-10-11 NOTE — Progress Notes (Signed)
Pt taken down to OR. Report called and given to short stay. Tele called and taken off.

## 2017-10-11 NOTE — Op Note (Signed)
10/09/2017 - 10/11/2017  2:07 PM  PATIENT:  Susan Mccarthy   MRN: [id number]  PRE-OPERATIVE DIAGNOSIS:  left hip fracture  POST-OPERATIVE DIAGNOSIS:  left hip fracture  PROCEDURE:  Procedure(s): HEMIARTHROPLASTY LEFT HIP  PREOPERATIVE INDICATIONS:  Susan Mccarthy is an 66 y.o. female who was admitted 10/09/2017 with a diagnosis of Closed left hip fracture ([place]) and elected for surgical management.  The risks benefits and alternatives were discussed with the patient including but not limited to the risks of nonoperative treatment, versus surgical intervention including infection, bleeding, nerve injury, periprosthetic fracture, the need for revision surgery, dislocation, leg length discrepancy, blood clots, cardiopulmonary complications, morbidity, mortality, among others, and they were willing to proceed.  Predicted outcome is good, although there will be at least a six to nine month expected recovery.   OPERATIVE REPORT     SURGEON:  Edmonia Lynch, MD    ASSISTANT:  Roxan Hockey, PA-C, he was present and scrubbed throughout the case, critical for completion in a timely fashion, and for retraction, instrumentation, and closure.     ANESTHESIA:  General    COMPLICATIONS:  None.      COMPONENTS:  [employer]: Femoral stem: 7, Femoral Head:43, Neck:0   PROCEDURE IN DETAIL: The patient was met in the holding area and identified.  The appropriate hip  was marked at the operative site. The patient was then transported to the OR and  placed under general anesthesia.  At that point, the patient was  placed in the lateral decubitus position with the operative side up and  secured to the operating room table and all bony prominences padded.     The operative lower extremity was prepped from the iliac crest to the toes.  Sterile draping was performed.  Time out was performed prior to incision.      A routine posterolateral approach was utilized via sharp dissection  carried down to the  subcutaneous tissue.  Gross bleeders were Bovie  coagulated.  The iliotibial band was identified and incised  along the length of the skin incision.  Self-retaining retractors were  inserted. I examined the bursa there was significant hematoma and edema I performed a bursectomy here.  With the hip internally rotated, the short external rotators  were identified. The piriformis was tagged with FiberWire, and the hip capsule released in a T-type fashion.  The femoral neck was exposed, and I resected the femoral neck using the appropriate jig. This was performed at approximately a thumb's breadth above the lesser trochanter.    I then exposed the deep acetabulum, cleared out any tissue including the ligamentum teres, and included the hip capsule in the FiberWire used above and below the T.    I then prepared the proximal femur using the cookie-cutter, the lateralizing reamer, and then sequentially broached.  A trial utilized, and I reduced the hip and it was found to have excellent stability with functional range of motion. The trial components were then removed.   The canal and acetabulum were thoroughly irrigated  I inserted the pressfit stem and placed the head and neck collar. The hip was reduced with appropriate force and was stable through a range of motion.   I then used a 2 mm drill bits to pass the FiberWire suture from the capsule and puriform is through the greater trochanter, and secured this. Excellent posterior capsular repair was achieved. I also closed the T in the capsule.  I then irrigated the hip copiously again with pulse  lavage, and repaired the fascia with Vicryl, followed by Vicryl for the subcutaneous tissue, Monocryl for the skin, Steri-Strips and sterile gauze. The wounds were injected. The patient was then awakened and returned to PACU in stable and satisfactory condition. There were no complications.  POST-OP PLAN: Weight bearing as tolerated. DVT px will consist of SCD's  and chemical px  Edmonia Lynch, MD Orthopedic Surgeon [phone]   10/11/2017 2:07 PM

## 2017-10-11 NOTE — Progress Notes (Addendum)
PROGRESS NOTE [facility]   Susan Mccarthy   PXT:[id number] DOB: Sep 25, 1951  DOA: 10/09/2017 PCP: Patient, No Pcp Per   Brief Narrative:  Susan Mccarthy is a 66 year old female with medical history of alcohol abuse, hypothyroid, metastatic melanoma who came to the ED after patient had a fall at a restaurant.  Upon ED evaluation x-ray showed left hip fracture.  While waiting in the ED patient had one episode of nausea and vomiting which was dark/bloody.  Patient was admitted for hip fracture repair and GI bleed workup. Patient underwent left hip hemiarthroplasty.   Subjective: Patient seen and examined doing well after surgery, able to move all extremities with no issues.   Assessment & Plan: Closed left hip fracture - status post fall S/p left hip hemiarthroplasty.  Continue management per ortho   Hematemesis - witnessed in the ED One episode, hemoglobin stable. GI recommendations appreciated - may need EGD as outpatient Change protonix ggt to PO   Alcohol abuse, monitor for alcohol withdrawal Last drink was yesterday prior to coming to the hospital Patient on librium taper and CIWA PRN   Hypokalemia  Replete  Check mag Monitor in AM   Hypothyroidism  Significantly elevated TSH  Will increase synthroid to 150 mcg  Check free T4  Follow up TSH in 6 weeks   Hx of metastatic melanoma and thyroid cancer   DVT prophylaxis: SCD's  Code Status: Full code  Family Communication: None at bedside  Disposition Plan: TBD, like SNF   Consultants:   Orthopedic surgery   GI   Procedures:   Left hip hemiarthroplasty 11/9  Antimicrobials: Anti-infectives (From admission, onward)   Start     Dose/Rate Route Frequency Ordered Stop   10/11/17 1233  ceFAZolin (ANCEF) 2-4 GM/100ML-% IVPB    Comments:  Merryl Hacker   : cabinet override      10/11/17 1233 10/12/17 0044   10/10/17 0730  ceFAZolin (ANCEF) IVPB 2g/100 mL premix     2 g 200 mL/hr over 30 Minutes  Intravenous On call to O.R. 10/10/17 0716 10/11/17 0559       Objective: Vitals:   10/10/17 1243 10/10/17 1530 10/10/17 1938 10/11/17 0419  BP: (!) 127/95 116/80 137/80 140/75  Pulse: 91 74 69 65  Resp: 18 16 14 16   Temp: 98.5 F (36.9 C) 98.9 F (37.2 C) 97.9 F (36.6 C) 98.3 F (36.8 C)  TempSrc: Oral Oral Axillary Oral  SpO2: 94% 93% 95% 94%  Weight:      Height:        Intake/Output Summary (Last 24 hours) at 10/11/2017 1448 Last data filed at 10/11/2017 1408 Gross per 24 hour  Intake 1693.33 ml  Output 750 ml  Net 943.33 ml   Filed Weights   10/10/17 1123  Weight: 54.4 kg (120 lb)    Examination:  General: NAD  Cardiovascular: RRR, S1/S2 +, no rubs, no gallops Respiratory: CTA bilaterally, no wheezing, no rhonchi Abdominal: Soft, NT, ND, bowel sounds + Extremities: able to move left leg, clean d/c/i. Pedal pulses intact.   Data Reviewed: I have personally reviewed following labs and imaging studies  CBC: Recent Labs  Lab 10/09/17 1929 10/10/17 0409 10/10/17 0732 10/10/17 1050  WBC 3.1* 4.9 4.8 5.3  NEUTROABS 1.9  --  3.8  --   HGB 11.2* 11.6* 11.3* 11.6*  HCT 32.0* 34.3* 32.7* 33.7*  MCV 88.9 89.3 90.1 90.6  PLT 150 168 166 627   Basic Metabolic Panel: Recent Labs  Lab 10/09/17 1929 10/10/17 0732  NA 131* 134*  K 2.6* 3.0*  CL 97* 102  CO2 20* 22  GLUCOSE 74 70  BUN 15 11  CREATININE 0.96 0.92  CALCIUM 8.0* 8.0*   GFR: Estimated Creatinine Clearance: 50.3 mL/min (by C-G formula based on SCr of 0.92 mg/dL). Liver Function Tests: Recent Labs  Lab 10/09/17 1929 10/10/17 0732  AST 56* 53*  ALT 22 22  ALKPHOS 79 81  BILITOT 0.6 0.9  PROT 6.3* 6.0*  ALBUMIN 3.6 3.7   No results for input(s): LIPASE, AMYLASE in the last 168 hours. No results for input(s): AMMONIA in the last 168 hours. Coagulation Profile: Recent Labs  Lab 10/09/17 1929  INR 0.94   Cardiac Enzymes: Recent Labs  Lab 10/10/17 0732  TROPONINI <0.03   BNP  (last 3 results) No results for input(s): PROBNP in the last 8760 hours. HbA1C: No results for input(s): HGBA1C in the last 72 hours. CBG: No results for input(s): GLUCAP in the last 168 hours. Lipid Profile: No results for input(s): CHOL, HDL, LDLCALC, TRIG, CHOLHDL, LDLDIRECT in the last 72 hours. Thyroid Function Tests: Recent Labs    10/10/17 0732  TSH 86.127*   Anemia Panel: No results for input(s): VITAMINB12, FOLATE, FERRITIN, TIBC, IRON, RETICCTPCT in the last 72 hours. Sepsis Labs: No results for input(s): PROCALCITON, LATICACIDVEN in the last 168 hours.  Recent Results (from the past 240 hour(s))  Surgical pcr screen     Status: Abnormal   Collection Time: 10/10/17  2:33 AM  Result Value Ref Range Status   MRSA, PCR NEGATIVE NEGATIVE Final   Staphylococcus aureus POSITIVE (A) NEGATIVE Final    Comment: (NOTE) The Xpert SA Assay (FDA approved for NASAL specimens in patients 27 years of age and older), is one component of a comprehensive surveillance program. It is not intended to diagnose infection nor to guide or monitor treatment.       Radiology Studies: Ct Head Wo Contrast  Result Date: 10/11/2017 CLINICAL DATA:  Altered mental status and hypotension EXAM: CT HEAD WITHOUT CONTRAST TECHNIQUE: Contiguous axial images were obtained from the base of the skull through the vertex without intravenous contrast. COMPARISON:  06/19/2017 FINDINGS: Brain: Mild atrophic changes and chronic white matter ischemic changes are again seen and stable. No findings to suggest acute hemorrhage, acute infarction or space-occupying mass lesion are noted. Vascular: No hyperdense vessel or unexpected calcification. Skull: Normal. Negative for fracture or focal lesion. Sinuses/Orbits: No acute finding. Other: None. IMPRESSION: Mild atrophic and ischemic changes stable from the prior exam. No acute abnormality noted. Electronically Signed   By: Inez Catalina M.D.   On: 10/11/2017 08:11   Dg  Chest Portable 1 View  Result Date: 10/09/2017 CLINICAL DATA:  Preop, fractured hip EXAM: PORTABLE CHEST 1 VIEW COMPARISON:  08/10/2017 FINDINGS: Surgical clips in the right axilla. Old distal right clavicle deformity. No acute pulmonary infiltrate, consolidation or effusion. Cardiomediastinal silhouette probably within normal limits allowing for rotation which exaggerates the mediastinal width. No pneumothorax. Multiple old bilateral rib fractures. IMPRESSION: Rotated patient. No radiographic evidence for acute cardiopulmonary abnormality. Electronically Signed   By: Donavan Foil M.D.   On: 10/09/2017 23:41   Dg Shoulder Left Port  Result Date: 10/10/2017 CLINICAL DATA:  Pain EXAM: LEFT SHOULDER - 2 VIEW COMPARISON:  August 10, 2017 FINDINGS: Frontal and Y scapular images were obtained. No acute fracture or dislocation. There is moderate generalized osteoarthritic change. No erosive change or intra-articular calcification. There is evidence  of old rib trauma on the left. Visualized left lung clear. IMPRESSION: Moderate generalized osteoarthritic change. No acute fracture or dislocation. Evidence of old rib trauma on the left. Electronically Signed   By: Lowella Grip III M.D.   On: 10/10/2017 08:53   Dg Hip Unilat W Or Wo Pelvis 2-3 Views Left  Result Date: 10/09/2017 CLINICAL DATA:  Left hip pain EXAM: DG HIP (WITH OR WITHOUT PELVIS) 2-3V LEFT COMPARISON:  None. FINDINGS: There are is a transverse fracture of the left femoral neck with associated overriding. The femoral head remains approximated within the acetabulum. No abnormal lucency surrounding the bilateral sacroiliac screws. IMPRESSION: Left femoral neck fracture without dislocation. Electronically Signed   By: Ulyses Jarred M.D.   On: 10/09/2017 22:56      Scheduled Meds: . [MAR Hold] chlordiazePOXIDE  25 mg Oral TID   Followed by  . [MAR Hold] chlordiazePOXIDE  25 mg Oral BID   Followed by  . [MAR Hold] chlordiazePOXIDE  25 mg  Oral Daily  . Chlorhexidine Gluconate Cloth  6 each Topical Daily  . [MAR Hold] folic acid  1 mg Oral Daily  . [MAR Hold] multivitamin with minerals  1 tablet Oral Daily  . mupirocin ointment  1 application Nasal BID  . [MAR Hold] pantoprazole  40 mg Intravenous Q12H  . [MAR Hold] thiamine  100 mg Intravenous Daily   Continuous Infusions: . ceFAZolin    . lactated ringers 10 mL/hr at 10/11/17 1123  . pantoprozole (PROTONIX) infusion 8 mg/hr (10/11/17 0903)     LOS: 1 day    Time spent: Total of 25 minutes spent with pt, greater than 50% of which was spent in discussion of  treatment, counseling and coordination of care    Chipper Oman, MD Pager: Text Page via [email or web address]   If 7PM-7AM, please contact night-coverage [email or web address] 10/11/2017, 2:48 PM

## 2017-10-11 NOTE — Anesthesia Procedure Notes (Signed)
Procedure Name: MAC Date/Time: 10/11/2017 12:53 PM Performed by: Jenne Campus, CRNA Pre-anesthesia Checklist: Patient identified, Emergency Drugs available, Suction available, Patient being monitored and Timeout performed Oxygen Delivery Method: Nasal cannula

## 2017-10-12 ENCOUNTER — Other Ambulatory Visit: Payer: Self-pay

## 2017-10-12 DIAGNOSIS — S72002A Fracture of unspecified part of neck of left femur, initial encounter for closed fracture: Secondary | ICD-10-CM | POA: Diagnosis not present

## 2017-10-12 LAB — BASIC METABOLIC PANEL
ANION GAP: 8 (ref 5–15)
BUN: 14 mg/dL (ref 6–20)
CHLORIDE: 100 mmol/L — AB (ref 101–111)
CO2: 21 mmol/L — AB (ref 22–32)
Calcium: 8.3 mg/dL — ABNORMAL LOW (ref 8.9–10.3)
Creatinine, Ser: 1.02 mg/dL — ABNORMAL HIGH (ref 0.44–1.00)
GFR calc non Af Amer: 55 mL/min — ABNORMAL LOW (ref >60)
Glucose, Bld: 144 mg/dL — ABNORMAL HIGH (ref 65–99)
POTASSIUM: 4.8 mmol/L (ref 3.5–5.1)
Sodium: 129 mmol/L — ABNORMAL LOW (ref 135–145)

## 2017-10-12 LAB — CBC
HEMATOCRIT: 33.5 % — AB (ref 36.0–46.0)
HEMOGLOBIN: 11.3 g/dL — AB (ref 12.0–15.0)
MCH: 31.5 pg (ref 26.0–34.0)
MCHC: 33.7 g/dL (ref 30.0–36.0)
MCV: 93.3 fL (ref 78.0–100.0)
Platelets: 158 10*3/uL (ref 150–400)
RBC: 3.59 MIL/uL — AB (ref 3.87–5.11)
RDW: 17 % — AB (ref 11.5–15.5)
WBC: 5.3 10*3/uL (ref 4.0–10.5)

## 2017-10-12 LAB — MAGNESIUM: Magnesium: 1.1 mg/dL — ABNORMAL LOW (ref 1.7–2.4)

## 2017-10-12 LAB — T4, FREE: FREE T4: 0.43 ng/dL — AB (ref 0.61–1.12)

## 2017-10-12 MED ORDER — MORPHINE SULFATE (PF) 4 MG/ML IV SOLN
0.5000 mg | INTRAVENOUS | Status: DC | PRN
  Filled 2017-10-12: qty 1

## 2017-10-12 MED ORDER — VITAMIN B-1 100 MG PO TABS
100.0000 mg | ORAL_TABLET | Freq: Every day | ORAL | Status: DC
  Administered 2017-10-13 – 2017-10-17 (×5): 100 mg via ORAL
  Filled 2017-10-12 (×5): qty 1

## 2017-10-12 MED ORDER — MAGNESIUM SULFATE 2 GM/50ML IV SOLN
2.0000 g | Freq: Once | INTRAVENOUS | Status: AC
  Administered 2017-10-12: 2 g via INTRAVENOUS
  Filled 2017-10-12: qty 50

## 2017-10-12 MED ORDER — SODIUM CHLORIDE 0.9 % IV SOLN
INTRAVENOUS | Status: DC
  Administered 2017-10-12 – 2017-10-15 (×6): via INTRAVENOUS

## 2017-10-12 NOTE — Progress Notes (Signed)
PROGRESS NOTE    Susan Mccarthy  JYN:[id number] DOB: [birth year] DOA: 10/09/2017 PCP: Patient, No Pcp Per     Brief Narrative:  Susan Mccarthy is a 66 year old female with medical history of alcohol abuse, hypothyroidism, metastatic melanoma who came to the ED after patient had a fall at a restaurant.Upon ED evaluation x-ray showed left hip fracture. While waiting in the ED patient had one episode of nausea and vomiting which was dark/bloody.Patient was admitted for hip fracture repair and GI bleed workup. Patient underwent left hip hemiarthroplasty on 11/9.   Assessment & Plan:   Principal Problem:   Closed left hip fracture (H[facility]) Active Problems:   Malignant melanoma, metastatic (HCC)   Hypothyroidism, postop   Hypokalemia   Alcoholic intoxication without complication (HCC)   Hip fracture ([facility]CC)   Closed left hip fracture-status post fall S/p left hip hemiarthroplasty 11/9  Continue management per ortho  PT OT evaluation, likely will need SNF placement   Hematemesis-witnessed in the ED One episode,hemoglobin stable  GI recommendations appreciated - may need EGD as outpatient Continue protonix  Alcohol abuse, monitor for alcohol withdrawal Last drink was day prior to coming to the hospital Patient on librium taper and CIWA PRN   Hyponatremia  -IV normal saline, trend BMP   Hypomagnesemia Replace, trend   Hypothyroidism  Significantly elevated TSH with low T4  Increased synthroid to 150 mcg  Follow up TSH in 6 weeks as outpatient   Hx of metastatic melanoma and thyroid cancer     DVT prophylaxis: lovenox  Code Status: Full Family Communication: No family at bedside Disposition Plan: Pending PT OT evaluation    Consultants:   Orthopedic surgery   Procedures:   S/p left hip hemiarthroplasty 11/9  Antimicrobials:  Anti-infectives (From admission, onward)   Start     Dose/Rate Route Frequency Ordered Stop   10/11/17 1930  ceFAZolin (ANCEF)  IVPB 2g/100 mL premix     2 g 200 mL/hr over 30 Minutes Intravenous Every 6 hours 10/11/17 1654 10/12/17 0506   10/11/17 1233  ceFAZolin (ANCEF) 2-4 GM/100ML-% IVPB    Comments:  Merryl Hacker   : cabinet override      10/11/17 1233 10/12/17 0044   10/10/17 0730  ceFAZolin (ANCEF) IVPB 2g/100 mL premix     2 g 200 mL/hr over 30 Minutes Intravenous On call to O.R. 10/10/17 0716 10/11/17 0559        Subjective: Patient without any acute complaints today, continues to have some pain in her left hip.  Denies any current chest pain or shortness of breath, no abdominal pain.  No further episodes of hematemesis.  No acute events overnight, refused Librium today.  Objective: Vitals:   10/11/17 1851 10/11/17 2131 10/12/17 0100 10/12/17 0422  BP: 118/67 119/79 117/65 (!) 129/93  Pulse: 69 64 62 70  Resp: 17 16 12 14   Temp: 98 F (36.7 C) 97.7 F (36.5 C) 98.2 F (36.8 C) 97.9 F (36.6 C)  TempSrc: Oral Oral Oral Oral  SpO2: 96% 95% 97% 99%  Weight:      Height:        Intake/Output Summary (Last 24 hours) at 10/12/2017 1234 Last data filed at 10/12/2017 0506 Gross per 24 hour  Intake 1710 ml  Output 250 ml  Net 1460 ml   Filed Weights   10/10/17 1123  Weight: 54.4 kg (120 lb)    Examination:  General exam: Appears calm and comfortable  Respiratory system: Clear to auscultation. Respiratory  effort normal. Cardiovascular system: S1 & S2 heard, RRR. No JVD, murmurs, rubs, gallops or clicks. No pedal edema. Gastrointestinal system: Abdomen is nondistended, soft and nontender. No organomegaly or masses felt. Normal bowel sounds heard. Central nervous system: Alert and oriented. No focal neurological deficits. Extremities: Symmetric, +left hip dressing clean and dry  Skin: No rashes, lesions or ulcers Psychiatry: Judgement and insight appear poor   Data Reviewed: I have personally reviewed following labs and imaging studies  CBC: Recent Labs  Lab 10/09/17 1929  10/10/17 0409 10/10/17 0732 10/10/17 1050 10/12/17 0441  WBC 3.1* 4.9 4.8 5.3 5.3  NEUTROABS 1.9  --  3.8  --   --   HGB 11.2* 11.6* 11.3* 11.6* 11.3*  HCT 32.0* 34.3* 32.7* 33.7* 33.5*  MCV 88.9 89.3 90.1 90.6 93.3  PLT 150 168 166 160 124   Basic Metabolic Panel: Recent Labs  Lab 10/09/17 1929 10/10/17 0732 10/11/17 1810 10/12/17 0441  NA 131* 134*  --  129*  K 2.6* 3.0*  --  4.8  CL 97* 102  --  100*  CO2 20* 22  --  21*  GLUCOSE 74 70  --  144*  BUN 15 11  --  14  CREATININE 0.96 0.92  --  1.02*  CALCIUM 8.0* 8.0*  --  8.3*  MG  --   --  1.2* 1.1*   GFR: Estimated Creatinine Clearance: 45.3 mL/min (A) (by C-G formula based on SCr of 1.02 mg/dL (H)). Liver Function Tests: Recent Labs  Lab 10/09/17 1929 10/10/17 0732  AST 56* 53*  ALT 22 22  ALKPHOS 79 81  BILITOT 0.6 0.9  PROT 6.3* 6.0*  ALBUMIN 3.6 3.7   No results for input(s): LIPASE, AMYLASE in the last 168 hours. No results for input(s): AMMONIA in the last 168 hours. Coagulation Profile: Recent Labs  Lab 10/09/17 1929  INR 0.94   Cardiac Enzymes: Recent Labs  Lab 10/10/17 0732  TROPONINI <0.03   BNP (last 3 results) No results for input(s): PROBNP in the last 8760 hours. HbA1C: No results for input(s): HGBA1C in the last 72 hours. CBG: No results for input(s): GLUCAP in the last 168 hours. Lipid Profile: No results for input(s): CHOL, HDL, LDLCALC, TRIG, CHOLHDL, LDLDIRECT in the last 72 hours. Thyroid Function Tests: Recent Labs    10/10/17 0732 10/12/17 0441  TSH 86.127*  --   FREET4  --  0.43*   Anemia Panel: No results for input(s): VITAMINB12, FOLATE, FERRITIN, TIBC, IRON, RETICCTPCT in the last 72 hours. Sepsis Labs: No results for input(s): PROCALCITON, LATICACIDVEN in the last 168 hours.  Recent Results (from the past 240 hour(s))  Surgical pcr screen     Status: Abnormal   Collection Time: 10/10/17  2:33 AM  Result Value Ref Range Status   MRSA, PCR NEGATIVE NEGATIVE  Final   Staphylococcus aureus POSITIVE (A) NEGATIVE Final    Comment: (NOTE) The Xpert SA Assay (FDA approved for NASAL specimens in patients 8 years of age and older), is one component of a comprehensive surveillance program. It is not intended to diagnose infection nor to guide or monitor treatment.        Radiology Studies: Ct Head Wo Contrast  Result Date: 10/11/2017 CLINICAL DATA:  Altered mental status and hypotension EXAM: CT HEAD WITHOUT CONTRAST TECHNIQUE: Contiguous axial images were obtained from the base of the skull through the vertex without intravenous contrast. COMPARISON:  06/19/2017 FINDINGS: Brain: Mild atrophic changes and chronic white matter ischemic  changes are again seen and stable. No findings to suggest acute hemorrhage, acute infarction or space-occupying mass lesion are noted. Vascular: No hyperdense vessel or unexpected calcification. Skull: Normal. Negative for fracture or focal lesion. Sinuses/Orbits: No acute finding. Other: None. IMPRESSION: Mild atrophic and ischemic changes stable from the prior exam. No acute abnormality noted. Electronically Signed   By: Inez Catalina M.D.   On: 10/11/2017 08:11   Pelvis Portable  Result Date: 10/11/2017 CLINICAL DATA:  Left hip replacement.  Left femoral neck fracture. EXAM: PORTABLE PELVIS 1-2 VIEWS COMPARISON:  10/09/2017 FINDINGS: AP view of the pelvis obtained portably at 1524 hours shows the patient to be status post interval left hip hemiarthroplasty. The inferior extent of the femoral prosthesis has not been included on the film. Femoral component appears located on this single projection. Gas in the overlying soft tissues is compatible with the recent surgery. Patient is again noted to be status post bilateral sacroiliac fusion. IMPRESSION: Immediately status post left hip hemiarthroplasty with incomplete visualization of the femoral component. No evidence for immediate complicating features on the visualized image.  Electronically Signed   By: Misty Stanley M.D.   On: 10/11/2017 15:47      Scheduled Meds: . chlordiazePOXIDE  25 mg Oral BID   Followed by  . [START ON 10/13/2017] chlordiazePOXIDE  25 mg Oral Daily  . Chlorhexidine Gluconate Cloth  6 each Topical Daily  . docusate sodium  100 mg Oral BID  . enoxaparin (LOVENOX) injection  40 mg Subcutaneous Q24H  . folic acid  1 mg Oral Daily  . levothyroxine  150 mcg Oral QAC breakfast  . multivitamin with minerals  1 tablet Oral Daily  . mupirocin ointment  1 application Nasal BID  . pantoprazole  40 mg Oral BID  . senna  1 tablet Oral BID  . thiamine  100 mg Oral Daily   Continuous Infusions: . sodium chloride 100 mL/hr at 10/12/17 0941  . methocarbamol (ROBAXIN)  IV       LOS: 2 days    Time spent: 40 minutes   Dessa Phi, DO [facility] [email or web address] Password [facility] 10/12/2017, 12:34 PM

## 2017-10-12 NOTE — Evaluation (Signed)
Occupational Therapy Evaluation Patient Details Name: Susan Mccarthy MRN: [id number] DOB: 12/30/1950 Today's Date: 10/12/2017    History of Present Illness [name] is a 66 year old female with medical history of alcohol abuse, hypothyroid, metastatic melanoma who came to the ED after patient had a fall at a restaurant.  Upon ED evaluation x-ray showed left hip fracture.  While waiting in the ED patient had one episode of nausea and vomiting which was dark/bloody.  Patient was admitted for hip fracture repair and GI bleed workup. Patient underwent left hip hemiarthroplasty.    Clinical Impression   PTA, pt was living alone and performing her ADLs and IADLs. Pt requiring Max A for LB ADLs and Mod A for bed mobility. Pt limited by pain and requesting to return to supine in bed. Pt requiring increased education on posterior hip precautions. Pt would benefit from further acute OT to facilitate safe dc. Recommend dc to SNF for OT to increase safety and independence before returning home alone.     Follow Up Recommendations  SNF;Supervision/Assistance - 24 hour    Equipment Recommendations  Other (comment)(Defer to next venue)    Recommendations for Other Services PT consult     Precautions / Restrictions Precautions Precautions: Posterior Hip Precaution Booklet Issued: Yes (comment) Precaution Comments: Reviewed post hip precautions.  Restrictions Weight Bearing Restrictions: Yes LLE Weight Bearing: Weight bearing as tolerated      Mobility Bed Mobility Overal bed mobility: Needs Assistance Bed Mobility: Supine to Sit;Sit to Supine     Supine to sit: Min assist;HOB elevated Sit to supine: Mod assist   General bed mobility comments: Pt able to transition to EOB with HOB elevated to Min A to transition hips with pad. Mod A to manage BLEs to return to supine  Transfers                      Balance Overall balance assessment: Needs assistance Sitting-balance  support: Bilateral upper extremity supported;Feet supported Sitting balance-Leahy Scale: Fair                                     ADL either performed or assessed with clinical judgement   ADL Overall ADL's : Needs assistance/impaired Eating/Feeding: Set up;Sitting   Grooming: Set up;Sitting   Upper Body Bathing: Minimal assistance;Sitting   Lower Body Bathing: Maximal assistance;Sit to/from stand   Upper Body Dressing : Minimal assistance;Sitting   Lower Body Dressing: Maximal assistance;Sit to/from stand                 General ADL Comments: Pt limited by pain and requested to return to supine once at EOB. Pt requiring Max A for LB ADLs due to posterior hip precautions. Pt needing additional education on precautions.      Vision         Perception     Praxis      Pertinent Vitals/Pain Pain Assessment: 0-10 Pain Score: 10-Worst pain ever Pain Location: L hip Pain Descriptors / Indicators: Constant;Discomfort;Grimacing;Guarding;Operative site guarding Pain Intervention(s): Limited activity within patient's tolerance;Monitored during session;Repositioned;Patient requesting pain meds-RN notified     Hand Dominance     Extremity/Trunk Assessment Upper Extremity Assessment Upper Extremity Assessment: Overall WFL for tasks assessed   Lower Extremity Assessment Lower Extremity Assessment: Defer to PT evaluation       Communication Communication Communication: No difficulties   Cognition Arousal/Alertness: Awake/alert  Behavior During Therapy: Anxious Overall Cognitive Status: No family/caregiver present to determine baseline cognitive functioning                                     General Comments       Exercises     Shoulder Instructions      Home Living Family/patient expects to be discharged to:: Skilled nursing facility                                 Additional Comments: Pt lives alone and will  require SNF rehab      Prior Functioning/Environment Level of Independence: Independent        Comments: Pt not providing alot of information on PLOF        OT Problem List: Decreased strength;Decreased range of motion;Decreased activity tolerance;Impaired balance (sitting and/or standing);Decreased safety awareness;Decreased knowledge of use of DME or AE;Decreased knowledge of precautions;Pain      OT Treatment/Interventions: Self-care/ADL training;Therapeutic exercise;Energy conservation;DME and/or AE instruction;Therapeutic activities;Patient/family education    OT Goals(Current goals can be found in the care plan section) Acute Rehab OT Goals Patient Stated Goal: stop pain OT Goal Formulation: With patient Time For Goal Achievement: 10/26/17 Potential to Achieve Goals: Good ADL Goals Pt Will Perform Lower Body Dressing: with min assist;sit to/from stand;with adaptive equipment Pt Will Transfer to Toilet: with min assist;ambulating;bedside commode Pt Will Perform Toileting - Clothing Manipulation and hygiene: with min assist;sit to/from stand Additional ADL Goal #1: Pt will independently recall 3/3 posterior hip precautions  OT Frequency: Min 2X/week   Barriers to D/C: Decreased caregiver support  Lives alone       Co-evaluation              AM-PAC PT "6 Clicks" Daily Activity     Outcome Measure Help from another person eating meals?: None Help from another person taking care of personal grooming?: A Little Help from another person toileting, which includes using toliet, bedpan, or urinal?: A Lot Help from another person bathing (including washing, rinsing, drying)?: A Lot Help from another person to put on and taking off regular upper body clothing?: A Little Help from another person to put on and taking off regular lower body clothing?: A Lot 6 Click Score: 16   End of Session Nurse Communication: Mobility status  Activity Tolerance: Patient limited by  pain Patient left: in bed;with call bell/phone within reach;with bed alarm set  OT Visit Diagnosis: Unsteadiness on feet (R26.81);Other abnormalities of gait and mobility (R26.89);Muscle weakness (generalized) (M62.81);Pain;History of falling (Z91.81) Pain - Right/Left: Left Pain - part of body: Hip                Time: 2778-2423 OT Time Calculation (min): 20 min Charges:  OT General Charges $OT Visit: 1 Visit OT Evaluation $OT Eval Moderate Complexity: 1 Mod G-Codes:     [place] MSOT, OTR/L Acute Rehab Pager: [phone] Office: [place] 10/12/2017, 1:05 PM
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
## 2017-10-12 NOTE — Progress Notes (Signed)
SLP Cancellation Note  Patient Details Name: LENAH MESSENGER MRN: [id number] DOB: [birth year]   Cancelled treatment:       Reason Eval/Treat Not Completed: Patient declined, no reason specified   Elvina Sidle, M.S., CCC-SLP 10/12/2017, 2:05 PM

## 2017-10-12 NOTE — Progress Notes (Signed)
[facility]opedic [facility] Progress Note Weekend Coverage  Patient ID: Susan Mccarthy MRN: [id number] DOB/AGE: [birth year] 66 y.o.  Subjective:  Doing well postop day 1 About to get up with PT to a chair  Patient states that she drinks a lot  Drinks beer or liquor and wine Lives alone, no real social support States she has alienated most of her family She does not drive and takes a taxi everywhere  ROS As above  Objective:   VITALS:   Vitals:   10/11/17 1851 10/11/17 2131 10/12/17 0100 10/12/17 0422  BP: 118/67 119/79 117/65 (!) 129/93  Pulse: 69 64 62 70  Resp: 17 16 12 14   Temp: 98 F (36.7 C) 97.7 F (36.5 C) 98.2 F (36.8 C) 97.9 F (36.6 C)  TempSrc: Oral Oral Oral Oral  SpO2: 96% 95% 97% 99%  Weight:      Height:        Estimated body mass index is 20.6 kg/m as calculated from the following:   Height as of this encounter: 5\' 4"  (1.626 m).   Weight as of this encounter: 54.4 kg (120 lb).   Intake/Output      11/09 0701 - 11/10 0700 11/10 0701 - 11/11 0700   P.O. 60    I.V. (mL/kg) 750 (13.8)    Other 800    IV Piggyback 100    Total Intake(mL/kg) 1710 (31.4)    Urine (mL/kg/hr) 150 (0.1)    Blood 100    Total Output 250    Net +1460           LABS  Results for orders placed or performed during the hospital encounter of 10/09/17 (from the past 24 hour(s))  Magnesium     Status: Abnormal   Collection Time: 10/11/17  6:10 PM  Result Value Ref Range   Magnesium 1.2 (L) 1.7 - 2.4 mg/dL  CBC     Status: Abnormal   Collection Time: 10/12/17  4:41 AM  Result Value Ref Range   WBC 5.3 4.0 - 10.5 K/uL   RBC 3.59 (L) 3.87 - 5.11 MIL/uL   Hemoglobin 11.3 (L) 12.0 - 15.0 g/dL   HCT 33.5 (L) 36.0 - 46.0 %   MCV 93.3 78.0 - 100.0 fL   MCH 31.5 26.0 - 34.0 pg   MCHC 33.7 30.0 - 36.0 g/dL   RDW 17.0 (H) 11.5 - 15.5 %   Platelets 158 150 - 400 K/uL  Basic metabolic panel     Status: Abnormal   Collection Time: 10/12/17  4:41 AM   Result Value Ref Range   Sodium 129 (L) 135 - 145 mmol/L   Potassium 4.8 3.5 - 5.1 mmol/L   Chloride 100 (L) 101 - 111 mmol/L   CO2 21 (L) 22 - 32 mmol/L   Glucose, Bld 144 (H) 65 - 99 mg/dL   BUN 14 6 - 20 mg/dL   Creatinine, Ser 1.02 (H) 0.44 - 1.00 mg/dL   Calcium 8.3 (L) 8.9 - 10.3 mg/dL   GFR calc non Af Amer 55 (L) >60 mL/min   GFR calc Af Amer >60 >60 mL/min   Anion gap 8 5 - 15  Magnesium     Status: Abnormal   Collection Time: 10/12/17  4:41 AM  Result Value Ref Range   Magnesium 1.1 (L) 1.7 - 2.4 mg/dL  T4, free     Status: Abnormal   Collection Time: 10/12/17  4:41 AM  Result Value Ref Range   Free T4  0.43 (L) 0.61 - 1.12 ng/dL     PHYSICAL EXAM:   Gen: In bed, no acute distress  Generalized sarcopenia   Appears older than stated age Lungs: Clear anterior fields Cardiac: RRR Abd: + BS, NT Ext:     Left lower extremity    Dressing is clean, dry and intact  Extremity is warm  Palpable DP pulse  No significant swelling  Knee is nontender, ankle is nontender  DPN, SPN, TN sensory function intact  EHL, FHL, anterior tibialis, posterior tibialis, peroneals and gastrocsoleus complex motor functions intact  Patient can perform a quad set  Assessment/Plan: 1 Day Post-Op   Principal Problem:   Closed left hip fracture (HCC) Active Problems:   Malignant melanoma, metastatic ([place])   Hypothyroidism, postop   Hypokalemia   Alcoholic intoxication without complication ([place])   Hip fracture ([place])   Anti-infectives (From admission, onward)   Start     Dose/Rate Route Frequency Ordered Stop   10/11/17 1930  ceFAZolin (ANCEF) IVPB 2g/100 mL premix     2 g 200 mL/hr over 30 Minutes Intravenous Every 6 hours 10/11/17 1654 10/12/17 0506   10/11/17 1233  ceFAZolin (ANCEF) 2-4 GM/100ML-% IVPB    Comments:  Merryl Hacker   : cabinet override      10/11/17 1233 10/12/17 0044   10/10/17 0730  ceFAZolin (ANCEF) IVPB 2g/100 mL premix     2 g 200 mL/hr over 30 Minutes  Intravenous On call to O.R. 10/10/17 9357 10/11/17 0559    .  POD/HD#: 30  66 year old female status post fall with left femoral neck fracture  -Left femoral neck fracture s/p left hip hemiarthroplasty  Weight-bear as tolerated left leg  Posterior hip precautions left hip  PT and OT  Dressing change tomorrow  Skilled nursing facility placement   Patient at elevated risk for complications given her profound alcohol dependence  - Pain management:  Minimize narcotics  Obviously have to be careful with Tylenol given alcohol history  - ABL anemia/Hemodynamics  Monitor  Currently stable  - Medical issues   Per primary service  Patient with hypomagnesemia which will need to be repleted  Hyponatremia  Thyroid dysfunction  On EtOH withdrawal protocol   - DVT/PE prophylaxis:  Lovenox - ID:   Perioperative antibiotics  - Metabolic Bone Disease:  Check vitamin D levels  - Activity:  Up with assistance  - FEN/GI prophylaxis/Foley/Lines:  Will place order for swallow eval given history   - Dispo:  Continue with therapies  Social work eval for skilled nursing center    Jari Pigg, PA-C [facility] [phone] Susan Mccarthy (C) 10/12/2017, 12:08 PM

## 2017-10-12 NOTE — Progress Notes (Addendum)
Pt with episodes of confusion, easily reoriented. Tried to get out of bed w/o calling for assistance. Bed alarm placed. Frequent checks done.

## 2017-10-13 DIAGNOSIS — S72002A Fracture of unspecified part of neck of left femur, initial encounter for closed fracture: Secondary | ICD-10-CM | POA: Diagnosis not present

## 2017-10-13 LAB — BASIC METABOLIC PANEL
ANION GAP: 6 (ref 5–15)
BUN: 12 mg/dL (ref 6–20)
CALCIUM: 7.3 mg/dL — AB (ref 8.9–10.3)
CO2: 23 mmol/L (ref 22–32)
Chloride: 102 mmol/L (ref 101–111)
Creatinine, Ser: 0.94 mg/dL (ref 0.44–1.00)
GLUCOSE: 88 mg/dL (ref 65–99)
Potassium: 4.2 mmol/L (ref 3.5–5.1)
SODIUM: 131 mmol/L — AB (ref 135–145)

## 2017-10-13 LAB — CBC
HCT: 31 % — ABNORMAL LOW (ref 36.0–46.0)
Hemoglobin: 10.4 g/dL — ABNORMAL LOW (ref 12.0–15.0)
MCH: 31 pg (ref 26.0–34.0)
MCHC: 33.5 g/dL (ref 30.0–36.0)
MCV: 92.5 fL (ref 78.0–100.0)
PLATELETS: 143 10*3/uL — AB (ref 150–400)
RBC: 3.35 MIL/uL — AB (ref 3.87–5.11)
RDW: 16.9 % — AB (ref 11.5–15.5)
WBC: 3.3 10*3/uL — ABNORMAL LOW (ref 4.0–10.5)

## 2017-10-13 LAB — ETHANOL: Alcohol, Ethyl (B): <10 mg/dL (ref <10)

## 2017-10-13 LAB — MAGNESIUM: MAGNESIUM: 1.4 mg/dL — AB (ref 1.7–2.4)

## 2017-10-13 MED ORDER — LORAZEPAM 1 MG PO TABS
1.0000 mg | ORAL_TABLET | Freq: Four times a day (QID) | ORAL | Status: DC | PRN
  Administered 2017-10-14 – 2017-10-15 (×3): 1 mg via ORAL
  Filled 2017-10-13 (×3): qty 1

## 2017-10-13 MED ORDER — LORAZEPAM 2 MG/ML IJ SOLN: 1.0000 mg | Freq: Four times a day (QID) | INTRAMUSCULAR | Status: DC | PRN

## 2017-10-13 MED ORDER — MAGNESIUM SULFATE 2 GM/50ML IV SOLN
2.0000 g | Freq: Once | INTRAVENOUS | Status: AC
Start: 2017-10-13 — End: 2017-10-13
  Administered 2017-10-13: 2 g via INTRAVENOUS
  Filled 2017-10-13: qty 50

## 2017-10-13 NOTE — Progress Notes (Signed)
Orthopedic Trauma Service Progress Note   Patient ID: Susan Mccarthy MRN: [id number] DOB/AGE: 1951-10-21 66 y.o.  Subjective:  Sitting in bedside chair  Somewhat confused   SLP note reviewed   Pt asking for red bag on counter and in closet Pt has bottle of smirnoff in closet, (about 1/3 full), nurse informed    ROS As above   Objective:   VITALS:   Vitals:   10/12/17 0422 10/12/17 1349 10/12/17 2114 10/13/17 0730  BP: (!) 129/93 (!) 143/88 97/72 95/66   Pulse: 70 71 93 65  Resp: 14 18 16 16   Temp: 97.9 F (36.6 C) 98.3 F (36.8 C) 98.4 F (36.9 C) 97.8 F (36.6 C)  TempSrc: Oral Oral Oral Oral  SpO2: 99% 96% 94% 93%  Weight:      Height:        Estimated body mass index is 20.6 kg/m as calculated from the following:   Height as of this encounter: 5\' 4"  (1.626 m).   Weight as of this encounter: 54.4 kg (120 lb).   Intake/Output      11/10 0701 - 11/11 0700 11/11 0701 - 11/12 0700   P.O. 135    I.V. (mL/kg) 1298.3 (23.9)    Other     IV Piggyback     Total Intake(mL/kg) 1433.3 (26.3)    Urine (mL/kg/hr)     Blood     Total Output     Net +1433.3         Urine Occurrence 3 x      LABS  Results for orders placed or performed during the hospital encounter of 10/09/17 (from the past 24 hour(s))  CBC     Status: Abnormal   Collection Time: 10/13/17  3:13 AM  Result Value Ref Range   WBC 3.3 (L) 4.0 - 10.5 K/uL   RBC 3.35 (L) 3.87 - 5.11 MIL/uL   Hemoglobin 10.4 (L) 12.0 - 15.0 g/dL   HCT 31.0 (L) 36.0 - 46.0 %   MCV 92.5 78.0 - 100.0 fL   MCH 31.0 26.0 - 34.0 pg   MCHC 33.5 30.0 - 36.0 g/dL   RDW 16.9 (H) 11.5 - 15.5 %   Platelets 143 (L) 150 - 400 K/uL  Basic metabolic panel     Status: Abnormal   Collection Time: 10/13/17  3:13 AM  Result Value Ref Range   Sodium 131 (L) 135 - 145 mmol/L   Potassium 4.2 3.5 - 5.1 mmol/L   Chloride 102 101 - 111 mmol/L   CO2 23 22 - 32 mmol/L   Glucose, Bld 88 65 - 99 mg/dL   BUN 12  6 - 20 mg/dL   Creatinine, Ser 0.94 0.44 - 1.00 mg/dL   Calcium 7.3 (L) 8.9 - 10.3 mg/dL   GFR calc non Af Amer >60 >60 mL/min   GFR calc Af Amer >60 >60 mL/min   Anion gap 6 5 - 15  Magnesium     Status: Abnormal   Collection Time: 10/13/17  3:13 AM  Result Value Ref Range   Magnesium 1.4 (L) 1.7 - 2.4 mg/dL     PHYSICAL EXAM:   Gen: sitting in chair, NAD, somewhat confused  Lungs: breathing unlabored Cardiac: regular  Ext:       Left Lower Extremity   Dressing removed  Incision c/d/i   Extremity is warm             Palpable DP pulse  No significant swelling             Knee is nontender, ankle is nontender             DPN, SPN, TN sensory function intact             EHL, FHL, anterior tibialis, posterior tibialis, peroneals and gastrocsoleus complex motor functions intact   Assessment/Plan: 2 Days Post-Op   Principal Problem:   Closed left hip fracture (HCC) Active Problems:   Malignant melanoma, metastatic (HCC)   Hypothyroidism, postop   Hypokalemia   Alcoholic intoxication without complication ([place])   Hip fracture ([place])   Anti-infectives (From admission, onward)   Start     Dose/Rate Route Frequency Ordered Stop   10/11/17 1930  ceFAZolin (ANCEF) IVPB 2g/100 mL premix     2 g 200 mL/hr over 30 Minutes Intravenous Every 6 hours 10/11/17 1654 10/12/17 0506   10/11/17 1233  ceFAZolin (ANCEF) 2-4 GM/100ML-% IVPB    Comments:  Merryl Hacker   : cabinet override      10/11/17 1233 10/12/17 0044   10/10/17 0730  ceFAZolin (ANCEF) IVPB 2g/100 mL premix     2 g 200 mL/hr over 30 Minutes Intravenous On call to O.R. 10/10/17 3810 10/11/17 0559    .  POD/HD#: 70  66 year old female status post fall with left femoral neck fracture   -Left femoral neck fracture s/p left hip hemiarthroplasty             Weight-bear as tolerated left leg             Posterior hip precautions left hip             PT and OT             Dressing changed this am     Changes as needed   Ok to shower and clean wound with soap and water              Skilled nursing facility placement               Patient at elevated risk for complications given her profound alcohol dependence   - Pain management:             Minimize narcotics             Obviously have to be careful with Tylenol given alcohol history   - ABL anemia/Hemodynamics             Monitor             Currently stable   - Medical issues              Per primary service             magnesium remains low, potassium ok   Calcium is low as well   Ionized calcium in am   Electrolyte abnormalities likely related to chronic EtOH use              Hyponatremia- improving              Thyroid dysfunction             On EtOH withdrawal protocol    Vodka found in pts closet, staff aware    - DVT/PE prophylaxis:             Lovenox - ID:              Perioperative antibiotics completed    -  Metabolic Bone Disease:             Check vitamin D levels- pemdomg    - Activity:             Up with assistance   - FEN/GI prophylaxis/Foley/Lines:             electrolyte replacement per primary team    - Dispo:             Continue with therapies             Social work eval for skilled nursing center   Jari Pigg, PA-C [facility] [phone] ([phone] Levi Aland (C) 10/13/2017, 10:19 AM
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
## 2017-10-14 ENCOUNTER — Inpatient Hospital Stay (HOSPITAL_COMMUNITY): Payer: BLUE CROSS/BLUE SHIELD

## 2017-10-14 ENCOUNTER — Encounter (HOSPITAL_COMMUNITY): Payer: Self-pay | Admitting: Orthopedic Surgery

## 2017-10-14 DIAGNOSIS — S72002A Fracture of unspecified part of neck of left femur, initial encounter for closed fracture: Secondary | ICD-10-CM | POA: Diagnosis not present

## 2017-10-14 LAB — COMPREHENSIVE METABOLIC PANEL
ALK PHOS: 67 U/L (ref 38–126)
ALT: 11 U/L — AB (ref 14–54)
AST: 29 U/L (ref 15–41)
Albumin: 2.5 g/dL — ABNORMAL LOW (ref 3.5–5.0)
Anion gap: 5 (ref 5–15)
BILIRUBIN TOTAL: 0.5 mg/dL (ref 0.3–1.2)
BUN: 10 mg/dL (ref 6–20)
CALCIUM: 7.6 mg/dL — AB (ref 8.9–10.3)
CHLORIDE: 101 mmol/L (ref 101–111)
CO2: 24 mmol/L (ref 22–32)
CREATININE: 0.79 mg/dL (ref 0.44–1.00)
GFR calc Af Amer: >60 mL/min (ref >60)
Glucose, Bld: 78 mg/dL (ref 65–99)
Potassium: 3.9 mmol/L (ref 3.5–5.1)
Sodium: 130 mmol/L — ABNORMAL LOW (ref 135–145)
Total Protein: 4.7 g/dL — ABNORMAL LOW (ref 6.5–8.1)

## 2017-10-14 LAB — CBC
HEMATOCRIT: 27.9 % — AB (ref 36.0–46.0)
HEMOGLOBIN: 9.3 g/dL — AB (ref 12.0–15.0)
MCH: 31.4 pg (ref 26.0–34.0)
MCHC: 33.3 g/dL (ref 30.0–36.0)
MCV: 94.3 fL (ref 78.0–100.0)
Platelets: 156 10*3/uL (ref 150–400)
RBC: 2.96 MIL/uL — ABNORMAL LOW (ref 3.87–5.11)
RDW: 17.7 % — ABNORMAL HIGH (ref 11.5–15.5)
WBC: 2.1 10*3/uL — ABNORMAL LOW (ref 4.0–10.5)

## 2017-10-14 LAB — MAGNESIUM: MAGNESIUM: 1.6 mg/dL — AB (ref 1.7–2.4)

## 2017-10-14 LAB — VITAMIN D 25 HYDROXY (VIT D DEFICIENCY, FRACTURES): VIT D 25 HYDROXY: 12.9 ng/mL — AB (ref 30.0–100.0)

## 2017-10-14 MED ORDER — HYDROXYZINE HCL 25 MG PO TABS
12.5000 mg | ORAL_TABLET | Freq: Four times a day (QID) | ORAL | Status: DC | PRN
  Administered 2017-10-15 – 2017-10-17 (×5): 12.5 mg via ORAL
  Filled 2017-10-14 (×5): qty 1

## 2017-10-14 MED ORDER — MAGNESIUM SULFATE 2 GM/50ML IV SOLN
2.0000 g | Freq: Once | INTRAVENOUS | Status: AC
  Administered 2017-10-14: 2 g via INTRAVENOUS
  Filled 2017-10-14: qty 50

## 2017-10-14 NOTE — Clinical Social Work Note (Signed)
Clinical Social Work Assessment  Patient Details  Name: Susan Mccarthy MRN: [id number] Date of Birth: 06/09/1951  Date of referral:  10/14/17               Reason for consult:  Facility Placement                Permission sought to share information with:  Facility Art therapist granted to share information::  Yes, Verbal Permission Granted  Name::        Agency::  SNF  Relationship::     Contact Information:     Housing/Transportation Living arrangements for the past 2 months:  Apartment Source of Information:  Patient Patient Interpreter Needed:  None Criminal Activity/Legal Involvement Pertinent to Current Situation/Hospitalization:  No - Comment as needed Significant Relationships:  Spouse, Adult Children Lives with:  Self Do you feel safe going back to the place where you live?  No Need for family participation in patient care:  Yes (Comment)  Care giving concerns:  Patient states she lives alone, has fallen previously. Patient not safe to return home at time. Clinical team recommends SNF placement.   Social Worker assessment / plan: Patient seemed a little confused during assessment, but amenable to SNF placement. Requested clinical team to return to discuss SNF options. CSW discussed SNF options and placement. Patient requested [facility] and [facility], however SNF's do not provide short term rehab for non residents. CSW obtained permission to send to local SNF's.   CSW will f/u for family support.  Employment status:  Retired Nurse, adult PT Recommendations:  [place] / Referral to community resources:     Patient/Family's Response to care:  Patient appreciative of CSW assistance with SNF process. No other issues or concerns identified at this time.   Patient/Family's Understanding of and Emotional Response to Diagnosis, Current Treatment, and Prognosis:  Patient has some understanding of  diagnosis, current treatment and prognosis. CSW will assist with SNF placement, and follow up for family support. No issues or concerns identified at this time.   Emotional Assessment Appearance:  Appears stated age Attitude/Demeanor/Rapport:  (Anxious, but cooperative with SNF options. ) Affect (typically observed):  Accepting, Appropriate Orientation:  Oriented to Self Alcohol / Substance use:  Not Applicable Psych involvement (Current and /or in the community):  No (Comment)  Discharge Needs  Concerns to be addressed:  Care Coordination Readmission within the last 30 days:  Yes Current discharge risk:  Lives alone, Physical Impairment, Dependent with Mobility Barriers to Discharge:  No Barriers Identified   Alexander [place], [place] 10/14/2017, 10:00 AM

## 2017-10-14 NOTE — Progress Notes (Signed)
Physical Therapy Treatment Patient Details Name: Susan Mccarthy MRN: [id number] DOB: 29-Nov-1951 Today's Date: 10/14/2017    History of Present Illness Susan Mccarthy is a 66 year old female with medical history of alcohol abuse, hypothyroid, metastatic melanoma who came to the ED after patient had a fall at a restaurant.  Upon ED evaluation x-ray showed left hip fracture.  While waiting in the ED patient had one episode of nausea and vomiting which was dark/bloody.  Patient was admitted for hip fracture repair and GI bleed workup. Patient underwent left hip hemiarthroplasty.     PT Comments    Continuing work on functional mobility and activity tolerance;  Still needing reinforcement of Posterior Precautions and how to perform transfers and mobility while keeping precautions; At times difficult to understand (rated her pain level as "pink"); sat back down to bed without warning early in session; tends to be self-limiting   Follow Up Recommendations  SNF;Supervision/Assistance - 24 hour     Equipment Recommendations  Rolling walker with 5" wheels    Recommendations for Other Services       Precautions / Restrictions Precautions Precautions: Posterior Hip Precaution Booklet Issued: (posted in Pt's room) Precaution Comments: Pt able to recall 2/3 precautions this session  Restrictions LLE Weight Bearing: Weight bearing as tolerated    Mobility  Bed Mobility Overal bed mobility: Needs Assistance Bed Mobility: Supine to Sit;Sit to Supine     Supine to sit: Min assist;+2 for safety/equipment Sit to supine: Mod assist;+2 for physical assistance;+2 for safety/equipment   General bed mobility comments: Pt able to advance LEs towards EOB, assist to bring trunk upright and to maintain L hip precautions during transition; assist for trunk and bil LE support when returning to supine for increased safety and maintenance of posterior hip precautions   Transfers Overall transfer level:  Needs assistance Equipment used: Rolling walker (2 wheeled) Transfers: Sit to/from Stand Sit to Stand: Min assist;+2 safety/equipment         General transfer comment: assist to power up and for balance upon standing; cues for safe hand placement and to position LLE for Post hip prec  Ambulation/Gait Ambulation/Gait assistance: Min assist;+2 safety/equipment Ambulation Distance (Feet): 15 Feet Assistive device: Rolling walker (2 wheeled) Gait Pattern/deviations: Step-through pattern;Decreased stance time - left;Decreased step length - right;Decreased step length - left;Decreased weight shift to left;Trunk flexed Gait velocity: decreased   General Gait Details: cues for posture, sequencing, avoiding IR of L hip when turning, and safe use of AD   Stairs            Wheelchair Mobility    Modified Rankin (Stroke Patients Only)       Balance     Sitting balance-Leahy Scale: Fair       Standing balance-Leahy Scale: Poor Standing balance comment: needed UE support to stand                            Cognition Arousal/Alertness: Awake/alert Behavior During Therapy: Anxious;Flat affect Overall Cognitive Status: No family/caregiver present to determine baseline cognitive functioning Area of Impairment: Memory;Following commands;Safety/judgement;Awareness;Problem solving;Attention                   Current Attention Level: Selective Memory: Decreased short-term memory;Decreased recall of precautions Following Commands: Follows one step commands inconsistently;Follows one step commands with increased time Safety/Judgement: Decreased awareness of deficits;Decreased awareness of safety   Problem Solving: Slow processing;Requires verbal cues;Requires tactile cues;Difficulty sequencing General  Comments: Pt appears anxious and emotional during session, is self-limiting with mobility, pt stating "everyone keeps pushing me too hard"      Exercises       General Comments        Pertinent Vitals/Pain Pain Assessment: Faces Faces Pain Scale: Hurts even more Pain Location: L hip Pain Descriptors / Indicators: Grimacing;Guarding;Sore Pain Intervention(s): RN gave pain meds during session    Home Living                      Prior Function            PT Goals (current goals can now be found in the care plan section) Acute Rehab PT Goals Patient Stated Goal: stop pain PT Goal Formulation: With patient Time For Goal Achievement: 10/19/17 Potential to Achieve Goals: Fair Progress towards PT goals: Progressing toward goals    Frequency    Min 5X/week      PT Plan Current plan remains appropriate    Co-evaluation              AM-PAC PT "6 Clicks" Daily Activity  Outcome Measure  Difficulty turning over in bed (including adjusting bedclothes, sheets and blankets)?: Unable Difficulty moving from lying on back to sitting on the side of the bed? : Unable Difficulty sitting down on and standing up from a chair with arms (e.g., wheelchair, bedside commode, etc,.)?: Unable Help needed moving to and from a bed to chair (including a wheelchair)?: A Little Help needed walking in hospital room?: A Little Help needed climbing 3-5 steps with a railing? : A Lot 6 Click Score: 11    End of Session Equipment Utilized During Treatment: Gait belt Activity Tolerance: Patient tolerated treatment well Patient left: in bed;with call bell/phone within reach;with bed alarm set Nurse Communication: Mobility status PT Visit Diagnosis: Unsteadiness on feet (R26.81);Muscle weakness (generalized) (M62.81);Difficulty in walking, not elsewhere classified (R26.2);Pain Pain - Right/Left: Left Pain - part of body: Hip     Time: 1313-1340 PT Time Calculation (min) (ACUTE ONLY): 27 min  Charges:  $Gait Training: 8-22 mins $Therapeutic Activity: 8-22 mins                    G Codes:       Roney Marion, PT  Acute Rehabilitation  Services Pager [phone] Office [phone]    Colletta [place] 10/14/2017, 5:04 PM

## 2017-10-14 NOTE — Care Management Note (Addendum)
Case Management Note  Patient Details  Name: CAROLEEN STOERMER MRN: [id number] Date of Birth: 11-20-1951  Subjective/Objective:   Closed hip fracture, s/p left hip hemiarhtroplasty                Action/Plan: Discharge Planning: Chart reviewed. CSW following for SNF placement.    Expected Discharge Date:  10/14/2017               Expected Discharge Plan:  [place]  In-House Referral:  Clinical Social Work  Discharge planning Services  CM Consult  Post Acute Care Choice:    Choice offered to:     DME Arranged:  N/A DME Agency:  NA  HH Arranged:  NA HH Agency:  NA  Status of Service:  Completed, signed off  If discussed at [employer], dates discussed:    Additional Comments:  Erenest Rasher, RN 10/14/2017, 11:17 AM

## 2017-10-14 NOTE — Progress Notes (Addendum)
[facility] Gastroenterology Progress Note  Susan Mccarthy 66 y.o. January 13, 1951  CC:  hematemesis   Subjective: patient denied any bleeding episode. Denied any black stool. No bowel movement in last 3 days. Passing flatus.  ROS : negative for chest pain and shortness of breath.   Objective: Vital signs in last 24 hours: Vitals:   10/13/17 1403 10/13/17 2156  BP: 91/64 110/79  Pulse: 80 72  Resp: 16 16  Temp: 97.8 F (36.6 C) 98.8 F (37.1 C)  SpO2: 95% 95%    Physical Exam:  General:  Alert, cooperative, no distress, appears stated age  Head:  Normocephalic, without obvious abnormality, atraumatic  Eyes:  , EOM's intact,   Lungs:   Clear to auscultation bilaterally, respirations unlabored  Heart:  Regular rate and rhythm, S1, S2 normal  Abdomen:   Soft, non-tender, bowel sounds active all four quadrants,  no masses, no peritoneal signs          Lab Results: Recent Labs    10/13/17 0313 10/14/17 0534  NA 131* 130*  K 4.2 3.9  CL 102 101  CO2 23 24  GLUCOSE 88 78  BUN 12 10  CREATININE 0.94 0.79  CALCIUM 7.3* 7.6*  MG 1.4* 1.6*   Recent Labs    10/14/17 0534  AST 29  ALT 11*  ALKPHOS 67  BILITOT 0.5  PROT 4.7*  ALBUMIN 2.5*   Recent Labs    10/13/17 0313 10/14/17 0534  WBC 3.3* 2.1*  HGB 10.4* 9.3*  HCT 31.0* 27.9*  MCV 92.5 94.3  PLT 143* 156   No results for input(s): LABPROT, INR in the last 72 hours.    Assessment/Plan: - hematemesis. No further bleeding episode during hospitalization. - anemia - large hiatal hernia based on CT scan of July 2018 - Left hip fracture status post hemiarthroplasty on 10/11/2017 - History of metastatic melanoma - history of alcohol abuse - constipation. Most likely from  narcotic use  Recommendations ------------------------- - Mild t drop in hemoglobin noted without any overt bleeding. Patient denied any melena or further hematemesis. - Upper GI series for further evaluation. - monitor H&H. Continue PPI.  GI will follow  Otis Brace MD, [place] 10/14/2017, 8:06 AM  Contact #  [phone]

## 2017-10-14 NOTE — NC FL2 (Signed)
[place] LEVEL OF CARE SCREENING TOOL     IDENTIFICATION  Patient Name: Susan Mccarthy Birthdate: 12/15/1950 Sex: female Admission Date (Current Location): 10/09/2017  [facility] and [place] Number:  Herbalist and Address:  [facility], [place] [address] [place], [place] [address]      Provider Number: [id number]  Attending Physician Name and Address:  Dessa Phi Chahn-Yan*  Relative Name and Phone Number:       Current Level of Care: Hospital Recommended Level of Care: [place] Prior Approval Number:    Date Approved/Denied:   PASRR Number: [id number]  Discharge Plan: SNF    Current Diagnoses: Patient Active Problem List   Diagnosis Date Noted  . Hip fracture ([place]) 10/10/2017  . Closed left hip fracture ([place]) 10/09/2017  . 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 Major depressive disorder, recurrent severe without psychotic features ([place]) 06/30/2017  . Hypotension 06/14/2017  . Hypokalemia 06/14/2017  . AKI (acute kidney injury) ([place]) 06/14/2017  . Normocytic anemia 06/14/2017  . Hyponatremia 06/14/2017  . Alcoholic intoxication without complication ([place])   . Anxiety   . Hypothyroid 05/04/2017  . Paranoia ([place])   . Generalized anxiety disorder 08/06/2016  . Panic disorder 08/06/2016  . Alcohol use disorder, severe, dependence ([place]) 06/25/2016  . Alcohol withdrawal ([place]) 06/25/2016  . Opioid use disorder, moderate, dependence ([place]) 06/25/2016  . Bipolar disorder, current episode mixed ([place]) 06/25/2016  . Bulimia nervosa 06/25/2016  . Hypothyroidism, postop 07/08/2014  . GERD (gastroesophageal reflux disease) 01/12/2013  . Cancer of thyroid ([place]) 12/19/2012  . Malignant melanoma, metastatic ([place]) 12/10/2012  . Arthritis, degenerative 12/10/2012    Orientation RESPIRATION BLADDER Height & Weight     Self  Normal Incontinent Weight:  120 lb (54.4 kg) Height:  5\' 4"  (162.6 cm)  BEHAVIORAL SYMPTOMS/MOOD NEUROLOGICAL BOWEL NUTRITION STATUS      Continent Diet(See discharge summary. )  AMBULATORY STATUS COMMUNICATION OF NEEDS Skin   Extensive Assist Verbally Surgical wounds                       Personal Care Assistance Level of Assistance  Bathing, Feeding, Dressing Bathing Assistance: Maximum assistance Feeding assistance: Limited assistance Dressing Assistance: Maximum assistance     Functional Limitations Info  Sight, Hearing, Speech Sight Info: Adequate Hearing Info: Adequate Speech Info: Adequate    SPECIAL CARE FACTORS FREQUENCY  OT (By licensed OT), PT (By licensed PT)     PT Frequency: 5x week OT Frequency: 5x week            Contractures Contractures Info: Not present    Additional Factors Info  Code Status, Allergies Code Status Info: Full Code Allergies Info: OTHER, DUST MITE EXTRACT, GRASS EXTRACTS GRAMINEAE POLLENS            Current Medications (10/14/2017):  This is the current hospital active medication list Current Facility-Administered Medications  Medication Dose Route Frequency Provider Last Rate Last Dose  . 0.9 %  sodium chloride infusion   Intravenous Continuous Dessa Phi Chahn-Yang, DO 100 mL/hr at 10/14/17 5621    . bisacodyl (DULCOLAX) suppository 10 mg  10 mg Rectal Daily PRN Prudencio Burly III, PA-C      . Chlorhexidine Gluconate Cloth 2 % PADS 6 each  6 each Topical Daily Patrecia Pour, Christean Grief, MD      . docusate sodium (COLACE) capsule 100 mg  100 mg Oral BID Prudencio Burly III, PA-C   100 mg at 10/14/17 9509  . enoxaparin (LOVENOX) injection 40 mg  40 mg Subcutaneous Q24H Prudencio Burly III, PA-C   40 mg at 32/67/12 4580  . folic acid (FOLVITE) tablet 1 mg  1 mg Oral Daily Rise Patience, MD   1 mg at 10/14/17 0930  . HYDROcodone-acetaminophen (NORCO/VICODIN) 5-325 MG per tablet 1-2 tablet  1-2 tablet Oral Q4H PRN Prudencio Burly III, PA-C   2 tablet at 10/14/17 9983  . levothyroxine (SYNTHROID, LEVOTHROID) tablet 150 mcg  150 mcg Oral QAC breakfast Patrecia Pour, Christean Grief, MD   150 mcg at 10/14/17 [phone]  . LORazepam (ATIVAN) tablet 1 mg  1 mg Oral Q6H PRN Ainsley Spinner, PA-C       Or  . LORazepam (ATIVAN) injection 1 mg  1 mg Intravenous Q6H PRN Ainsley Spinner, PA-C      . magnesium citrate solution 1 Bottle  1 Bottle Oral Once PRN Prudencio Burly III, PA-C      . magnesium sulfate IVPB 2 g 50 mL  2 g Intravenous Once Dessa Phi Chahn-Yang, DO 50 mL/hr at 10/14/17 0928 2 g at 10/14/17 0928  . menthol-cetylpyridinium (CEPACOL) lozenge 3 mg  1 lozenge Oral PRN Prudencio Burly III, PA-C       Or  . phenol (CHLORASEPTIC) mouth spray 1 spray  1 spray Mouth/Throat PRN Prudencio Burly III, PA-C      . methocarbamol (ROBAXIN) tablet 500 mg  500 mg Oral Q6H PRN Prudencio Burly III, PA-C   500 mg at 10/12/17 2018   Or  . methocarbamol (ROBAXIN) 500 mg in dextrose 5 % 50 mL IVPB  500 mg Intravenous Q6H PRN Prudencio Burly III, PA-C      . morphine 4 MG/ML injection 0.52 mg  0.52 mg Intravenous Q4H PRN Dessa Phi Chahn-Yang, DO      . multivitamin with minerals tablet 1 tablet  1 tablet Oral Daily Rise Patience, MD   1 tablet at 10/14/17 [phone]  . mupirocin ointment (BACTROBAN) 2 % 1 application  1 application Nasal BID Patrecia Pour, Christean Grief, MD   1 application at 76/73/41 0932  . pantoprazole (PROTONIX) EC tablet 40 mg  40 mg Oral BID Patrecia Pour, Christean Grief, MD   40 mg at 10/14/17 9379  . polyethylene glycol (MIRALAX / GLYCOLAX) packet 17 g  17 g Oral Daily PRN Prudencio Burly III, PA-C      . senna (SENOKOT) tablet 8.6 mg  1 tablet Oral BID Prudencio Burly III, PA-C   8.6 mg at 10/14/17 0240  . thiamine (VITAMIN B-1) tablet 100 mg  100 mg Oral Daily [name], RPH   100 mg at 10/14/17 9735   Facility-Administered Medications Ordered in Other Encounters   Medication Dose Route Frequency Provider Last Rate Last Dose  . LORazepam (ATIVAN) injection 0.5 mg  0.5 mg Intravenous Once Volanda Napoleon, MD         Discharge Medications: Please see discharge summary for a list of discharge medications.  Relevant Imaging Results:  Relevant Lab Results:   Additional Information SS#[id number]  [place], LCSW

## 2017-10-14 NOTE — Plan of Care (Signed)
  Clinical Measurements: Ability to maintain clinical measurements within normal limits will improve 10/14/2017 1105 - Progressing by Williams Che, RN   Activity: Risk for activity intolerance will decrease 10/14/2017 1105 - Progressing by Williams Che, RN   Nutrition: Adequate nutrition will be maintained 10/14/2017 1105 - Progressing by Williams Che, RN   Coping: Level of anxiety will decrease 10/14/2017 1105 - Progressing by Williams Che, RN   Pain Managment: General experience of comfort will improve 10/14/2017 1105 - Progressing by Williams Che, RN

## 2017-10-14 NOTE — Social Work (Addendum)
CSW followed up with family member (brother) to get more information regarding pt's discharge plans and support. Message left on voicemail.    CSW will continue to follow up.  Leda Min, MSW, LCSWA/Patricia Pencil, [place]   Clinical Social Worker  [phone]

## 2017-10-14 NOTE — NC FL2 (Signed)
[place] LEVEL OF CARE SCREENING TOOL     IDENTIFICATION  Patient Name: Susan Mccarthy Birthdate: 09-22-1951 Sex: female Admission Date (Current Location): 10/09/2017  [facility] and [place] Number:  Herbalist and Address:  [facility] [place], [place] [address] [place], [place] [address]      Provider Number: [id number]  Attending Physician Name and Address:  Dessa Phi Chahn-Yan*  Relative Name and Phone Number:       Current Level of Care: Hospital Recommended Level of Care: [place] Prior Approval Number:    Date Approved/Denied:   PASRR Number: [id number]  Discharge Plan: SNF    Current Diagnoses: Patient Active Problem List   Diagnosis Date Noted  . Hip fracture ([place]) 10/10/2017  . Closed left hip fracture ([place]) 10/09/2017  . 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 Major depressive disorder, recurrent severe without psychotic features ([place]) 06/30/2017  . Hypotension 06/14/2017  . Hypokalemia 06/14/2017  . AKI (acute kidney injury) ([place]) 06/14/2017  . Normocytic anemia 06/14/2017  . Hyponatremia 06/14/2017  . Alcoholic intoxication without complication ([place])   . Anxiety   . Hypothyroid 05/04/2017  . Paranoia ([place])   . Generalized anxiety disorder 08/06/2016  . Panic disorder 08/06/2016  . Alcohol use disorder, severe, dependence ([place]) 06/25/2016  . Alcohol withdrawal ([place]) 06/25/2016  . Opioid use disorder, moderate, dependence ([place]) 06/25/2016  . Bipolar disorder, current episode mixed ([place]) 06/25/2016  . Bulimia nervosa 06/25/2016  . Hypothyroidism, postop 07/08/2014  . GERD (gastroesophageal reflux disease) 01/12/2013  . Cancer of thyroid ([place]) 12/19/2012  . Malignant melanoma, metastatic ([place]) 12/10/2012  . Arthritis, degenerative 12/10/2012    Orientation RESPIRATION BLADDER Height & Weight     Self  Normal Incontinent Weight:  120 lb (54.4 kg) Height:  5\' 4"  (162.6 cm)  BEHAVIORAL SYMPTOMS/MOOD NEUROLOGICAL BOWEL NUTRITION STATUS      Continent Diet(See discharge summary. )  AMBULATORY STATUS COMMUNICATION OF NEEDS Skin   Extensive Assist Verbally Surgical wounds                       Personal Care Assistance Level of Assistance  Bathing, Feeding, Dressing Bathing Assistance: Maximum assistance Feeding assistance: Limited assistance Dressing Assistance: Maximum assistance     Functional Limitations Info  Sight, Hearing, Speech Sight Info: Adequate Hearing Info: Adequate Speech Info: Adequate    SPECIAL CARE FACTORS FREQUENCY  OT (By licensed OT), PT (By licensed PT)     PT Frequency: 5x week OT Frequency: 5x week            Contractures Contractures Info: Not present    Additional Factors Info  Code Status, Allergies Code Status Info: Full Code Allergies Info: OTHER, DUST MITE EXTRACT, GRASS EXTRACTS GRAMINEAE POLLENS            Current Medications (10/14/2017):  This is the current hospital active medication list Current Facility-Administered Medications  Medication Dose Route Frequency Provider Last Rate Last Dose  . 0.9 %  sodium chloride infusion   Intravenous Continuous Dessa Phi Chahn-Yang, DO 100 mL/hr at 10/14/17 0539    . bisacodyl (DULCOLAX) suppository 10 mg  10 mg Rectal Daily PRN Prudencio Burly III, PA-C      . Chlorhexidine Gluconate Cloth 2 % PADS 6 each  6 each Topical Daily Patrecia Pour, Christean Grief, MD      . docusate sodium (COLACE) capsule 100 mg  100 mg Oral BID Prudencio Burly III, PA-C   100 mg at 10/14/17 7517  . enoxaparin (LOVENOX) injection 40 mg  40 mg Subcutaneous Q24H Prudencio Burly III, PA-C   40 mg at 00/17/49 4496  . folic acid (FOLVITE) tablet 1 mg  1 mg Oral Daily Rise Patience, MD   1 mg at 10/14/17 0930  . HYDROcodone-acetaminophen (NORCO/VICODIN) 5-325 MG per tablet 1-2 tablet  1-2 tablet Oral Q4H PRN Prudencio Burly III, PA-C   2 tablet at 10/14/17 7591  . levothyroxine (SYNTHROID, LEVOTHROID) tablet 150 mcg  150 mcg Oral QAC breakfast Patrecia Pour, Christean Grief, MD   150 mcg at 10/14/17 [phone]  . LORazepam (ATIVAN) tablet 1 mg  1 mg Oral Q6H PRN Ainsley Spinner, PA-C       Or  . LORazepam (ATIVAN) injection 1 mg  1 mg Intravenous Q6H PRN Ainsley Spinner, PA-C      . magnesium citrate solution 1 Bottle  1 Bottle Oral Once PRN Prudencio Burly III, PA-C      . magnesium sulfate IVPB 2 g 50 mL  2 g Intravenous Once Dessa Phi Chahn-Yang, DO 50 mL/hr at 10/14/17 0928 2 g at 10/14/17 0928  . menthol-cetylpyridinium (CEPACOL) lozenge 3 mg  1 lozenge Oral PRN Prudencio Burly III, PA-C       Or  . phenol (CHLORASEPTIC) mouth spray 1 spray  1 spray Mouth/Throat PRN Prudencio Burly III, PA-C      . methocarbamol (ROBAXIN) tablet 500 mg  500 mg Oral Q6H PRN Prudencio Burly III, PA-C   500 mg at 10/12/17 2018   Or  . methocarbamol (ROBAXIN) 500 mg in dextrose 5 % 50 mL IVPB  500 mg Intravenous Q6H PRN Prudencio Burly III, PA-C      . morphine 4 MG/ML injection 0.52 mg  0.52 mg Intravenous Q4H PRN Dessa Phi Chahn-Yang, DO      . multivitamin with minerals tablet 1 tablet  1 tablet Oral Daily Rise Patience, MD   1 tablet at 10/14/17 [phone]  . mupirocin ointment (BACTROBAN) 2 % 1 application  1 application Nasal BID Patrecia Pour, Christean Grief, MD   1 application at 93/57/01 0932  . pantoprazole (PROTONIX) EC tablet 40 mg  40 mg Oral BID Patrecia Pour, Christean Grief, MD   40 mg at 10/14/17 7793  . polyethylene glycol (MIRALAX / GLYCOLAX) packet 17 g  17 g Oral Daily PRN Prudencio Burly III, PA-C      . senna (SENOKOT) tablet 8.6 mg  1 tablet Oral BID Prudencio Burly III, PA-C   8.6 mg at 10/14/17 9030  . thiamine (VITAMIN B-1) tablet 100 mg  100 mg Oral Daily [name], RPH   100 mg at 10/14/17 0923   Facility-Administered Medications Ordered in Other Encounters   Medication Dose Route Frequency Provider Last Rate Last Dose  . LORazepam (ATIVAN) injection 0.5 mg  0.5 mg Intravenous Once Volanda Napoleon, MD         Discharge Medications: Please see discharge summary for a list of discharge medications.  Relevant Imaging Results:  Relevant Lab Results:   Additional Information SS#[id number] [place], [place]

## 2017-10-14 NOTE — Progress Notes (Addendum)
Occupational Therapy Treatment Patient Details Name: Susan Mccarthy MRN: [id number] DOB: Jun 22, 1951 Today's Date: 10/14/2017    History of present illness VINCENT EHRLER is a 66 year old female with medical history of alcohol abuse, hypothyroid, metastatic melanoma who came to the ED after patient had a fall at a restaurant.  Upon ED evaluation x-ray showed left hip fracture.  While waiting in the ED patient had one episode of nausea and vomiting which was dark/bloody.  Patient was admitted for hip fracture repair and GI bleed workup. Patient underwent left hip hemiarthroplasty.    OT comments  Pt progressing slowly towards goals, though is self-limiting this session due to pain and appearing increasingly anxious with mobility. Pt completed bed mobility and sit<>stand at RW with MinA +2, declining any further mobility after sit<>stand, initiating returning to sitting and return to supine in bed with physical assist provided to ensure safe transfers/adherence to posterior hip precautions (see general ADL comments below). Pt able to recall 2/3 hip precautions at start of session. Feel SNF level services remains appropriate recommendation after discharge. Will continue to follow acutely to progress Pt's safety and independence with ADLs and mobility.    Follow Up Recommendations  SNF;Supervision/Assistance - 24 hour    Equipment Recommendations  Other (comment)(defer to next venue )          Precautions / Restrictions Precautions Precautions: Posterior Hip Precaution Booklet Issued: (posted in Pt's room) Precaution Comments: Pt able to recall 2/3 precautions this session  Required Braces or Orthoses: Other Brace/Splint(has abduction pillow) Restrictions Weight Bearing Restrictions: Yes LLE Weight Bearing: Weight bearing as tolerated       Mobility Bed Mobility Overal bed mobility: Needs Assistance Bed Mobility: Supine to Sit;Sit to Supine     Supine to sit: Min assist;+2 for  safety/equipment Sit to supine: Mod assist;+2 for physical assistance;+2 for safety/equipment   General bed mobility comments: Pt able to advance LEs towards EOB, assist to bring trunk upright and to maintain L hip precautions during transition; assist for trunk and bil LE support when returning to supine for increased safety and maintenance of posterior hip precautions   Transfers Overall transfer level: Needs assistance Equipment used: Rolling walker (2 wheeled) Transfers: Sit to/from Stand Sit to Stand: Min assist;+2 safety/equipment         General transfer comment: assist to power up and for balance upon standing; cues for safe hand placement    Balance Overall balance assessment: Needs assistance Sitting-balance support: Bilateral upper extremity supported;Feet supported Sitting balance-Leahy Scale: Fair     Standing balance support: Bilateral upper extremity supported Standing balance-Leahy Scale: Poor Standing balance comment: needed UE support to stand                           ADL either performed or assessed with clinical judgement   ADL Overall ADL's : Needs assistance/impaired     Grooming: Set up;Bed level                               Functional mobility during ADLs: Minimal assistance;+2 for physical assistance;+2 for safety/equipment;Rolling walker(for sit<>stand only) General ADL Comments: Pt requiring +2 assist for safety during bed mobility and sit<>stand at RW; upon standing at [facility] informed Pt of plans for further mobility, Pt declining, initiates return to sitting EOB, removing gait belt and initiating return to supine in bed; educated on benefits of OOB  and mobility however Pt continues attempting to return to supine in bed; assisted Pt and positioned in supine, setup provided for grooming ADLs at bed level                        Cognition Arousal/Alertness: Awake/alert Behavior During Therapy: Anxious;Flat affect Overall  Cognitive Status: No family/caregiver present to determine baseline cognitive functioning Area of Impairment: Memory;Following commands;Safety/judgement;Awareness;Problem solving;Attention                   Current Attention Level: Selective Memory: Decreased short-term memory;Decreased recall of precautions Following Commands: Follows one step commands inconsistently;Follows one step commands with increased time Safety/Judgement: Decreased awareness of deficits;Decreased awareness of safety   Problem Solving: Slow processing;Requires verbal cues;Requires tactile cues;Difficulty sequencing General Comments: Pt appears anxious and emotional during session, is self-limiting with mobility, pt stating "everyone keeps pushing me too hard"                          Pertinent Vitals/ Pain       Pain Assessment: Faces Faces Pain Scale: Hurts even more Pain Location: L hip Pain Descriptors / Indicators: Grimacing;Guarding;Sore Pain Intervention(s): Limited activity within patient's tolerance;Monitored during session;Premedicated before session                                                          Frequency  Min 2X/week        Progress Toward Goals  OT Goals(current goals can now be found in the care plan section)  Progress towards OT goals: OT to reassess next treatment  Acute Rehab OT Goals Patient Stated Goal: stop pain OT Goal Formulation: With patient Time For Goal Achievement: 10/26/17 Potential to Achieve Goals: Good  Plan Discharge plan remains appropriate                     AM-PAC PT "6 Clicks" Daily Activity     Outcome Measure   Help from another person eating meals?: None Help from another person taking care of personal grooming?: A Little Help from another person toileting, which includes using toliet, bedpan, or urinal?: A Lot Help from another person bathing (including washing, rinsing, drying)?: A Lot Help  from another person to put on and taking off regular upper body clothing?: A Little Help from another person to put on and taking off regular lower body clothing?: A Lot 6 Click Score: 16    End of Session Equipment Utilized During Treatment: Gait belt;Rolling walker  OT Visit Diagnosis: Unsteadiness on feet (R26.81);Other abnormalities of gait and mobility (R26.89);Muscle weakness (generalized) (M62.81);Pain;History of falling (Z91.81) Pain - Right/Left: Left Pain - part of body: Hip   Activity Tolerance Patient limited by pain;Other (comment)(limited due to Pt increasingly anxious )   Patient Left in bed;with call bell/phone within reach;with bed alarm set   Nurse Communication Mobility status        Time: 3545-6256 OT Time Calculation (min): 20 min  Charges: OT General Charges $OT Visit: 1 Visit OT Treatments $Self Care/Home Management : 8-22 mins  Lou Cal, OT Pager [phone] 10/14/2017    Raymondo Band 10/14/2017, 11:47 AM

## 2017-10-14 NOTE — Social Work (Signed)
CSW made follow up calls to: [place], [facility], [place], and [place], to discuss SNF placements.   CSW will continue to follow up for SNF placement. Pt will need insurance authorization when placement is chosen.   Alexander Mt, LCSWA
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
## 2017-10-14 NOTE — Progress Notes (Signed)
   Assessment / Plan: 3 Days Post-Op  S/P Procedure(s) (LRB): HEMIARTHROPLASTY LEFT HIP (Left) by Dr. Ernesta Amble. Percell Miller on 10/11/17  Principal Problem:   Closed left hip fracture ([place]) Active Problems:   Malignant melanoma, metastatic (HCC)   Hypothyroidism, postop   Hypokalemia   Alcoholic intoxication without complication (HCC)   Hip fracture (HCC)   Left femoral neck fracture from fall Status post left hip hemiarthroplasty Pain controlled.  Tolerating diet.  Mobilizing with therapy.  Minimize narcotics/excess Tylenol. Up with therapy  Incentive Spirometry Apply ice Medical management per primary service  Weight Bearing: Weight Bearing as Tolerated (WBAT) posterior precautions Dressings: Maintain  Mepilex.  VTE prophylaxis: Lovenox, SCDs, ambulation Dispo: Skilled Nursing Facility/Rehab after social work eval and cleared medically.  Stable from an orthopedic perspective.  Plan for follow up in the office with Dr. Alain Marion in 2 weeks.  Please call with questions.    Subjective: Patient reports pain as mild to moderate.  Tolerating diet.   +Flatus.  No CP, SOB.  OOB with therapy.  Objective:   VITALS:   Vitals:   10/12/17 2114 10/13/17 0730 10/13/17 1403 10/13/17 2156  BP: 97/72 95/66 91/64  110/79  Pulse: 93 65 80 72  Resp: 16 16 16 16   Temp: 98.4 F (36.9 C) 97.8 F (36.6 C) 97.8 F (36.6 C) 98.8 F (37.1 C)  TempSrc: Oral Oral Oral Oral  SpO2: 94% 93% 95% 95%  Weight:      Height:       CBC Latest Ref Rng & Units 10/13/2017 10/12/2017 10/10/2017  WBC 4.0 - 10.5 K/uL 3.3(L) 5.3 5.3  Hemoglobin 12.0 - 15.0 g/dL 10.4(L) 11.3(L) 11.6(L)  Hematocrit 36.0 - 46.0 % 31.0(L) 33.5(L) 33.7(L)  Platelets 150 - 400 K/uL 143(L) 158 160   BMP Latest Ref Rng & Units 10/13/2017 10/12/2017 10/10/2017  Glucose 65 - 99 mg/dL 88 144(H) 70  BUN 6 - 20 mg/dL 12 14 11   Creatinine 0.44 - 1.00 mg/dL 0.94 1.02(H) 0.92  Sodium 135 - 145 mmol/L 131(L) 129(L) 134(L)  Potassium  3.5 - 5.1 mmol/L 4.2 4.8 3.0(L)  Chloride 101 - 111 mmol/L 102 100(L) 102  CO2 22 - 32 mmol/L 23 21(L) 22  Calcium 8.9 - 10.3 mg/dL 7.3(L) 8.3(L) 8.0(L)   Intake/Output      11/11 0701 - 11/12 0700   I.V. (mL/kg) 2301.7 (42.3)   Total Intake(mL/kg) 2301.7 (42.3)   Net +2301.7         Physical Exam: General: NAD.  Upright in bed.  Calm, conversant.  No increased work of breathing  MSK Neurovascularly intact Sensation intact distally Intact pulses distally Dorsiflexion/Plantar flexion intact Incision: dressing C/D/I   Prudencio Burly III, PA-C 10/14/2017, 6:51 AM

## 2017-10-15 DIAGNOSIS — F17210 Nicotine dependence, cigarettes, uncomplicated: Secondary | ICD-10-CM | POA: Diagnosis not present

## 2017-10-15 DIAGNOSIS — S72002A Fracture of unspecified part of neck of left femur, initial encounter for closed fracture: Secondary | ICD-10-CM | POA: Diagnosis not present

## 2017-10-15 DIAGNOSIS — R450 Nervousness: Secondary | ICD-10-CM

## 2017-10-15 DIAGNOSIS — F1910 Other psychoactive substance abuse, uncomplicated: Secondary | ICD-10-CM | POA: Diagnosis not present

## 2017-10-15 DIAGNOSIS — F4325 Adjustment disorder with mixed disturbance of emotions and conduct: Secondary | ICD-10-CM | POA: Diagnosis not present

## 2017-10-15 DIAGNOSIS — F321 Major depressive disorder, single episode, moderate: Secondary | ICD-10-CM | POA: Insufficient documentation

## 2017-10-15 DIAGNOSIS — F419 Anxiety disorder, unspecified: Secondary | ICD-10-CM

## 2017-10-15 DIAGNOSIS — Z630 Problems in relationship with spouse or partner: Secondary | ICD-10-CM | POA: Diagnosis not present

## 2017-10-15 DIAGNOSIS — G4700 Insomnia, unspecified: Secondary | ICD-10-CM | POA: Diagnosis not present

## 2017-10-15 LAB — BASIC METABOLIC PANEL
Anion gap: 6 (ref 5–15)
BUN: 11 mg/dL (ref 6–20)
CHLORIDE: 101 mmol/L (ref 101–111)
CO2: 23 mmol/L (ref 22–32)
CREATININE: 0.79 mg/dL (ref 0.44–1.00)
Calcium: 7.7 mg/dL — ABNORMAL LOW (ref 8.9–10.3)
GFR calc Af Amer: >60 mL/min (ref >60)
GFR calc non Af Amer: >60 mL/min (ref >60)
Glucose, Bld: 71 mg/dL (ref 65–99)
Potassium: 3.9 mmol/L (ref 3.5–5.1)
Sodium: 130 mmol/L — ABNORMAL LOW (ref 135–145)

## 2017-10-15 LAB — CBC
HEMATOCRIT: 29.1 % — AB (ref 36.0–46.0)
HEMOGLOBIN: 9.7 g/dL — AB (ref 12.0–15.0)
MCH: 31.4 pg (ref 26.0–34.0)
MCHC: 33.3 g/dL (ref 30.0–36.0)
MCV: 94.2 fL (ref 78.0–100.0)
Platelets: 182 10*3/uL (ref 150–400)
RBC: 3.09 MIL/uL — AB (ref 3.87–5.11)
RDW: 17.6 % — ABNORMAL HIGH (ref 11.5–15.5)
WBC: 1.8 10*3/uL — ABNORMAL LOW (ref 4.0–10.5)

## 2017-10-15 LAB — CALCITRIOL (1,25 DI-OH VIT D): Vit D, 1,25-Dihydroxy: 27.2 pg/mL (ref 19.9–79.3)

## 2017-10-15 LAB — CALCIUM, IONIZED: Calcium, Ionized, Serum: 4.5 mg/dL (ref 4.5–5.6)

## 2017-10-15 NOTE — Social Work (Addendum)
CSW met with patient at bedside. CSW discussed bed offers. Pt has selected [facility].  CSW will f/u with SNF as they will need to obtain Insurance auth for insurance for SNF placement.  10:38 am- CSW was advised by SNF that [facility] does not have a bed. CSW met with patient and patient has acepted offer from [facility].  CSw contacted admission at [facility] to begin [employer].  CSW will continue to follow.  Elissa Hefty, LCSW Clinical Social Worker [phone]

## 2017-10-15 NOTE — Progress Notes (Signed)
[facility] Progress Note  Susan Mccarthy 66 y.o. [birth year]  CC:  hematemesis   Subjective: patient was somewhat upset at the time of interview because of concern for hospital bills. She was not giving  appropriate history but she denied any further bleeding episodes or any other GI symptoms at this time.    Objective: Vital signs in last 24 hours: Vitals:   10/14/17 2126 10/15/17 0512  BP: 98/69 105/72  Pulse: 90 86  Resp: 16 16  Temp: 98 F (36.7 C) 98.6 F (37 C)  SpO2: 97% 96%    Physical Exam:  General:  Alert, somewhat upset appears stated age           Heart:  Regular rate and rhythm, S1, S2 normal  Abdomen:   Soft, non-tender, bowel sounds active all four quadrants,  no masses, no peritoneal signs          Lab Results: Recent Labs    10/13/17 0313 10/14/17 0534 10/15/17 0513  NA 131* 130* 130*  K 4.2 3.9 3.9  CL 102 101 101  CO2 23 24 23   GLUCOSE 88 78 71  BUN 12 10 11   CREATININE 0.94 0.79 0.79  CALCIUM 7.3* 7.6* 7.7*  MG 1.4* 1.6*  --    Recent Labs    10/14/17 0534  AST 29  ALT 11*  ALKPHOS 67  BILITOT 0.5  PROT 4.7*  ALBUMIN 2.5*   Recent Labs    10/14/17 0534 10/15/17 0513  WBC 2.1* 1.8*  HGB 9.3* 9.7*  HCT 27.9* 29.1*  MCV 94.3 94.2  PLT 156 182   No results for input(s): LABPROT, INR in the last 72 hours.    Assessment/Plan: - hematemesis. No further bleeding episode during hospitalization. - anemia. Hemoglobin stable - large hiatal hernia based on CT scan of July 2018 - Left hip fracture status post hemiarthroplasty on 10/11/2017 - History of metastatic melanoma - history of alcohol abuse - constipation. Most likely from  narcotic use  Recommendations ------------------------- - Patient's hemoglobin is stable. Upper GI series to moderate size hiatal hernia with acid reflux but otherwise no acute findings. Patient denied any further bleeding episodes. - No further inpatient workup from GI standpoint. -  GI will sign off. Follow-up with Dr. Paulita Fujita in 3-4 weeks after discharge.  Otis Brace MD, [place] 10/15/2017, 11:01 AM  Contact #  [phone]
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
## 2017-10-15 NOTE — Plan of Care (Signed)
  Activity: Risk for activity intolerance will decrease 10/15/2017 1621 - Progressing by Williams Che, RN   Nutrition: Adequate nutrition will be maintained 10/15/2017 1621 - Progressing by Williams Che, RN   Coping: Level of anxiety will decrease 10/15/2017 1621 - Progressing by Williams Che, RN   Pain Managment: General experience of comfort will improve 10/15/2017 1621 - Progressing by Williams Che, RN   Safety: Ability to remain free from injury will improve 10/15/2017 1621 - Progressing by Williams Che, RN
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
## 2017-10-15 NOTE — Progress Notes (Signed)
Nutrition Follow-up  DOCUMENTATION CODES:   Non-severe (moderate) malnutrition in context of chronic illness  INTERVENTION:   - Ensure Enlive BID; each supplement provides 350 kcals and 20 grams of protein  NUTRITION DIAGNOSIS:   Moderate Malnutrition related to chronic illness(alcoholism) as evidenced by moderate fat depletion, moderate muscle depletion. Ongoing  GOAL:   Patient will meet greater than or equal to 90% of their needs Progressing  MONITOR:   Diet advancement, PO intake, Supplement acceptance, Labs, Weight trends, Skin  ASSESSMENT:   66 yo Female with medical history of alcohol abuse, hypothyroid, metastatic melanoma who came to the ED after patient had a fall at a restaurant.  Upon ED evaluation x-ray showed left hip fracture.  Pt underwent L hip hemiarthroplasty surgery on 11/9 and was advanced to a regular diet. Per chart, pt consumed 100% of breakfast and lunch today. Per chart, pt has not had a BM since 11/6 and is on Colace for constipation.  Pt is now on suicide watch after informing a NT that she wanted to hurt/kill herself d/t disappointment at not being able to discharge to her preferred facility. Upon visit, pt declined speaking with me and asked to return at another time. Pt seemed overwhelmed. Psych staff attending to pt.  Medications- Colace, Lovenox, Folvite, Synthroid, MVI, Protonix, Miralax  Labs- Na 130 (L), Ca 7.7 (L)  Diet Order:  Diet regular Room service appropriate? Yes; Fluid consistency: Thin  EDUCATION NEEDS:   No education needs have been identified at this time  Skin:  Skin Assessment: Reviewed RN Assessment  Last BM:  11/6  Height:   Ht Readings from Last 1 Encounters:  10/10/17 5\' 4"  (1.626 m)    Weight:   Wt Readings from Last 1 Encounters:  10/10/17 120 lb (54.4 kg)    Ideal Body Weight:  54.5 kg  BMI:  Body mass index is 20.6 kg/m.  Estimated Nutritional Needs:   Kcal:  1600-1800  Protein:  70-85  gm  Fluid:  1.6-1.8 [place] [facility] Intern Pager: [phone] 10/15/2017 2:21 PM

## 2017-10-15 NOTE — Progress Notes (Signed)
Informed by nurse tech that Pt stated that she wants to kill herself. Checked on Pt and she stated that she is upset of not being able to go to her preferred facility. MD notified.

## 2017-10-15 NOTE — Social Work (Signed)
CSW followed up with pt this morning to discuss placement at [facility], [facility], or [facility] who have offered placement.   Pt states there she will be making decision by herself. CSW will follow up with placement choice.    Alexander Mt, LCSWA

## 2017-10-16 DIAGNOSIS — S72002A Fracture of unspecified part of neck of left femur, initial encounter for closed fracture: Secondary | ICD-10-CM | POA: Diagnosis not present

## 2017-10-16 DIAGNOSIS — F10920 Alcohol use, unspecified with intoxication, uncomplicated: Secondary | ICD-10-CM

## 2017-10-16 DIAGNOSIS — K920 Hematemesis: Secondary | ICD-10-CM | POA: Diagnosis not present

## 2017-10-16 DIAGNOSIS — F321 Major depressive disorder, single episode, moderate: Secondary | ICD-10-CM

## 2017-10-16 LAB — BASIC METABOLIC PANEL
Anion gap: 6 (ref 5–15)
BUN: 5 mg/dL — AB (ref 6–20)
CHLORIDE: 101 mmol/L (ref 101–111)
CO2: 24 mmol/L (ref 22–32)
CREATININE: 0.73 mg/dL (ref 0.44–1.00)
Calcium: 8.1 mg/dL — ABNORMAL LOW (ref 8.9–10.3)
GFR calc Af Amer: >60 mL/min (ref >60)
GFR calc non Af Amer: >60 mL/min (ref >60)
GLUCOSE: 101 mg/dL — AB (ref 65–99)
Potassium: 3.6 mmol/L (ref 3.5–5.1)
Sodium: 131 mmol/L — ABNORMAL LOW (ref 135–145)

## 2017-10-16 LAB — CBC
HEMATOCRIT: 33.5 % — AB (ref 36.0–46.0)
HEMOGLOBIN: 11.1 g/dL — AB (ref 12.0–15.0)
MCH: 30.6 pg (ref 26.0–34.0)
MCHC: 33.1 g/dL (ref 30.0–36.0)
MCV: 92.3 fL (ref 78.0–100.0)
Platelets: 194 10*3/uL (ref 150–400)
RBC: 3.63 MIL/uL — ABNORMAL LOW (ref 3.87–5.11)
RDW: 16.7 % — ABNORMAL HIGH (ref 11.5–15.5)
WBC: 2.8 10*3/uL — ABNORMAL LOW (ref 4.0–10.5)

## 2017-10-16 MED ORDER — SENNA 8.6 MG PO TABS: 1.0000 | ORAL_TABLET | Freq: Two times a day (BID) | ORAL | 0 refills | Status: AC

## 2017-10-16 MED ORDER — METHOCARBAMOL 500 MG PO TABS: 500.0000 mg | ORAL_TABLET | Freq: Four times a day (QID) | ORAL | 0 refills | Status: AC | PRN

## 2017-10-16 MED ORDER — MIRTAZAPINE 15 MG PO TABS
15.0000 mg | ORAL_TABLET | Freq: Every day | ORAL | Status: DC
  Administered 2017-10-16: 15 mg via ORAL
  Filled 2017-10-16: qty 1

## 2017-10-16 MED ORDER — LEVOTHYROXINE SODIUM 150 MCG PO TABS: 150.0000 ug | ORAL_TABLET | Freq: Every day | ORAL | 0 refills | Status: AC

## 2017-10-16 MED ORDER — POLYETHYLENE GLYCOL 3350 17 G PO PACK: 17.0000 g | PACK | Freq: Every day | ORAL | 0 refills | Status: AC | PRN

## 2017-10-16 MED ORDER — PANTOPRAZOLE SODIUM 40 MG PO TBEC: 40.0000 mg | DELAYED_RELEASE_TABLET | Freq: Every day | ORAL | Status: AC

## 2017-10-16 MED ORDER — MIRTAZAPINE 15 MG PO TABS
15.0000 mg | ORAL_TABLET | Freq: Every day | ORAL | Status: AC
Start: 2017-10-16 — End: ?
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
## 2017-10-16 NOTE — Progress Notes (Signed)
Occupational Therapy Treatment Patient Details Name: Susan Mccarthy MRN: [id number] DOB: [birth year] Today's Date: 10/16/2017    History of present illness Susan Mccarthy is a 66 year old female with medical history of alcohol abuse, hypothyroid, metastatic melanoma who came to the ED after patient had a fall at a restaurant.  Upon ED evaluation x-ray showed left hip fracture.  While waiting in the ED patient had one episode of nausea and vomiting which was dark/bloody.  Patient was admitted for hip fracture repair and GI bleed workup. Patient underwent left hip hemiarthroplasty.    OT comments  Pt making slow progress towards goals. Completed stand pivot transfer EOB to/from w/c with MinA +2-ModA, requires increased time and max encouragement to participate. Education provided regarding safety during ADL completion and AE for completing ADLs while adhering to posterior hip precautions, Pt will benefit from additional education and practice. Feel SNF recommendation remains appropriate at this time, will continue to follow acutely to progress Pt's safety and independence with ADLs and mobility .  Follow Up Recommendations  SNF;Supervision/Assistance - 24 hour    Equipment Recommendations  Other (comment)(defer to next venue )          Precautions / Restrictions Precautions Precautions: Posterior Hip Precaution Comments: Pt able to recall 2/3 precautions this session  Required Braces or Orthoses: Other Brace/Splint(has abduction pillow) Restrictions Weight Bearing Restrictions: Yes LLE Weight Bearing: Weight bearing as tolerated       Mobility Bed Mobility Overal bed mobility: Needs Assistance Bed Mobility: Supine to Sit;Sit to Supine     Supine to sit: Min assist;+2 for safety/equipment Sit to supine: Mod assist   General bed mobility comments: VC's for precautions; Pt able to advance LEs towards EOB with assist using bed pad to scoot hips, assist to bring trunk into upright  position  Transfers Overall transfer level: Needs assistance Equipment used: Rolling walker (2 wheeled) Transfers: Sit to/from Stand Sit to Stand: Mod assist;Min assist;+2 safety/equipment Stand pivot transfers: Min assist       General transfer comment: assist to power up and for balance upon standing; cues for safe hand placement and to position LLE for Post hip prec; initially stood from EOB with MinA +2, stood from w/c with ModA end of session    Balance Overall balance assessment: Needs assistance Sitting-balance support: Bilateral upper extremity supported;Feet supported Sitting balance-Leahy Scale: Fair     Standing balance support: Bilateral upper extremity supported Standing balance-Leahy Scale: Poor Standing balance comment: needed UE support to stand                           ADL either performed or assessed with clinical judgement   ADL Overall ADL's : Needs assistance/impaired                                       General ADL Comments: Pt continues to have increased anxiety, both with mobility and appearing to be baseline anxiety, requesting to get out of room; Pt completed stand pivot to w/c and transported to lobby area, while in lobby discussion held on hip precautions and AE for completing ADLs while adhering to precautions, will plan to practice with use of AE during next treatment session                        Cognition Arousal/Alertness: Awake/alert  Behavior During Therapy: Anxious;Flat affect Overall Cognitive Status: No family/caregiver present to determine baseline cognitive functioning Area of Impairment: Memory;Following commands;Safety/judgement;Awareness;Problem solving;Attention                   Current Attention Level: Selective Memory: Decreased short-term memory;Decreased recall of precautions Following Commands: Follows one step commands inconsistently;Follows one step commands with increased  time Safety/Judgement: Decreased awareness of deficits;Decreased awareness of safety Awareness: Emergent Problem Solving: Slow processing;Requires verbal cues;Requires tactile cues;Difficulty sequencing General Comments: Pt anxious and emotional during session, is self-limiting with mobility                          Pertinent Vitals/ Pain       Pain Assessment: Faces Faces Pain Scale: Hurts whole lot Pain Location: L hip Pain Descriptors / Indicators: Grimacing;Guarding;Sore Pain Intervention(s): Limited activity within patient's tolerance;Monitored during session;Repositioned                                                          Frequency  Min 2X/week        Progress Toward Goals  OT Goals(current goals can now be found in the care plan section)  Progress towards OT goals: Progressing toward goals(slowly)  Acute Rehab OT Goals Patient Stated Goal: stop pain OT Goal Formulation: With patient Time For Goal Achievement: 10/26/17 Potential to Achieve Goals: Good  Plan Discharge plan remains appropriate                     AM-PAC PT "6 Clicks" Daily Activity     Outcome Measure   Help from another person eating meals?: None Help from another person taking care of personal grooming?: A Little Help from another person toileting, which includes using toliet, bedpan, or urinal?: A Lot Help from another person bathing (including washing, rinsing, drying)?: A Lot Help from another person to put on and taking off regular upper body clothing?: A Little Help from another person to put on and taking off regular lower body clothing?: A Lot 6 Click Score: 16    End of Session Equipment Utilized During Treatment: Gait belt;Rolling walker  OT Visit Diagnosis: Unsteadiness on feet (R26.81);Other abnormalities of gait and mobility (R26.89);Muscle weakness (generalized) (M62.81);Pain;History of falling (Z91.81) Pain - Right/Left: Left Pain  - part of body: Hip   Activity Tolerance Patient tolerated treatment well;Other (comment)(self-limiting due to increased anxiety )   Patient Left in bed;with call bell/phone within reach   Nurse Communication Mobility status        Time: 1553-1630 OT Time Calculation (min): 37 min  Charges: OT General Charges $OT Visit: 1 Visit OT Treatments $Therapeutic Activity: 23-37 mins  Lou Cal, [place] Pager [phone] 10/16/2017    Raymondo Band 10/16/2017, 5:08 PM

## 2017-10-16 NOTE — Social Work (Signed)
CSW following up on bed offers. CSW was advised this morning that [facility] can no longer offer a bed.  CSW f/u with other SNF's to see if they can offer a bed. Once offers received, CSW will discuss with patient as the SNF will need to obtain insurance auth.  Doctor is looking to dc patient today. CSW f/u on same.  Elissa Hefty, LCSW Clinical Social Worker [phone]

## 2017-10-16 NOTE — Progress Notes (Signed)
Physical Therapy Treatment Patient Details Name: Susan Mccarthy MRN: [id number] DOB: November 04, 1951 Today's Date: 10/16/2017    History of Present Illness Susan Mccarthy is a 65 year old female with medical history of alcohol abuse, hypothyroid, metastatic melanoma who came to the ED after patient had a fall at a restaurant.  Upon ED evaluation x-ray showed left hip fracture.  While waiting in the ED patient had one episode of nausea and vomiting which was dark/bloody.  Patient was admitted for hip fracture repair and GI bleed workup. Patient underwent left hip hemiarthroplasty.     PT Comments    Pt limited by anxiety this session. Max encouragement was provided to progress mobility, however pt became somewhat agitated and began impulsively attempting to get up without an AD, breaking her hip precautions on several occasions. Continue to feel that SNF is the most appropriate d/c disposition. Will continue to follow and progress as able per POC.   Follow Up Recommendations  SNF;Supervision/Assistance - 24 hour     Equipment Recommendations  Rolling walker with 5" wheels    Recommendations for Other Services       Precautions / Restrictions Precautions Precautions: Posterior Hip Precaution Booklet Issued: (posted in Pt's room) Precaution Comments: Pt able to recall 2/3 precautions this session  Required Braces or Orthoses: Other Brace/Splint(has abduction pillow) Restrictions Weight Bearing Restrictions: Yes LLE Weight Bearing: Weight bearing as tolerated    Mobility  Bed Mobility Overal bed mobility: Needs Assistance Bed Mobility: Supine to Sit;Sit to Supine     Supine to sit: Min assist;+2 for safety/equipment Sit to supine: Min assist;+2 for safety/equipment   General bed mobility comments: VC's for maintenance of precautions. Pt was able to advance LE's towards EOB however attempts to roll onto her side with use of the rail. Pt cued to avoid this due to precautions, as she  was internally rotating and adducting the LLE - pt appears frustrated with correction.   Transfers Overall transfer level: Needs assistance Equipment used: Rolling walker (2 wheeled) Transfers: Sit to/from Stand Sit to Stand: Min assist;+2 safety/equipment Stand pivot transfers: Min assist       General transfer comment: assist to power up and for balance upon standing; cues for safe hand placement and to position LLE for Post hip prec  Ambulation/Gait Ambulation/Gait assistance: Min assist;+2 safety/equipment Ambulation Distance (Feet): 15 Feet Assistive device: Rolling walker (2 wheeled) Gait Pattern/deviations: Step-through pattern;Decreased stance time - left;Decreased step length - right;Decreased step length - left;Decreased weight shift to left;Trunk flexed Gait velocity: decreased Gait velocity interpretation: Below normal speed for age/gender General Gait Details: As soon as pt entered the hallway she immediately ended gait training. Pt repeating "Too many people, too many people". Attempted to calm and redirect pt, however she became slightly agitated and demanded return to room. PT encouraged her to walk back to the room but pt declined.   Stairs            Wheelchair Mobility    Modified Rankin (Stroke Patients Only)       Balance Overall balance assessment: Needs assistance Sitting-balance support: Bilateral upper extremity supported;Feet supported Sitting balance-Leahy Scale: Fair     Standing balance support: Bilateral upper extremity supported Standing balance-Leahy Scale: Poor Standing balance comment: needed UE support to stand                            Cognition Arousal/Alertness: Awake/alert Behavior During Therapy: Anxious;Flat affect Overall Cognitive  Status: No family/caregiver present to determine baseline cognitive functioning Area of Impairment: Memory;Following commands;Safety/judgement;Awareness;Problem solving;Attention                    Current Attention Level: Selective Memory: Decreased short-term memory;Decreased recall of precautions Following Commands: Follows one step commands inconsistently;Follows one step commands with increased time Safety/Judgement: Decreased awareness of deficits;Decreased awareness of safety Awareness: Emergent Problem Solving: Slow processing;Requires verbal cues;Requires tactile cues;Difficulty sequencing General Comments: Pt anxious and emotional during session, is self-limiting with mobility      Exercises      General Comments General comments (skin integrity, edema, etc.): Pt refused to sit in the chair, and returned to bed at end of session.       Pertinent Vitals/Pain Pain Assessment: Faces Faces Pain Scale: Hurts even more Pain Location: L hip Pain Descriptors / Indicators: Grimacing;Guarding;Sore Pain Intervention(s): Limited activity within patient's tolerance;Monitored during session;Repositioned    Home Living                      Prior Function            PT Goals (current goals can now be found in the care plan section) Acute Rehab PT Goals Patient Stated Goal: stop pain PT Goal Formulation: With patient Time For Goal Achievement: 10/19/17 Potential to Achieve Goals: Fair Progress towards PT goals: Progressing toward goals    Frequency    Min 5X/week      PT Plan Current plan remains appropriate    Co-evaluation              AM-PAC PT "6 Clicks" Daily Activity  Outcome Measure  Difficulty turning over in bed (including adjusting bedclothes, sheets and blankets)?: Unable Difficulty moving from lying on back to sitting on the side of the bed? : Unable Difficulty sitting down on and standing up from a chair with arms (e.g., wheelchair, bedside commode, etc,.)?: Unable Help needed moving to and from a bed to chair (including a wheelchair)?: A Little Help needed walking in hospital room?: A Little Help needed  climbing 3-5 steps with a railing? : A Lot 6 Click Score: 11    End of Session Equipment Utilized During Treatment: Gait belt Activity Tolerance: Treatment limited secondary to agitation;Other (comment)(Anxiety) Patient left: in bed;with call bell/phone within reach;with bed alarm set Nurse Communication: Mobility status PT Visit Diagnosis: Unsteadiness on feet (R26.81);Muscle weakness (generalized) (M62.81);Difficulty in walking, not elsewhere classified (R26.2);Pain Pain - Right/Left: Left Pain - part of body: Hip     Time: 3151-7616 PT Time Calculation (min) (ACUTE ONLY): 20 min  Charges:  $Gait Training: 8-22 mins                    G Codes:       Rolinda Roan, PT, DPT Acute Rehabilitation Services Pager: [phone]    Thelma Comp 10/16/2017, 10:15 AM

## 2017-10-16 NOTE — Plan of Care (Signed)
  Pain Managment: General experience of comfort will improve 10/16/2017 0230 - Progressing by Janee Morn, RN   Safety: Ability to remain free from injury will improve 10/16/2017 0230 - Progressing by Janee Morn, RN 10/16/2017 0230 - Progressing by Janee Morn, RN

## 2017-10-16 NOTE — Plan of Care (Signed)
  Progressing Education: Knowledge of General Education information will improve 10/16/2017 1514 - Progressing by Rance Muir, RN Health Behavior/Discharge Planning: Ability to manage health-related needs will improve 10/16/2017 1514 - Progressing by Rance Muir, RN Clinical Measurements: Ability to maintain clinical measurements within normal limits will improve 10/16/2017 1514 - Progressing by Rance Muir, RN Will remain free from infection 10/16/2017 1514 - Progressing by Rance Muir, RN Diagnostic test results will improve 10/16/2017 1514 - Progressing by Rance Muir, RN Respiratory complications will improve 10/16/2017 1514 - Progressing by Rance Muir, RN Cardiovascular complication will be avoided 10/16/2017 1514 - Progressing by Rance Muir, RN Activity: Risk for activity intolerance will decrease 10/16/2017 1514 - Progressing by Rance Muir, RN Nutrition: Adequate nutrition will be maintained 10/16/2017 1514 - Progressing by Rance Muir, RN Coping: Level of anxiety will decrease 10/16/2017 1514 - Progressing by Rance Muir, RN Elimination: Will not experience complications related to bowel motility 10/16/2017 1514 - Progressing by Rance Muir, RN Will not experience complications related to urinary retention 10/16/2017 1514 - Progressing by Rance Muir, RN Pain Managment: General experience of comfort will improve 10/16/2017 1514 - Progressing by Rance Muir, RN Safety: Ability to remain free from injury will improve 10/16/2017 1514 - Progressing by Rance Muir, RN Skin Integrity: Risk for impaired skin integrity will decrease 10/16/2017 1514 - Progressing by Rance Muir, RN

## 2017-10-16 NOTE — Social Work (Signed)
CSW contacted [facility] and they admission staff indicated that they can offer patient a bed. CSW advised that patient will accept bed.  SNF will initiate Civil Service fast streamer for [employer].  CSW will continue to follow as Insurance may not be approved today.  Elissa Hefty, LCSW Clinical Social Worker [phone]

## 2017-10-17 ENCOUNTER — Encounter (HOSPITAL_COMMUNITY): Payer: Self-pay

## 2017-10-17 MED ORDER — THIAMINE HCL 100 MG PO TABS: 100.0000 mg | ORAL_TABLET | Freq: Every day | ORAL | Status: AC

## 2017-10-17 NOTE — Social Work (Signed)
CSW met with patient at bedside to discuss placement at [facility], and that insurance authorization is pending.   Patient was tearful, but verbally accepted that she is in agreement with her placement at [facility].  CSW will follow up if needed.    Alexander Mt, LCSWA

## 2017-10-17 NOTE — Social Work (Signed)
CSW received confirmation from SNF that they have received Insurance Auth for SNF placement.  CSW will f/u for transition to [facility].  Elissa Hefty, LCSW Clinical Social Worker [phone]

## 2017-10-17 NOTE — Discharge Planning (Signed)
IV removed. RN assessment and VS revealed stability for DC to [facility].  Report called and S/W Cassie Freer, LPN. Discharge papers printed, signed, explained, educated and placed in packet for facility. Informed of suggested FU appts and appts made. Scripts printed, signed and also placed in packet. EMS called and transported to [facility], [place].

## 2017-10-17 NOTE — Clinical Social Work Placement (Signed)
   CLINICAL SOCIAL WORK PLACEMENT  NOTE  Date:  10/17/2017  Patient Details  Name: Susan Mccarthy MRN: [id number] Date of Birth: [birth year]  Clinical Social Work is seeking post-discharge placement for this patient at the [facility] level of care (*CSW will initial, date and re-position this form in  chart as items are completed):  Yes   Patient/family provided with [facility] Work Department's list of facilities offering this level of care within the geographic area requested by the patient (or if unable, by the patient's family).  Yes   Patient/family informed of their freedom to choose among providers that offer the needed level of care, that participate in Medicare, Medicaid or managed care program needed by the patient, have an available bed and are willing to accept the patient.  Yes   Patient/family informed of [facility]'s ownership interest in [facility] and [facility], as well as of the fact that they are under no obligation to receive care at these facilities.  PASRR submitted to EDS on       PASRR number received on 10/14/17     Existing PASRR number confirmed on       FL2 transmitted to all facilities in geographic area requested by pt/family on 10/14/17     FL2 transmitted to all facilities within larger geographic area on       Patient informed that his/her managed care company has contracts with or will negotiate with certain facilities, including the following:        Yes   Patient/family informed of bed offers received.  Patient chooses bed at [facility]     Physician recommends and patient chooses bed at      Patient to be transferred to [facility] on 10/17/17.  Patient to be transferred to facility by [facility]     Patient family notified on 10/17/17 of transfer.  Name of family member notified:  pt responsible for self     PHYSICIAN Please prepare prescriptions     Additional Comment:     _______________________________________________ Normajean Baxter, LCSW 10/17/2017, 12:11 PM

## 2017-10-17 NOTE — Social Work (Signed)
Clinical Social Worker facilitated patient discharge including contacting patient family and facility to confirm patient discharge plans.  Clinical information faxed to facility and family agreeable with plan.    CSW arranged ambulance transport via [facility] to [facility].    RN to call [phone] to give report prior to discharge. Pt will go to Room [facility]P on [facility].  Clinical Social Worker will sign off for now as social work intervention is no longer needed. Please consult [place] again if new need arises.  Elissa Hefty, LCSW Clinical Social Worker [phone]

## 2018-04-02 DEATH — deceased
